# Patient Record
Sex: Male | Born: 1940 | Race: White | Hispanic: No | Marital: Married | Smoking: Former smoker
Health system: Southern US, Community
[De-identification: ages and names within clinical notes are randomized; demographics above are authoritative.]

## PROBLEM LIST (undated history)

## (undated) DIAGNOSIS — K219 Gastro-esophageal reflux disease without esophagitis: Secondary | ICD-10-CM

## (undated) DIAGNOSIS — H3530 Unspecified macular degeneration: Secondary | ICD-10-CM

## (undated) DIAGNOSIS — Z860100 Personal history of colon polyps, unspecified: Secondary | ICD-10-CM

## (undated) DIAGNOSIS — I499 Cardiac arrhythmia, unspecified: Secondary | ICD-10-CM

## (undated) DIAGNOSIS — H547 Unspecified visual loss: Secondary | ICD-10-CM

## (undated) DIAGNOSIS — Z87442 Personal history of urinary calculi: Secondary | ICD-10-CM

## (undated) DIAGNOSIS — E785 Hyperlipidemia, unspecified: Secondary | ICD-10-CM

## (undated) DIAGNOSIS — Z720 Tobacco use: Secondary | ICD-10-CM

## (undated) DIAGNOSIS — I739 Peripheral vascular disease, unspecified: Secondary | ICD-10-CM

## (undated) DIAGNOSIS — I4892 Unspecified atrial flutter: Secondary | ICD-10-CM

## (undated) DIAGNOSIS — N200 Calculus of kidney: Secondary | ICD-10-CM

## (undated) DIAGNOSIS — I679 Cerebrovascular disease, unspecified: Secondary | ICD-10-CM

## (undated) DIAGNOSIS — Z7901 Long term (current) use of anticoagulants: Secondary | ICD-10-CM

## (undated) DIAGNOSIS — I10 Essential (primary) hypertension: Secondary | ICD-10-CM

## (undated) HISTORY — DX: Essential (primary) hypertension: I10

## (undated) HISTORY — PX: CYSTOSCOPY W/ URETERAL STENT PLACEMENT: SHX1429

## (undated) HISTORY — DX: Unspecified atrial flutter: I48.92

## (undated) HISTORY — DX: Cerebrovascular disease, unspecified: I67.9

## (undated) HISTORY — DX: Tobacco use: Z72.0

## (undated) HISTORY — DX: Gastro-esophageal reflux disease without esophagitis: K21.9

## (undated) HISTORY — DX: Long term (current) use of anticoagulants: Z79.01

## (undated) HISTORY — DX: Hyperlipidemia, unspecified: E78.5

## (undated) HISTORY — DX: Unspecified macular degeneration: H35.30

## (undated) HISTORY — DX: Calculus of kidney: N20.0

## (undated) HISTORY — DX: Personal history of colonic polyps: Z86.010

## (undated) HISTORY — DX: Personal history of colon polyps, unspecified: Z86.0100

---

## 2001-02-03 ENCOUNTER — Ambulatory Visit (HOSPITAL_COMMUNITY): Admission: RE | Admit: 2001-02-03 | Discharge: 2001-02-03 | Payer: Self-pay | Admitting: Internal Medicine

## 2002-03-12 ENCOUNTER — Encounter: Payer: Self-pay | Admitting: *Deleted

## 2002-03-12 ENCOUNTER — Encounter: Payer: Self-pay | Admitting: Urology

## 2002-03-12 ENCOUNTER — Emergency Department (HOSPITAL_COMMUNITY): Admission: EM | Admit: 2002-03-12 | Discharge: 2002-03-12 | Payer: Self-pay | Admitting: *Deleted

## 2002-03-12 ENCOUNTER — Ambulatory Visit (HOSPITAL_COMMUNITY): Admission: RE | Admit: 2002-03-12 | Discharge: 2002-03-12 | Payer: Self-pay | Admitting: Urology

## 2002-06-02 ENCOUNTER — Emergency Department (HOSPITAL_COMMUNITY): Admission: EM | Admit: 2002-06-02 | Discharge: 2002-06-02 | Payer: Self-pay | Admitting: Emergency Medicine

## 2002-06-02 ENCOUNTER — Encounter: Payer: Self-pay | Admitting: Emergency Medicine

## 2002-07-16 ENCOUNTER — Encounter: Payer: Self-pay | Admitting: Urology

## 2002-07-16 ENCOUNTER — Ambulatory Visit (HOSPITAL_COMMUNITY): Admission: RE | Admit: 2002-07-16 | Discharge: 2002-07-16 | Payer: Self-pay | Admitting: Urology

## 2002-07-27 ENCOUNTER — Emergency Department (HOSPITAL_COMMUNITY): Admission: EM | Admit: 2002-07-27 | Discharge: 2002-07-27 | Payer: Self-pay | Admitting: Emergency Medicine

## 2002-08-06 ENCOUNTER — Ambulatory Visit (HOSPITAL_COMMUNITY): Admission: RE | Admit: 2002-08-06 | Discharge: 2002-08-06 | Payer: Self-pay | Admitting: Urology

## 2002-08-06 ENCOUNTER — Encounter: Payer: Self-pay | Admitting: Urology

## 2002-08-11 ENCOUNTER — Encounter: Payer: Self-pay | Admitting: Urology

## 2002-08-11 ENCOUNTER — Ambulatory Visit (HOSPITAL_COMMUNITY): Admission: RE | Admit: 2002-08-11 | Discharge: 2002-08-11 | Payer: Self-pay | Admitting: Urology

## 2002-08-12 ENCOUNTER — Ambulatory Visit (HOSPITAL_COMMUNITY): Admission: RE | Admit: 2002-08-12 | Discharge: 2002-08-12 | Payer: Self-pay | Admitting: Urology

## 2002-08-12 ENCOUNTER — Encounter: Payer: Self-pay | Admitting: Urology

## 2002-09-08 ENCOUNTER — Ambulatory Visit (HOSPITAL_COMMUNITY): Admission: RE | Admit: 2002-09-08 | Discharge: 2002-09-08 | Payer: Self-pay | Admitting: Urology

## 2002-09-08 ENCOUNTER — Encounter: Payer: Self-pay | Admitting: Urology

## 2002-09-22 ENCOUNTER — Encounter: Payer: Self-pay | Admitting: Urology

## 2002-09-22 ENCOUNTER — Ambulatory Visit (HOSPITAL_COMMUNITY): Admission: RE | Admit: 2002-09-22 | Discharge: 2002-09-22 | Payer: Self-pay | Admitting: Urology

## 2002-11-10 ENCOUNTER — Observation Stay (HOSPITAL_COMMUNITY): Admission: EM | Admit: 2002-11-10 | Discharge: 2002-11-11 | Payer: Self-pay | Admitting: Emergency Medicine

## 2002-11-10 ENCOUNTER — Encounter: Payer: Self-pay | Admitting: Emergency Medicine

## 2002-11-11 ENCOUNTER — Encounter: Payer: Self-pay | Admitting: *Deleted

## 2002-12-03 ENCOUNTER — Ambulatory Visit (HOSPITAL_COMMUNITY): Admission: RE | Admit: 2002-12-03 | Discharge: 2002-12-03 | Payer: Self-pay | Admitting: Cardiology

## 2003-12-30 ENCOUNTER — Ambulatory Visit (HOSPITAL_COMMUNITY): Admission: RE | Admit: 2003-12-30 | Discharge: 2003-12-30 | Payer: Self-pay | Admitting: Urology

## 2004-01-04 ENCOUNTER — Ambulatory Visit (HOSPITAL_COMMUNITY): Admission: RE | Admit: 2004-01-04 | Discharge: 2004-01-04 | Payer: Self-pay | Admitting: Family Medicine

## 2004-01-17 ENCOUNTER — Ambulatory Visit (HOSPITAL_COMMUNITY): Admission: RE | Admit: 2004-01-17 | Discharge: 2004-01-17 | Payer: Self-pay | Admitting: Family Medicine

## 2004-08-09 ENCOUNTER — Ambulatory Visit (HOSPITAL_COMMUNITY): Admission: RE | Admit: 2004-08-09 | Discharge: 2004-08-09 | Payer: Self-pay | Admitting: Urology

## 2005-01-07 ENCOUNTER — Ambulatory Visit: Payer: Self-pay | Admitting: Cardiology

## 2005-10-04 ENCOUNTER — Ambulatory Visit (HOSPITAL_COMMUNITY): Admission: RE | Admit: 2005-10-04 | Discharge: 2005-10-04 | Payer: Self-pay | Admitting: Urology

## 2006-02-05 ENCOUNTER — Ambulatory Visit (HOSPITAL_COMMUNITY): Admission: RE | Admit: 2006-02-05 | Discharge: 2006-02-05 | Payer: Self-pay | Admitting: Family Medicine

## 2006-02-11 ENCOUNTER — Ambulatory Visit (HOSPITAL_COMMUNITY): Admission: RE | Admit: 2006-02-11 | Discharge: 2006-02-11 | Payer: Self-pay | Admitting: Internal Medicine

## 2006-02-11 ENCOUNTER — Encounter (INDEPENDENT_AMBULATORY_CARE_PROVIDER_SITE_OTHER): Payer: Self-pay | Admitting: *Deleted

## 2006-02-11 ENCOUNTER — Ambulatory Visit: Payer: Self-pay | Admitting: Internal Medicine

## 2006-03-04 DIAGNOSIS — I679 Cerebrovascular disease, unspecified: Secondary | ICD-10-CM

## 2006-03-04 HISTORY — PX: CAROTID ENDARTERECTOMY: SUR193

## 2006-03-04 HISTORY — DX: Cerebrovascular disease, unspecified: I67.9

## 2006-05-13 ENCOUNTER — Ambulatory Visit: Payer: Self-pay | Admitting: Vascular Surgery

## 2006-05-13 ENCOUNTER — Inpatient Hospital Stay (HOSPITAL_COMMUNITY): Admission: AD | Admit: 2006-05-13 | Discharge: 2006-05-16 | Payer: Self-pay | Admitting: Cardiovascular Disease

## 2006-05-14 ENCOUNTER — Encounter (INDEPENDENT_AMBULATORY_CARE_PROVIDER_SITE_OTHER): Payer: Self-pay | Admitting: Specialist

## 2006-06-04 ENCOUNTER — Ambulatory Visit: Payer: Self-pay | Admitting: Vascular Surgery

## 2006-10-30 ENCOUNTER — Ambulatory Visit (HOSPITAL_COMMUNITY): Admission: RE | Admit: 2006-10-30 | Discharge: 2006-10-30 | Payer: Self-pay | Admitting: Family Medicine

## 2006-12-02 ENCOUNTER — Ambulatory Visit: Payer: Self-pay | Admitting: Vascular Surgery

## 2007-09-02 ENCOUNTER — Ambulatory Visit (HOSPITAL_COMMUNITY): Admission: RE | Admit: 2007-09-02 | Discharge: 2007-09-02 | Payer: Self-pay | Admitting: Family Medicine

## 2009-02-23 ENCOUNTER — Ambulatory Visit (HOSPITAL_COMMUNITY): Admission: RE | Admit: 2009-02-23 | Discharge: 2009-02-23 | Payer: Self-pay | Admitting: Family Medicine

## 2010-03-19 ENCOUNTER — Ambulatory Visit (HOSPITAL_COMMUNITY)
Admission: RE | Admit: 2010-03-19 | Discharge: 2010-03-19 | Payer: Self-pay | Source: Home / Self Care | Attending: Family Medicine | Admitting: Family Medicine

## 2010-06-03 DIAGNOSIS — I4892 Unspecified atrial flutter: Secondary | ICD-10-CM

## 2010-06-03 HISTORY — DX: Unspecified atrial flutter: I48.92

## 2010-06-22 ENCOUNTER — Inpatient Hospital Stay (HOSPITAL_COMMUNITY)
Admission: EM | Admit: 2010-06-22 | Discharge: 2010-06-23 | DRG: 138 | Disposition: A | Discharge status: Home / Self Care | Payer: BLUE CROSS/BLUE SHIELD | Attending: Internal Medicine | Admitting: Internal Medicine

## 2010-06-22 ENCOUNTER — Emergency Department (HOSPITAL_COMMUNITY): Payer: BLUE CROSS/BLUE SHIELD

## 2010-06-22 ENCOUNTER — Encounter: Payer: Self-pay | Admitting: Cardiology

## 2010-06-22 DIAGNOSIS — F17200 Nicotine dependence, unspecified, uncomplicated: Secondary | ICD-10-CM | POA: Diagnosis present

## 2010-06-22 DIAGNOSIS — Z7901 Long term (current) use of anticoagulants: Secondary | ICD-10-CM

## 2010-06-22 DIAGNOSIS — E876 Hypokalemia: Secondary | ICD-10-CM | POA: Diagnosis present

## 2010-06-22 DIAGNOSIS — I9589 Other hypotension: Secondary | ICD-10-CM | POA: Diagnosis present

## 2010-06-22 DIAGNOSIS — I4891 Unspecified atrial fibrillation: Secondary | ICD-10-CM

## 2010-06-22 DIAGNOSIS — I6529 Occlusion and stenosis of unspecified carotid artery: Secondary | ICD-10-CM | POA: Diagnosis present

## 2010-06-22 DIAGNOSIS — Z8673 Personal history of transient ischemic attack (TIA), and cerebral infarction without residual deficits: Secondary | ICD-10-CM

## 2010-06-22 DIAGNOSIS — I10 Essential (primary) hypertension: Secondary | ICD-10-CM | POA: Diagnosis present

## 2010-06-22 DIAGNOSIS — E860 Dehydration: Secondary | ICD-10-CM | POA: Diagnosis present

## 2010-06-22 DIAGNOSIS — I4892 Unspecified atrial flutter: Secondary | ICD-10-CM | POA: Diagnosis present

## 2010-06-22 DIAGNOSIS — T46905A Adverse effect of unspecified agents primarily affecting the cardiovascular system, initial encounter: Secondary | ICD-10-CM | POA: Diagnosis present

## 2010-06-22 DIAGNOSIS — H3530 Unspecified macular degeneration: Secondary | ICD-10-CM | POA: Diagnosis present

## 2010-06-22 DIAGNOSIS — E785 Hyperlipidemia, unspecified: Secondary | ICD-10-CM | POA: Diagnosis present

## 2010-06-22 LAB — DIFFERENTIAL
Basophils Absolute: 0 10*3/uL (ref 0.0–0.1)
Eosinophils Relative: 2 % (ref 0–5)
Lymphocytes Relative: 37 % (ref 12–46)
Lymphs Abs: 3.3 10*3/uL (ref 0.7–4.0)
Monocytes Absolute: 0.6 10*3/uL (ref 0.1–1.0)
Neutro Abs: 4.9 10*3/uL (ref 1.7–7.7)
Neutrophils Relative %: 55 % (ref 43–77)

## 2010-06-22 LAB — CARDIAC PANEL(CRET KIN+CKTOT+MB+TROPI)
Relative Index: INVALID (ref 0.0–2.5)
Total CK: 86 U/L (ref 7–232)
Troponin I: 0.01 ng/mL (ref 0.00–0.06)

## 2010-06-22 LAB — CBC
HCT: 40.6 % (ref 39.0–52.0)
Hemoglobin: 14.1 g/dL (ref 13.0–17.0)
MCH: 32.3 pg (ref 26.0–34.0)
MCHC: 34.7 g/dL (ref 30.0–36.0)
MCV: 93.1 fL (ref 78.0–100.0)
RDW: 14.1 % (ref 11.5–15.5)
WBC: 9 10*3/uL (ref 4.0–10.5)

## 2010-06-22 LAB — BASIC METABOLIC PANEL
BUN: 26 mg/dL — ABNORMAL HIGH (ref 6–23)
CO2: 26 mEq/L (ref 19–32)
Calcium: 8.9 mg/dL (ref 8.4–10.5)
Chloride: 106 mEq/L (ref 96–112)
GFR calc Af Amer: >60 mL/min (ref >60)
GFR calc non Af Amer: >60 mL/min (ref >60)
Glucose, Bld: 94 mg/dL (ref 70–99)
Potassium: 3.1 mEq/L — ABNORMAL LOW (ref 3.5–5.1)
Sodium: 138 mEq/L (ref 135–145)

## 2010-06-22 LAB — POCT CARDIAC MARKERS
CKMB, poc: <1 ng/mL — ABNORMAL LOW (ref 1.0–8.0)
Myoglobin, poc: 69.4 ng/mL (ref 12–200)

## 2010-06-23 LAB — CARDIAC PANEL(CRET KIN+CKTOT+MB+TROPI)
CK, MB: 1.4 ng/mL (ref 0.3–4.0)
Relative Index: 1.3 (ref 0.0–2.5)
Troponin I: 0.01 ng/mL (ref 0.00–0.06)
Troponin I: 0.02 ng/mL (ref 0.00–0.06)

## 2010-06-23 LAB — COMPREHENSIVE METABOLIC PANEL
Albumin: 3.3 g/dL — ABNORMAL LOW (ref 3.5–5.2)
BUN: 21 mg/dL (ref 6–23)
Calcium: 8.8 mg/dL (ref 8.4–10.5)
Glucose, Bld: 88 mg/dL (ref 70–99)
Potassium: 3.6 mEq/L (ref 3.5–5.1)
Sodium: 138 mEq/L (ref 135–145)
Total Protein: 5.7 g/dL — ABNORMAL LOW (ref 6.0–8.3)

## 2010-06-23 LAB — CBC
Hemoglobin: 13.1 g/dL (ref 13.0–17.0)
MCH: 32.3 pg (ref 26.0–34.0)
MCHC: 34.4 g/dL (ref 30.0–36.0)
Platelets: 162 10*3/uL (ref 150–400)
RBC: 4.05 MIL/uL — ABNORMAL LOW (ref 4.22–5.81)

## 2010-06-23 LAB — DIFFERENTIAL
Basophils Absolute: 0 10*3/uL (ref 0.0–0.1)
Basophils Relative: 0 % (ref 0–1)
Eosinophils Absolute: 0.2 10*3/uL (ref 0.0–0.7)
Monocytes Relative: 5 % (ref 3–12)
Neutro Abs: 4.1 10*3/uL (ref 1.7–7.7)
Neutrophils Relative %: 43 % (ref 43–77)

## 2010-06-23 LAB — LIPID PANEL
HDL: 26 mg/dL — ABNORMAL LOW (ref >39)
Total CHOL/HDL Ratio: 8 RATIO
Triglycerides: 261 mg/dL — ABNORMAL HIGH (ref <150)
VLDL: 52 mg/dL — ABNORMAL HIGH (ref 0–40)

## 2010-06-23 LAB — PROTIME-INR
INR: 1.07 (ref 0.00–1.49)
Prothrombin Time: 14.1 seconds (ref 11.6–15.2)

## 2010-06-23 LAB — TSH: TSH: 5.684 u[IU]/mL — ABNORMAL HIGH (ref 0.350–4.500)

## 2010-06-23 LAB — MAGNESIUM: Magnesium: 1.8 mg/dL (ref 1.5–2.5)

## 2010-06-23 NOTE — H&P (Signed)
NAME:  Daniel Morse, Daniel Morse             ACCOUNT NO.:  [id number]  MEDICAL RECORD NO.:  [id number]           PATIENT TYPE:  I  LOCATION:  A327                          FACILITY:  [facility]  PHYSICIAN:  Doree Albee, M.D.DATE OF BIRTH:  08-25-1940  DATE OF ADMISSION:  06/22/2010 DATE OF DISCHARGE:  04/20/2012LH                             HISTORY & PHYSICAL   CHIEF COMPLAINT:  Palpitations for 2 weeks.  HISTORY OF PRESENT ILLNESS:  This very pleasant 70 year old man came to the emergency room after being referred by his physician from his office.  He went to this physician because he has been having palpitations for the last 2 weeks or so.  He denied any dizziness, lightheadedness, dyspnea, or chest pain with these episodes.  Dr. Hilma Favors, his primary care physician, sent him to the ER and when he arrived to the ER, he was found to be in a narrow-complex tachycardia, probably atrial flutter versus atrial fibrillation.  He was started on intravenous Cardizem drip which did help slow his ventricular rate, but he became hypotensive.  He then had to receive fluids to volume resuscitate him.  During all this time, he has really not felt lightheaded or dizzy and he feels perfectly well at the present time. He does not have a history of coronary artery disease.  He does have a history of carotid artery disease having had a left carotid endarterectomy in 2008.  He has no history of stroke.  PAST MEDICAL HISTORY: 1. Hypertension. 2. Hyperlipidemia. 3. Carotid artery disease as mentioned above. 4. Macular degeneration.  MEDICATIONS: 1. Lisinopril 20 mg b.i.d. 2. Aspirin 81 mg daily. 3. He was on Lipitor until approximately 6 weeks ago when he stopped     it as he was unable to tolerate it.  Prior to that, he was     apparently on Crestor.  ALLERGIES:  None.  SOCIAL HISTORY:  He has been married for 52 years and he continues to smoke just under one pack of cigarettes per day and has been  smoking for 50 years.  He occasionally drinks alcohol.  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  FAMILY HISTORY:  His own daughter has history of supraventricular tachycardia which required ablation therapy by Dr. Jolyn Nap in [place].  REVIEW OF SYSTEMS:  Apart from the symptoms mentioned above, there are no other symptoms referable to all systems reviewed.  PHYSICAL EXAMINATION:  VITAL SIGNS:  Temperature 98.4, blood pressure currently holding at 90 systolic, pulse varying between 90 and 130 and appears to be in atrial flutter, respiratory rate 12-14, saturation 94% on room air. GENERAL:  He looks perfectly well, and he does not have increased work of breathing.  There is no peripheral or central cyanosis. CARDIA:  Heart sounds are present and irregular.  Jugular venous pressure is not raised.  There are no carotid bruits. RESPIRATORY:  Lung fields are entirely clear. ABDOMEN:  Soft and nontender with no hepatosplenomegaly. NEUROLOGIC:  He is alert and oriented without any focal neurologic signs. SKIN:  There are no skin lesions or  rashes.  INVESTIGATIONS:  Electrocardiogram shows atrial flutter with a 4:1 block with a ventricular rate of 77 done at 12:30 today p.m. Chest x-ray is unremarkable. Hemoglobin 14.1, white blood cell count 9.0, platelets 184.  Sodium 138, potassium 3.1 (which is being repleted), chloride 106, bicarbonate 26, glucose 94, BUN 26, creatinine 1.15.  Initial troponin is less than 0.05.  PROBLEM LIST: 1. Atrial flutter with rapid ventricular response. 2. Hypertension, currently hypotensive. 3. Carotid artery disease. 4. Hyperlipidemia.  PLAN: 1. Admit to telemetry. 2. Cardiac enzymes and ECGs serially. 3. Echocardiogram. 4. Cardiology consultation. 5. Anticoagulation.  I think that in view of his history of carotid artery disease, history of hypertension, and hyperlipidemia,  he is at increased risk for stroke with now atrial flutter/fibrillation.  Therefore, I think anticoagulation is appropriate at this time.  I will await further recommendations from Cardiology regarding this also.  I think he would be a candidate for amiodarone but again, I will await Cardiology recommendations.     Doree Albee, M.D.     NCG/MEDQ  D:  06/22/2010  T:  06/23/2010  Job:  [id number]  cc:   Halford Chessman, M.D. Fax: [phone]  Electronically Signed by Hurshel Party M.D. on 06/23/2010 12:50:36 PM

## 2010-06-23 NOTE — Discharge Summary (Signed)
  NAME:  Daniel Morse, Daniel Morse             ACCOUNT NO.:  [id number]  MEDICAL RECORD NO.:  [id number]           PATIENT TYPE:  I  LOCATION:  A327                          FACILITY:  [facility]  PHYSICIAN:  Doree Albee, M.D.DATE OF BIRTH:  21-Nov-1940  DATE OF ADMISSION:  06/22/2010 DATE OF DISCHARGE:  04/21/2012LH                         DISCHARGE SUMMARY-REFERRING   FINAL DISCHARGE DIAGNOSES: 1. Atrial flutter/fibrillation, now rate controlled. 2. Anticoagulation. 3. Hypertension. 4. Hyperlipidemia. 5. Symptomatic left carotid artery stenosis with transient ischemic     attacks, status post left carotid endarterectomy. 6. Macular degeneration.  CONDITION ON DISCHARGE:  Stable.  MEDICATIONS ON DISCHARGE: 1. Lovenox 100 mg b.i.d. 2. Warfarin 7.5 mg daily and adjust according to INR. 3. Digoxin 0.25 mg daily. 4. Metoprolol 25 mg b.i.d.  Stop Lisinopril.  Stop Aspirin.  HISTORY OF PRESENT ILLNESS:  This very pleasant 70 year old man was admitted with palpitations ongoing for 2 weeks.  Please see initial history and physical examination done by Dr. Hurshel Party.  HOSPITAL PROGRESS:  The patient was found to be in atrial flutter with rapid ventricular response.  He was given intravenous Cardizem drip in the emergency room, but became hypotensive and Cardiology saw this patient who then started him on a combination of oral digoxin and metoprolol.  Serial cardiac enzymes were done and these were negative for myocardial ischemia or infarction.  Today, he looks well, he wants to go home, and he does not have any palpitations.  PHYSICAL EXAMINATION:  VITAL SIGNS:  Temperature 97.7, blood pressure 109/76, pulse 75 and irregular, and saturation 97% on room air. HEART:  Heart sounds are present and irregular, consistent with atrial fibrillation or flutter. LUNGS:  Lung fields are clear. NEUROLOGICAL:  He is well without any focal neurologic signs.  Investigations today show sodium 138,  potassium 3.6, bicarbonate 24, BUN 21, creatinine 1.2, hemoglobin 13.1, white blood cell count 9.6, platelets 162, INR is only 1.07.  Troponin had been 0.01, 0.01, and 0.02.  DISPOSITION:  I have discussed this patient with Dr. Candee Furbish, Cardiology [place] who feels that in view of all of his risk factors especially including symptomatic carotid artery stenosis with a TIA in the past that he qualifies for being on anticoagulation.  Because of this, I have given him a prescription for Lovenox injections to be taken until his INR is therapeutic with warfarin.  He will follow up with Dr. Lattie Haw, Cardiology early next week in the office.     Doree Albee, M.D.     NCG/MEDQ  D:  06/23/2010  T:  06/23/2010  Job:  [id number]  cc:   Cristopher Estimable. Lattie Haw, MD, [facility] [address] [place], [place] [address]  Halford Chessman, M.D. Fax: [phone]  Electronically Signed by Hurshel Party M.D. on 06/23/2010 12:51:20 PM

## 2010-06-27 ENCOUNTER — Telehealth: Payer: Self-pay | Admitting: *Deleted

## 2010-06-27 ENCOUNTER — Ambulatory Visit (INDEPENDENT_AMBULATORY_CARE_PROVIDER_SITE_OTHER): Payer: BLUE CROSS/BLUE SHIELD | Admitting: *Deleted

## 2010-06-27 DIAGNOSIS — Z7901 Long term (current) use of anticoagulants: Secondary | ICD-10-CM

## 2010-06-27 DIAGNOSIS — G450 Vertebro-basilar artery syndrome: Secondary | ICD-10-CM

## 2010-06-27 DIAGNOSIS — I4891 Unspecified atrial fibrillation: Secondary | ICD-10-CM

## 2010-06-27 LAB — POCT INR: INR: 1.9

## 2010-06-28 NOTE — Telephone Encounter (Signed)
Please refer this to Dr. Lattie Haw, as he placed this patient on the digoxin and metoprolol.  Neither dig or metoprolol has been found to cause insomnia.  Jory Sims NP

## 2010-06-28 NOTE — Telephone Encounter (Signed)
S: Medication induced insomnia B: pt was started on metoprolol tartrate and digoxin on 06/22/10 A: pt's normal sleep pattern is 8-9hrs nightly, now he only gets an average of 3-4 hrs of sleep a night if he is lucky, only takes an hour nap during the day that has just started since he started on the medication. He stated he could not keep going on like this he must have his rest.   R: Do you recommend anything else with less side effects???

## 2010-07-03 DIAGNOSIS — I4891 Unspecified atrial fibrillation: Secondary | ICD-10-CM | POA: Insufficient documentation

## 2010-07-03 DIAGNOSIS — I4892 Unspecified atrial flutter: Secondary | ICD-10-CM

## 2010-07-03 DIAGNOSIS — K219 Gastro-esophageal reflux disease without esophagitis: Secondary | ICD-10-CM | POA: Insufficient documentation

## 2010-07-03 DIAGNOSIS — R002 Palpitations: Secondary | ICD-10-CM

## 2010-07-03 DIAGNOSIS — I10 Essential (primary) hypertension: Secondary | ICD-10-CM | POA: Insufficient documentation

## 2010-07-03 DIAGNOSIS — E785 Hyperlipidemia, unspecified: Secondary | ICD-10-CM | POA: Insufficient documentation

## 2010-07-04 ENCOUNTER — Encounter: Payer: Self-pay | Admitting: Cardiology

## 2010-07-04 ENCOUNTER — Ambulatory Visit (INDEPENDENT_AMBULATORY_CARE_PROVIDER_SITE_OTHER): Payer: BLUE CROSS/BLUE SHIELD | Admitting: Cardiology

## 2010-07-04 ENCOUNTER — Ambulatory Visit (INDEPENDENT_AMBULATORY_CARE_PROVIDER_SITE_OTHER): Payer: Medicare Other | Admitting: *Deleted

## 2010-07-04 VITALS — BP 144/92 | HR 80 | Ht 66.0 in | Wt 197.0 lb

## 2010-07-04 DIAGNOSIS — Z7901 Long term (current) use of anticoagulants: Secondary | ICD-10-CM

## 2010-07-04 DIAGNOSIS — I4892 Unspecified atrial flutter: Secondary | ICD-10-CM

## 2010-07-04 DIAGNOSIS — F17200 Nicotine dependence, unspecified, uncomplicated: Secondary | ICD-10-CM

## 2010-07-04 DIAGNOSIS — I679 Cerebrovascular disease, unspecified: Secondary | ICD-10-CM

## 2010-07-04 DIAGNOSIS — I4891 Unspecified atrial fibrillation: Secondary | ICD-10-CM

## 2010-07-04 DIAGNOSIS — N200 Calculus of kidney: Secondary | ICD-10-CM

## 2010-07-04 DIAGNOSIS — G450 Vertebro-basilar artery syndrome: Secondary | ICD-10-CM

## 2010-07-04 DIAGNOSIS — Z720 Tobacco use: Secondary | ICD-10-CM | POA: Insufficient documentation

## 2010-07-04 MED ORDER — METOPROLOL TARTRATE 25 MG PO TABS: 25.0000 mg | ORAL_TABLET | Freq: Three times a day (TID) | ORAL | Status: DC

## 2010-07-04 MED ORDER — METOPROLOL TARTRATE 25 MG PO TABS: 25.0000 mg | ORAL_TABLET | Freq: Two times a day (BID) | ORAL | Status: DC

## 2010-07-04 NOTE — Assessment & Plan Note (Signed)
Patient is minimally symptomatic despite a good performance status at a reasonably high level of exertion.  Radiofrequency ablation was explained to him with the potential benefit of being able to discontinue anticoagulation indefinitely.  For the present, and he would like to continue medical management.

## 2010-07-04 NOTE — Assessment & Plan Note (Signed)
Recent CBC was normal; stool will be obtained for Hemoccult testing to exclude occult GI blood loss.  Warfarin dosage will continue to be adjusted in Anticoagulation Clinic.

## 2010-07-04 NOTE — Assessment & Plan Note (Signed)
Patient is attempting to taper cigarette consumption, which he has decreased by 50% over the past few weeks.  He is encouraged to continue, but probably ultimately will require some sort of intervention.

## 2010-07-04 NOTE — Progress Notes (Signed)
HPI : [name] returns to the office after a recent hospital admission for the new onset of atrial flutter with a rapid ventricular response.  He responded well to medication to control heart rate, but blood pressure was at times on the low side.  ACE Inhibitor, which had previously been used for hypertension, was discontinued as was aspirin.  Since discharge, he has done well with warfarin without any apparent adverse effects.  He notes palpitations typically occurring at the end of his dosing intervals of metoprolol and with moderate exertion.  These resolve quickly.  He denies chest discomfort, dyspnea, orthopnea, PND, lightheadedness and syncope.  Is noted no emesis, hematemesis or melena.  Current Outpatient Prescriptions on File Prior to Visit  Medication Sig Dispense Refill  . digoxin (LANOXIN) 0.25 MG tablet Take 1 mcg by mouth daily.       . fenofibrate 160 MG tablet Take 160 mg by mouth daily.       Marland Kitchen omeprazole (PRILOSEC) 20 MG capsule Take 20 mg by mouth 2 (two) times daily.       . metoprolol tartrate (LOPRESSOR) 25 MG tablet Take 1 tablet (25 mg total) by mouth 3 (three) times daily.  270 tablet  1  . DISCONTD: aspirin 81 MG tablet Take 81 mg by mouth daily.        Marland Kitchen DISCONTD: atorvastatin (LIPITOR) 40 MG tablet Take 40 mg by mouth daily.        Marland Kitchen DISCONTD: enoxaparin (LOVENOX) 100 MG/ML SOLN 1 mg daily.       Marland Kitchen DISCONTD: lisinopril-hydrochlorothiazide (PRINZIDE) 20-12.5 MG per tablet Take 1 tablet by mouth daily.           Allergies  Allergen Reactions  . Simvastatin Other (See Comments)    Severe myalgias      Past medical history, social history, and family history reviewed and updated.  ROS: See history of present illness.  PHYSICAL EXAM BP 144/92  Pulse 80  Ht $R'5\' 6"'Oa$  (1.676 m)  Wt 197 lb (89.359 kg)  BMI 31.80 kg/m2  SpO2 92%  General-Well developed; no acute distress Body habitus-mildly to moderately overweight Neck-No JVD, no carotid bruits Lungs: clear lung  fields; normal I:E ratio Cardiovascular-normal PMI; irregular rhythm; normal S1 and S2 Abdomen-normal bowel sounds; soft and non-tender without masses or organomegaly Skin-Warm, no significant lesions Extremities-Nl distal pulses; trace edema  EKG: atrial flutter with predominantly 4-1 AV block and a heart rate of 90 bpm; occasional PVC; left axis deviation; low voltage; delayed R wave progression-cannot exclude previous anterior MI; comparison to prior tracing of 06/22/10, no significant interval change.  ASSESSMENT AND PLAN:

## 2010-07-04 NOTE — Patient Instructions (Addendum)
  Your physician recommends that you schedule a follow-up appointment in: 3 months Your physician has recommended you make the following change in your medication:increase metoprolol to $RemoveBefor'25mg'isnRORYibjUW$  three times daily Stool cards: please follow directions in packet

## 2010-07-07 NOTE — Consult Note (Signed)
NAME:  Daniel Morse, Daniel Morse             ACCOUNT NO.:  [id number]  MEDICAL RECORD NO.:  [id number]           PATIENT TYPE:  I  LOCATION:  A327                          FACILITY:  [facility]  PHYSICIAN:  Cristopher Estimable. Lattie Haw, MD, FACCDATE OF BIRTH:  1940-10-16  DATE OF CONSULTATION:  06/22/2010 DATE OF DISCHARGE:  06/22/2010                                CONSULTATION   PRIMARY CARDIOLOGIST:  Formally Dr. Rollene Fare, will now be Dr. Jacqulyn Ducking.  PRIMARY CARE PHYSICIAN:  Sherrilee Gilles. Gerarda Fraction, MD  REASON FOR CONSULTATION:  AFib flutter.  HISTORY OF PRESENT ILLNESS:  This is a 70 year old Caucasian male without prior history of atrial fib or flutter with known history of hypertension, hypercholesterolemia, and carotid artery disease status post left carotid endarterectomy in 2008, who was admitted from Dr. Nolon Rod office with complaints of "fluttering in his chest" on and off for the last 2 weeks.  The patient initially went to see Dr. Gerarda Fraction to have a refill on his anti-hypercholesterolemia medicine.  The patient mentioned that he had been having some fluttering in his chest, was examined by Dr. Gerarda Fraction, and was found to have a rapid heart rate that was irregular.  In the emergency room, the patient was found to be in atrial fib/flutter with a rate of 146 beats per minute with variable block going as high as 160 beats per minute.  The patient has had some associated shortness of breath and dizziness.  He says he notices mostly when he is bending over and then stands back up with some associated diaphoresis.  He states that this flutter has been occurring on and off for the last 2 weeks without associated chest pain, but he does have some mild shortness of breath.  The patient was given an IV Cardizem bolus at 10 mg and started on a drip that and had a hypotensive response with blood pressures in the 38G systolic.  The patient was given IV fluids and Cardizem has been removed.  We are asked by the  hospitalist service, Dr. Anastasio Champion for recommendations and assistance in heart rate control in this setting.  Of note, the patient was also found to be hypokalemic with a potassium of 3.1.  This was repleted with p.o. potassium and will be monitored.  REVIEW OF SYSTEMS:  Positive for diaphoresis, shortness of breath, some dyspnea on exertion, palpitations over the last 2 weeks, mild dizziness when bending over and standing up.  He denies any other symptoms to include nausea, near syncope, or dizziness.  All other systems are reviewed and are found to be negative unless listed above.  Code status is full.  PAST MEDICAL HISTORY: 1. Hypertension. 2. Nephrolithiasis. 3. Carotid artery disease on the left status post Dacron patch in     2008. 4. Hyperlipidemia. 5. GERD. 6. Macular degeneration. 7. Cervical, thoracic, and lumbar spine disease. 8. Most recent stress test was in 2008, was found to be negative for     evidence of ischemia.  PAST SURGICAL HISTORY:  Carotid endarterectomy in 2008.  SOCIAL HISTORY:  He lives in [place] with his wife.  He is retired. He is a 60-pack-year  smoker with ongoing use.  He drinks vodka occasionally.  Negative for drug use.  FAMILY HISTORY:  Mother with CVA and irregular heart rate and hypothyroidism.  Father deceased from an MI.  He has one brother who had an MI at age 64 from rheumatic heart disease.  MEDICATIONS PRIOR TO ADMISSION: 1. Lisinopril 20 mg b.i.d. 2. Aspirin 81 mg daily. 3. ICaps daily. 4. Fish oil tablets of 1000 mg each. 5. Cholesterol medications that he does not know the name of. 6. Omeprazole 20 mg daily.  ALLERGIES:  No known drug allergies.  CURRENT LABORATORY DATA:  Sodium 138, potassium 3.1 (this is being repleted), chloride 106, CO2 of 26, BUN 26, creatinine 1.1, and glucose 94.  Hemoglobin 14.1, hematocrit 40.6, white blood cells 9.0, and platelets 184.  Troponin less than 0.05.  Calcium 8.9.  EKG revealing  atrial flutter with variable block 2:1 and 3:1 with rates between 70-160 beats per minute.  Chest x-ray no acute abnormalities.  PHYSICAL EXAMINATION:  VITAL SIGNS:  Blood pressure currently 83/56, pulse 77, respirations 16, temperature 98.3, and O2 saturation 94% on room air. GENERAL:  He is awake, alert, and oriented.  No acute distress. HEENT:  Head is normocephalic and atraumatic. NECK:  Supple without JVD, carotid bruits, or thyromegaly. CARDIOVASCULAR:  Distant heart sounds, irregular and rapid without rubs or gallops.  Pulses are 2+ and equal without bruits. LUNGS:  Clear to auscultation without wheezes, rales, or rhonchi. ABDOMEN:  Obese and nontender with 2+ bowel sounds. EXTREMITIES:  Without clubbing, cyanosis, or edema. MUSCULOSKELETAL:  Chronic neck and back soreness with movement. NEUROLOGIC:  Cranial nerves II through XII are grossly intact.  IMPRESSION: 1. New-onset atrial fibrillation/flutter, rates up to 156 beats per     minute, could not tolerate Cardizem with hypotensive response.  We     will begin digoxin per Dr. Izell [place] preference. He is still hypotensive.     Echocardiogram to be completed during this admission.  Mali score     is 2 with hypertension and age. 2. History of hypertension, on lisinopril 20 mg b.i.d. unknown     "normal" blood pressure on this medication.  We will continue to     monitor. 3. Hypocholesterolemia.  We will check fasting levels in the a.m. 4. Tobacco abuse.  Cessation is recommended.  PLAN:  This is a 70 year old Caucasian male without prior history of atrial fib or flutter who presents to the Dr. Nolon Rod office for complaints of palpitations, found to be in atrial fib with a rate of 150 beats per minute, given IV Cardizem with hypotensive response, now on atrial flutter with 2:1 and 3:1 block with rates up to 160 beats per minute.  Will start digoxin in lieu of amiodarone at Dr. Izell  preference.  He hasalready been  placed on Coumadin and Lovenox per primary  care.  Thepatient will be monitored closely for response.  He will also be continued on IV fluids as he does appear to be mildly dehydrated by creatinine.  He states that he has been working outside a lot and enjoys and has been sweating a lot.  We will check a magnesium as he was hypokalemic to evaluate that status.  He will also have tobacco cessation.  We will make further recommendations throughout hospital course with possible need for EP evaluation should amiodarone not allow for the patient to return to normal sinus rhythm.  The patient also has multiple cardiovascular risk factors.  Currently, his enzymes are negative.  We will continue to cycle these to evaluate for need for further cardiac work to include a catheterization versus stress test if positive.  On behalf the physicians and providers of [place], we would like to thank [facility] and Dr. Gerarda Fraction for allowing [place] to participate in the care of this patient.     Phill Myron. Purcell Nails, NP   ______________________________ Cristopher Estimable. Lattie Haw, MD, [facility]    KML/MEDQ  D:  06/22/2010  T:  06/23/2010  Job:  [id number]  cc:   Sherrilee Gilles. Gerarda Fraction, MD Fax: [phone]  Electronically Signed by Jory Sims NP on 06/25/2010 08:10:05 AM Electronically Signed by Jacqulyn Ducking MD [facility] [address] on 07/07/2010 10:23:13 PM

## 2010-07-09 ENCOUNTER — Encounter (INDEPENDENT_AMBULATORY_CARE_PROVIDER_SITE_OTHER): Payer: Medicare Other

## 2010-07-09 ENCOUNTER — Encounter: Payer: Self-pay | Admitting: Cardiology

## 2010-07-09 DIAGNOSIS — R195 Other fecal abnormalities: Secondary | ICD-10-CM

## 2010-07-12 ENCOUNTER — Ambulatory Visit (INDEPENDENT_AMBULATORY_CARE_PROVIDER_SITE_OTHER): Payer: BLUE CROSS/BLUE SHIELD | Admitting: *Deleted

## 2010-07-12 DIAGNOSIS — G450 Vertebro-basilar artery syndrome: Secondary | ICD-10-CM

## 2010-07-12 DIAGNOSIS — Z7901 Long term (current) use of anticoagulants: Secondary | ICD-10-CM

## 2010-07-12 DIAGNOSIS — I4891 Unspecified atrial fibrillation: Secondary | ICD-10-CM

## 2010-07-13 ENCOUNTER — Encounter: Payer: Self-pay | Admitting: *Deleted

## 2010-07-19 ENCOUNTER — Other Ambulatory Visit: Payer: Self-pay | Admitting: *Deleted

## 2010-07-19 MED ORDER — WARFARIN SODIUM 7.5 MG PO TABS: 7.5000 mg | ORAL_TABLET | ORAL | Status: DC

## 2010-07-19 MED ORDER — DIGOXIN 250 MCG PO TABS: 1.0000 ug | ORAL_TABLET | Freq: Every day | ORAL | Status: DC

## 2010-07-20 ENCOUNTER — Telehealth: Payer: Self-pay | Admitting: Cardiology

## 2010-07-20 MED ORDER — WARFARIN SODIUM 7.5 MG PO TABS: 7.5000 mg | ORAL_TABLET | ORAL | Status: DC

## 2010-07-20 MED ORDER — DIGOXIN 250 MCG PO TABS: 1.0000 ug | ORAL_TABLET | Freq: Every day | ORAL | Status: DC

## 2010-07-20 NOTE — Consult Note (Signed)
NAME:  Daniel Morse, Daniel Morse             ACCOUNT NO.:  [id number]   MEDICAL RECORD NO.:  [id number]          PATIENT TYPE:  INP   LOCATION:  61                         FACILITY:  [facility]   PHYSICIAN:  Judeth Cornfield. Scot Dock, M.D.DATE OF BIRTH:  Jun 15, 1940   DATE OF CONSULTATION:  05/14/2006  DATE OF DISCHARGE:                                 CONSULTATION   REASON FOR CONSULTATION:  Greater than 80% left carotid stenosis.   REFERRING PHYSICIAN:  Richard A. Rollene Fare, M.D.   HISTORY:  This is a 70 year old gentleman who had a carotid duplex study  done in Dr. Lowella Fairy office which showed evidence of a greater than  80% left carotid stenosis.  The peak systolic velocity was 063 cm/sec on  the left and end-diastolic velocity of 016 cm/sec.  Apparently there was  no significant disease on the right side.  He had been having some right  arm clumsiness and weakness for the last 2-3 months which has been  relatively stable.  It was felt potentially the carotid could be related  to this.  He was admitted by Dr. Rollene Fare and apparently scheduled for  cerebral arteriogram.  He has also been evaluated by the neurologist and  was scheduled for MRI.  Vascular surgery was consulted concerning the  left carotid stenosis.   Of note, the patient is right-handed.  He denies any previous history of  stroke.  He has no history of amaurosis fugax, expressive or receptive  aphasia.   PAST MEDICAL HISTORY:  Significant for:  1. Hypertension.  2. Hypercholesterolemia.   He denies any history of diabetes, history of previous myocardial  infarction, history of congestive heart failure or history of COPD.   FAMILY HISTORY:  He does have a family history of coronary artery  disease and hyperlipidemia.   PAST SURGICAL HISTORY:  Unremarkable.   SOCIAL HISTORY:  He is married.  He has one daughter.  He smokes a pack  per day of cigarettes and has been smoking for 30 years.   REVIEW OF SYSTEMS:  He has  had no recent weight loss, weight gain or  problems with his appetite.  CARDIAC:  He has had no recent chest pain,  chest pressure, palpitations or arrhythmias.  PULMONARY:  He has had no  bronchitis, asthma or wheezing.  ENT:  He has macular degeneration and  is legally blind.  GU:  He has had kidney stones in the past.  He has  had no recent dysuria or frequency.  GI:  He has had no recent change in  his bowel habits, and there is no history of peptic ulcer disease.  NEUROLOGIC: He has had no dizziness, blackouts, headaches or seizures.  He does have a history of some disk disease in the thoracic spine and  has had some chronic pain in his back between the scapulae.   MEDICATIONS OR ALLERGIES:  Are documented on his admission History and  Physical.   PHYSICAL EXAMINATION:  VITAL SIGNS:  Temperature is 97.3, blood pressure  97/65, heart rate 68.  HEENT/NECK:  There is no cervical lymphadenopathy.  I do not detect  any  carotid bruits.  LUNGS:  Clear bilaterally to auscultation.  CARDIAC:  He has a regular rate and rhythm.  ABDOMEN:  Soft and nontender.  I cannot palpate an aneurysm.  EXTREMITIES:  He has palpable femoral, popliteal, and posterior tibial  pulses bilaterally.  He has a palpable left dorsalis pedis pulse.  I  cannot palpate a right dorsalis pedis pulse.  He has some very mild  weakness in the right upper extremity, mostly in the triceps.  He has  good strength in the lower extremities.   The duplex scan was done at Dr. Lowella Fairy office.  I do not have this  report.  I do have the velocities in his note.  The right carotid had  less than 49% stenosis. Peak systolic velocity on the right was 66  cm/sec with an end-diastolic velocity of 25 cm/sec.  On the left side,  he had a peak systolic velocity of 035 cm/sec with an end-diastolic  velocity of 597 cm/sec in the proximal internal carotid artery.  There  is a significant amount heterogeneous plaque in the carotid bulb  on the  left and into the proximal and mid internal carotid artery. The distal  internal carotid artery demonstrated normal patency with no evidence of  diameter reduction.   Given his very limited left carotid stenosis, I would recommend left  carotid endarterectomy in order to lower his risk of stroke.  If this  is, in fact, symptomatic, then his risk of stroke is significant without  carotid endarterectomy.  He is currently on aspirin.  He is scheduled  for MRI tomorrow and I believe also cerebral arteriogram pending.  Pending results of his workup, I could tentatively plan a left carotid  endarterectomy on Friday, May 16, 2006.  I have discussed the  indications for the procedure and the potential complications including  but not limited to bleeding, stroke (periprocedural risk 1-2%), MI,  nerve injury, or other unpredictable medical problems.  All of his  questions were answered, and he is agreeable to proceed pending results  of his workup.      Judeth Cornfield. Scot Dock, M.D.  Electronically Signed     CSD/MEDQ  D:  05/14/2006  T:  05/15/2006  Job:  [id number]   cc:   Delfino Lovett A. Rollene Fare, M.D.

## 2010-07-20 NOTE — Discharge Summary (Signed)
NAMECYRIL, WOODMANSEE             ACCOUNT NO.:  [id number]   MEDICAL RECORD NO.:  [id number]          PATIENT TYPE:  INP   LOCATION:  2725                         FACILITY:  [facility]   PHYSICIAN:  Cyndia Bent, N.P.     DATE OF BIRTH:  08-08-1940   DATE OF ADMISSION:  05/13/2006  DATE OF DISCHARGE:  05/16/2006                               DISCHARGE SUMMARY   ADDENDUM:  Dr. Rollene Fare was concerned about his continued right upper  extremity weakness and his cervical disease.  He had been seen by Dr.  Hilma Favors, and underwent either a CT or an MRI of his neck, and then it  was followed up with a referral to a neurosurgeon at [facility] by  the name of Dr. Barbaraann Rondo.  He has been evaluated by him, and he  was called by their office and told that the right upper extremity  weakness was not related to his cervical disease.  Thus, he was then  referred for further evaluation of vascular disease.      Cyndia Bent, N.P.     BB/MEDQ  D:  05/16/2006  T:  05/17/2006  Job:  [id number]   cc:   Halford Chessman, M.D.

## 2010-07-20 NOTE — H&P (Signed)
NAME:  Daniel Morse, Daniel Morse                       ACCOUNT NO.:  [id number]   MEDICAL RECORD NO.:  [id number]                   PATIENT TYPE:   LOCATION:                                       FACILITY:  [facility]   PHYSICIAN:  Miguel Dibble, M.D.                DATE OF BIRTH:  01/16/1941   DATE OF ADMISSION:  08/11/2002  DATE OF DISCHARGE:                                HISTORY & PHYSICAL   CHIEF COMPLAINT:  Right flank pain, urinary frequency, suprapubic pressure.   HISTORY OF PRESENT ILLNESS:  This 70 year old male went to the emergency  room at [facility] in January 2004 with severe right flank  radiating to the front.  A non-contrasted CT scan of the abdomen and pelvis  revealed a 6-mm-size right upper ureteral calculus with hydronephrosis.  The  right kidney was enlarged and there was a 0.9-cm-size cyst in the lower pole  of the left kidney.  The patient has not passed the stone.  He has been  having intermittent right flank pain as well as urinary frequency and  suprapubic pressure.  A recent x-ray of the KUB area revealed that the stone  has migrated to the right distal ureter.  The patient is unable to pass the  stone.  He is brought to the day hospital today for lithotripsy of the right  distal ureteral calculus.   PAST MEDICAL HISTORY:  History of macular degeneration, status post  hydrocelectomy, chronic prostatitis, GERD.   MEDICATIONS:  Prevacid and Vicodin p.r.n. for pain.   ALLERGIES:  None.   PHYSICAL EXAMINATION:  GENERAL:  Height 5 feet 6 inches, weight 205 pounds.  HEENT:  Normal.  NECK:  No masses.  LUNGS:  Lungs clear to auscultation.  HEART:  Regular rate and rhythm with no murmurs.  ABDOMEN:  Abdomen soft.  No palpable flank mass.  No CVA tenderness.  Bladder not palpable.  GU:  Penis normal.  Testes are normal.  There is a 3-cm-size left  spermatocele.  RECTAL:  A 33-gram-size benign prostate.   IMPRESSION:  Right distal ureteral calculus with  obstruction, urinary  frequency.    PLAN:  ESWL of right distal ureteral calculus with IV sedation in day  hospital.  I have discussed with the patient regarding the diagnosis,  operative details, the alternative treatments, outcome, possible risks and  complications and he has agreed for the procedure to be done.                                               Miguel Dibble, M.D.    SK/MEDQ  D:  08/11/2002  T:  08/11/2002  Job:  [id number]   cc:   Leonides Grills, M.D.  [address]  [place]  [place] [address]  Fax:  [phone]  

## 2010-07-20 NOTE — Discharge Summary (Signed)
NAMECONWAY, FEDORA             ACCOUNT NO.:  [id number]   MEDICAL RECORD NO.:  [id number]          PATIENT TYPE:  INP   LOCATION:  0938                         FACILITY:  [facility]   PHYSICIAN:  Cyndia Bent, N.P.     DATE OF BIRTH:  [birth year]   DATE OF ADMISSION:  05/13/2006  DATE OF DISCHARGE:  05/16/2006                               DISCHARGE SUMMARY   HISTORY:  The patient was seen in the office.  He had complaints of  right arm numbness and clumsiness.  He apparently had some carotid  Dopplers which showed high-grade disease in the left.  He was admitted.  He was placed on heparin and a neural consult was called.  He had an MRI  of his brain that did not show any acute lesions.  A vascular surgery  consult was called.  They recommended a left CEA.  This was then  performed on May 15, 2006, by Dr. Doren Custard with a Dacron patch  angioplasty.   Post surgery he did well. He was stable.  He was seen by Dr. Rollene Fare  on May 16, 2006.  There was  question if he needed a neurosurgical  consult secondary to his right upper extremity weakness.  He was seen by  neurology again and they thought that he could be discharged home.   DISCHARGE MEDICATIONS:  Lisinopril/HCTZ 20/12.5 daily, Zetia 10 mg a  day, Tricor 145 mg a day, Neurontin 300 mg a day, fish oil capsules  twice a day, Wellbutrin 100 mg a day, Protonix 40 mg a day, Prilosec 20  mg a day, aspirin 81 mg a day and Tylox 1 to 2 every 4 hours p.r.n. for  pain.   DISCHARGE DIAGNOSES:  1. Arteriosclerotic peripheral vascular disease status post left      carotid endarterectomy on May 15, 2006, by Dr. Doren Custard.  2. Hypertension.  3. Hyperlipidemia.  4. Negative Cardiolite for ischemia July 2007.  5. Gastroesophageal reflux disease.  6. History of ureterolithiasis.  7. History of macular degeneration starting at the age of 47.  He is      legally blind.  8. History of cervical, thoracic and lumbar spine disease.   PLAN:  He will  follow up with Dr. Doren Custard in 2-3 weeks.      Cyndia Bent, N.P.     BB/MEDQ  D:  05/16/2006  T:  05/17/2006  Job:  [id number]   cc:   Abbott Pao. Doren Custard, P.A.  Acquanetta Chain, D.O.

## 2010-07-20 NOTE — Op Note (Signed)
NAME:  Daniel Morse, Daniel Morse                       ACCOUNT NO.:  [id number]   MEDICAL RECORD NO.:  [id number]                   PATIENT TYPE:  AMB   LOCATION:  DAY                                  FACILITY:  [facility]   PHYSICIAN:  Miguel Dibble, M.D.                DATE OF BIRTH:  1940-03-12   DATE OF PROCEDURE:  09/22/2002  DATE OF DISCHARGE:                                 OPERATIVE REPORT   PREOPERATIVE DIAGNOSES:  1. Right distal ureteral calculus with obstruction.  2. Right renal colic.   POSTOPERATIVE DIAGNOSES:  1. Right distal ureteral calculus with obstruction.  2. Right renal colic.   PROCEDURE:  1. Cystoscopy.  2. Right retrograde pyelogram.  3. Ureteroscopy.  4. Stone extraction.  5. Right ureteral stent placement.   ANESTHESIA:  Spinal.   SURGEON:  Miguel Dibble, M.D.   COMPLICATIONS:  None.   DRAINS:  6 French 26 cm size right ureteral stent with a string.   SPECIMENS:  5 mm stone, which was sent for chemical analysis.   INDICATION FOR PROCEDURE:  This 70 year old male has been having  intermittent right flank pain since January 2004.  Evaluation revealed a 6 x  5 mm size right distal ureteral calculus with obstruction.  ESL of the stone  was done a few weeks ago.  The stone has fragmented but the patient was  unable to pass the stone fragments.  He was taken to the OR today for  cystoscopy, right retrograde pyelogram, ureteroscopic stone extraction, and  ureteral stent placement.   DESCRIPTION OF PROCEDURE:  Spinal anesthesia was induced, and the patient  was placed on the OR table in the dorsal lithotomy position.  The lower  abdomen and genitalia were prepped and draped in a sterile fashion.  Cystoscopy was done with a 25 [place] scope.  The urethra, prostate, and  bladder were normal.  A 5 French wedge catheter was then placed in the right  ureteral orifice.  About 7 mL of Renografin 60 was injected into the  collecting system and a retrograde pyelogram  was done by using C-arm  fluoroscopy.  There was a filling defect about 6 mm in size in the distal  ureter about 3 cm above the ureteral orifice.  There was an area of  narrowing of the ureter just below the stone.   A 5 French open-ended catheter was then placed in the right distal ureter.  An 0.038 inch Benson guidewire with a flexible tip was then advanced into  the renal pelvis without any difficulty.  The distal ureter was then dilated  using an 46 French balloon dilating catheter.  The balloon dilating catheter  was then removed leaving the guidewire in place.   A 9 French rigid ureteroscope was then inserted into the distal ureter.  Examination was done up to the pelvic brim, and the stone was not seen.  Examination of the bladder revealed  that the stone has dropped into the  bladder during the removal of the balloon dilating catheter.  Bladder  irrigation was done and the stone was removed.  The ureteroscopy was  repeated and there was no residual stone up to the level of the pelvic brim.  The instruments were then removed.  A 6 French 26 cm size stent with a  string was then inserted into the right collecting system.  The patient was  transferred to the PACU in a satisfactory condition.                                               Miguel Dibble, M.D.    SK/MEDQ  D:  09/22/2002  T:  09/22/2002  Job:  [id number]   cc:   Leonides Grills, M.D.  [address] [place] [address]  Fax: [phone]

## 2010-07-20 NOTE — Consult Note (Signed)
NAME:  Daniel Morse, Daniel Morse                       ACCOUNT NO.:  [id number]   MEDICAL RECORD NO.:  [id number]                   PATIENT TYPE:  INP   LOCATION:  A219                                 FACILITY:  [facility]   PHYSICIAN:  Signa Kell, M.D.             DATE OF BIRTH:  1940/07/23   DATE OF CONSULTATION:  DATE OF DISCHARGE:                                   CONSULTATION   CARDIAC CONSULT NOTE:  The patient is a 70 year old, legally blind, white,  married male with multiple risk factors including obesity, smoking,  hypertension, strong family history presenting with atypical chest pain over  a period of 8-10 days.  The patient states he has had left axillary shooting  pain and also indigestion.  The symptoms are of a short duration and not  related to any particular movement or exertion.  The patient is very  inactive because of the fact that he is legally blind.  On arrival in the  emergency room, he received nitroglycerin with minimal relief and then he  got complete relief with a GI cocktail.  The EKG reveals very slight  insignificant ST depression in V6.  The patient apparently became more  concerned this morning as the pain was more frequent and more severe and  asked to be brought to the emergency room.   PAST HISTORY:  Reveals he has had:  1. Renal stones.  2. He has had esophagitis for which he is seeing Dr. Laural Golden.  3. He has also had a colonoscopy per Dr. Laural Golden.  4. 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   MEDICATIONS:  Include Prevacid.   ALLERGIES:  None.   SOCIAL HISTORY:  He smokes.   FAMILY HISTORY:  Father died at 59 of an MI and brother died at 24 of  rheumatic heart disease.   REVIEW OF SYSTEMS:  Other than above is generally unremarkable.   PHYSICAL EXAMINATION:  VITAL SIGNS:  Blood pressure 157/111, heart rate is  78 normal sinus rhythm.  GENERAL:  Patient is obese in no distress.  He is oriented.  JVP is not  elevated.  Carotid pulse  palpable without bruits.  LUNGS:  Reveal decreased breath sounds with a few basilar coarse rales.  CARDIAC:  Reveals no murmur or gallop.  ABDOMEN:  Nontender.  No masses and no organomegaly.  EXTREMITIES:  Pulses palpable and equal bilaterally.  There is no edema.  NEUROLOGIC:  No evidence of neurological abnormality.   Chest x-ray apparently is unremarkable.   Initial enzymes are negative.   IMPRESSION:  1. Atypical chest pain in the setting of multiple risk factors.  2. Hypertension.  3. Renal stones.  4. Esophageal stricture.  5. Obesity.   PLAN:  Although symptoms are quite atypical, he has multiple risk factors  and I am somewhat concerned about the possible unstable coronary disease.  I  suggest admission with further enzymes, repeat EKG, and  he should have a  stress Cardiolite in the morning if the enzymes are negative.  I have  discussed this with Dr. Theda Belfast, M.D.    EJL/MEDQ  D:  11/10/2002  T:  11/10/2002  Job:  [id number]   cc:   Sherrilee Gilles. Gerarda Fraction, M.D.  [address] [place] [address]  Fax: [phone]   Leonides Grills, M.D.  [address] [place] [address]  Fax: [phone]

## 2010-07-20 NOTE — H&P (Signed)
NAME:  Daniel Morse, Daniel Morse                       ACCOUNT NO.:  [id number]   MEDICAL RECORD NO.:  [id number]                   PATIENT TYPE:  AMB   LOCATION:  DAY                                  FACILITY:  [facility]   PHYSICIAN:  Miguel Dibble, M.D.                DATE OF BIRTH:  1940-11-15   DATE OF ADMISSION:  09/22/2002  DATE OF DISCHARGE:                                HISTORY & PHYSICAL   CHIEF COMPLAINT:  Intermittent right flank pain, right distal ureteral  calculus with obstruction.   HISTORY OF PRESENT ILLNESS:  This 70 year old male has been having  intermittent right flank pain since January 2004.  The pain was radiating to  the front.  A noncontrast CT scan of the abdomen and pelvis revealed a 6 x 5  mm right upper ureteral calculus with hydronephrosis.  The right ureter is  enlarged and there is a small cyst in the lower pole of the lower left  kidney.  The patient could not pass the stone.  A repeat KUB revealed stone  migration to the right distal ureter.  The patient had undergone lithotripsy  of the right distal ureteral calculus about six weeks ago.  The stone had  been fragmented but he was unable to pass the stone fragments.  He has  intermittent right flank pain.  He is brought to the day hospital today for  cystoscopy, right retrograde pyelogram, ureteroscopic stone extraction and  stent placement.  The patient denied having any fever, postvoiding  difficulty or gross hematuria at present.   PAST MEDICAL HISTORY:  1. Macular degeneration.  2. Status post hydrocelectomy.  3. Chronic prostatitis.  4. GERD.   MEDICATIONS:  Prevacid.   ALLERGIES:  None.   PHYSICAL EXAMINATION:  VITAL SIGNS:  Height 5 feet 8 inches, 205 pounds.  HEENT:  Normal.  NECK:  No masses.  LUNGS:  Clear to auscultation.  HEART:  Regular rate and rhythm, no murmur.  ABDOMEN:  Soft, no palpable flank mass.  No CVA tenderness.  GENITALIA:  Bladder is not palpable, penis normal.  Testes  are normal.  There is a 3 cm left spermatocele.  RECTAL:  35 g benign prostate.   IMPRESSION:  Right distal ureteral calculus with obstruction, right renal  colic.   PLAN:  1. Cystoscopy.  2. Retrograde pyelogram.  3.     Urethroscopy with stone extraction and stent placement at the [facility].  4. I discussed with the patient regarding the diagnosis, alternative     treatments, outcome, possible complications, and he has agreed for the     procedure to be done.  Miguel Dibble, M.D.    SK/MEDQ  D:  09/21/2002  T:  09/21/2002  Job:  [id number]   cc:   Leonides Grills, M.D.  [address] [place] [address]  Fax: [phone]

## 2010-07-20 NOTE — Telephone Encounter (Signed)
Patient's wife states that presciptions were not called in to [facility] in [place] / tg

## 2010-07-20 NOTE — Op Note (Signed)
NAME:  Daniel Morse, Daniel Morse             ACCOUNT NO.:  [id number]   MEDICAL RECORD NO.:  [id number]          PATIENT TYPE:  AMB   LOCATION:  DAY                           FACILITY:  [facility]   PHYSICIAN:  Hildred Laser, M.D.    DATE OF BIRTH:  01/29/1941   DATE OF PROCEDURE:  02/11/2006  DATE OF DISCHARGE:                               OPERATIVE REPORT   PROCEDURE:  Colonoscopy.   ENDOSCOPIST:  Hildred Laser, M.D.   INDICATIONS FOR PROCEDURE:  [name] is a 70 year old Caucasian male, who  is here for a surveillance colonoscopy.  His last exam was five years  ago, with the removal of a tubular adenoma.  The procedure and the risks  were reviewed with the patient and an informed consent was obtained.   MEDICATIONS:  For conscious sedation with Demerol 50 mg IV, Versed 6 mg  IV.   FINDINGS/DESCRIPTION OF PROCEDURE:  The procedure was performed in the  endoscopy suite.  The patient's vital signs and O2 saturation were  monitored during the procedure and remained stable.  The patient was  placed in the left lateral  recumbent position and a rectal examination  was performed.  No abnormality noted on external or digital examination.  The Pentax videoscope was placed in the rectum and advanced under vision  to the sigmoid colon and beyond.  Preparation was excellent.  The scope  was passed into the cecum.  In order to see the blind cecum, the patient  had to be turned onto the right side.  The scope was easily passed  beyond the bowel and appendiceal orifice.  It was identified.  Pictures  were taken for the record.  The blind cecum was normal.  As the scope  was withdrawn, the colonic mucosa was carefully examined.  There were  two small polyps in the ascending colon just above the ileocecal valve.  These were ablated by cold biopsy and submitted in one container.  There  was another small polyp at the sigmoid colon, which was ablated by cold  biopsy and a third one was ablated by cold biopsy  from the rectum.  While in the rectum, the scope was retroflexed to examine in the rectum,  which was unremarkable.  The endoscope was straightened and withdrawn.   The patient tolerated the procedure well.   FINAL DIAGNOSES:  1. Examination performed to the cecum.  2. Four small polyps, ablated by cold biopsy, two from the ascending      colon (one container), and one from the sigmoid and the fourth one      from the rectum.   RECOMMENDATIONS:  Standard instructions given.  He may resume his ASA as  before.  I will be contacting the patient with the results of the biopsy  and further recommendations.      Hildred Laser, M.D.  Electronically Signed     NR/MEDQ  D:  02/11/2006  T:  02/11/2006  Job:  [id number]   cc:   Halford Chessman, M.D.  Fax: [phone]

## 2010-07-20 NOTE — H&P (Signed)
   NAME:  Daniel Morse, Daniel Morse                       ACCOUNT NO.:  [id number]   MEDICAL RECORD NO.:  [id number]                   PATIENT TYPE:  INP   LOCATION:  A219                                 FACILITY:  [facility]   PHYSICIAN:  Sherrilee Gilles. Gerarda Fraction, M.D.             DATE OF BIRTH:  03/18/1940   DATE OF ADMISSION:  11/10/2002  DATE OF DISCHARGE:                                HISTORY & PHYSICAL   CHIEF COMPLAINT:  Chest pain.   HISTORY OF PRESENT ILLNESS:  The patient developed on and off chest  discomfort, atypical, left sided and left axillary shooting pain associated  with indigestion.  This has been going on for at least several months but  getting worse over the past several weeks.  It prompted him to come to the  emergency department today due to presumably an increase in severity and  duration.  He denied any diaphoresis, palpitations, or syncope.  No nausea,  vomiting or diarrhea.  No GI symptoms.   PAST MEDICAL HISTORY:  1. Status post esophageal stricture with recent dilatation performed by Dr.     Laural Golden.  2. Status post colonoscopy with negative result according to patient.  3. History of renal stones.  4. Hypertension.  5. Macular degeneration with legal blindness.  6. Status post renal stones.   SOCIAL HISTORY:  Positive for smoking.  Negative for alcohol or drug use.   FAMILY HISTORY:  Positive for coronary artery disease at greater than 48  years of age.   REVIEW OF SYSTEMS:  As under HPI.  All else negative.   PHYSICAL EXAMINATION:  SKIN:  Unremarkable.  HEAD AND NECK:  No JVD or adenopathy.  Neck is supple.  CHEST:  Clear.  CARDIAC:  Regular, rate and rhythm without murmur, gallop or rub.  ABDOMEN:  Soft, no organomegaly or masses.  EXTREMITIES:  Without clubbing, cyanosis or edema.  NEUROLOGIC:  Nonfocal.   Electrocardiogram reveals nonspecific ST-T wave changes.  No definite acute  infarct or ischemia.   Cardiac enzymes were negative.  Electrolytes  negative.  CBC normal.   IMPRESSION:  1. Atypical chest pain.  Cardiology consultation as documented in the     emergency department.  Cardiolite stress     test anticipated.  Serial enzymes, observation for arrhythmia.  2. Hypertension.  Monitor, achieve adequate control.  3. Macular degeneration, continue present medications as per home, over the     counter, use ICaps.                                               Sherrilee Gilles. Gerarda Fraction, M.D.    LJF/MEDQ  D:  11/10/2002  T:  11/10/2002  Job:  [id number]

## 2010-07-20 NOTE — Op Note (Signed)
NAME:  Daniel Morse, Daniel Morse             ACCOUNT NO.:  [id number]   MEDICAL RECORD NO.:  [id number]          PATIENT TYPE:  INP   LOCATION:  75                         FACILITY:  [facility]   PHYSICIAN:  Judeth Cornfield. Scot Dock, M.D.DATE OF BIRTH:  [birth year]   DATE OF PROCEDURE:  05/15/2006  DATE OF DISCHARGE:  05/16/2006                               OPERATIVE REPORT   PREOPERATIVE DIAGNOSIS:  Symptomatic left carotid stenosis   POSTOPERATIVE DIAGNOSIS:  Symptomatic left carotid stenosis.   PROCEDURE:  Left carotid endarterectomy with Dacron patch angioplasty.   SURGEON:  Judeth Cornfield. Scot Dock, M.D.   ASSISTANT:  Nelda Severe. Kellie Simmering, M.D.  Jacinta Shoe, P.A.-C.   ANESTHESIA:  General.   TECHNIQUE:  The patient was taken to the operating room and received a  general anesthetic.  The left neck was prepped and draped in the usual  sterile fashion.  An incision was made along the anterior border of the  sternocleidomastoid and dissection carried down to the common carotid  artery which was dissected free and controlled with a Rumel tourniquet.  The facial vein was divided between 2-0 silk ties.  The internal carotid  artery was controlled above the plaque.  The external and superior  thyroid arteries were controlled.  The patient was heparinized.  Clamps  were then placed on the internal then the common and external carotid  artery.  A longitudinal arteriotomy was made in the common carotid  artery.  This was extended through the plaque into the internal carotid  artery above the plaque.  A 10 Bard shunt was placed into the internal  carotid artery, back bled, and placed into the common carotid artery and  secured with a Rumel tourniquet.  Flow was re-established through the  shunt.  An endarterectomy plane was established proximally and the  plaque was sharply divided.  Eversion endarterectomy was performed of  the external carotid artery.  Distally, there was a nice taper in the  plaque and no tacking sutures were required.  The artery was irrigated  with copious amounts of heparin and dextran. All loose debris was  removed.  A Dacron patch was then sewn using continuous 6-0 Prolene  suture.  Prior to completing the patch closure, the shunt was removed.  The arteries were back bled and flushed appropriately and the  anastomosis was completed.  Flow was re-established first to the  external carotid artery then to the internal carotid artery.  Hemostasis  was obtained in the wound.  There was a good pulse distal to the patch  and a good Doppler signal.  The wound was closed with a deep layer of 3-  0 Vicryl.  The platysma was closed with running 3-0 Vicryl.  The skin  was closed with a 4-0 subcuticular stitch.  A sterile dressing was  applied.  The patient tolerated the procedure well and awoke  neurologically intact.  All needle and sponge counts were correct.     Judeth Cornfield. Scot Dock, M.D.  Electronically Signed    CSD/MEDQ  D:  06/17/2006  T:  06/17/2006  Job:  [id number]

## 2010-07-20 NOTE — Consult Note (Signed)
Daniel Morse, Daniel Morse             ACCOUNT NO.:  [id number]   MEDICAL RECORD NO.:  [id number]          PATIENT TYPE:  INP   LOCATION:  2011                         FACILITY:  [facility]   PHYSICIAN:  Shaune Pascal. Champey, M.D.DATE OF BIRTH:  Jul 14, 1940   DATE OF CONSULTATION:  05/13/2006  DATE OF DISCHARGE:                                 CONSULTATION   REQUESTING PHYSICIAN:  Richard A. Rollene Fare, M.D.   REASON FOR CONSULTATION:  TIA with left carotid disease.   HISTORY OF PRESENT ILLNESS:  Mr. Stammer is a 70 year old male with  multiple medical problems who presents today after carotid Doppler  showing left 90-95% stenosis.  The patient has a history of carotid  disease and recently progressed.  The patient also has a history of  cervical spine disease.  He has been complaining over the last 3-4  months of right hand numbness and tingling, right upper extremity  clumsiness and weakness and right upper extremity pain.  He denies any  neck pain, headaches, vision changes, speech changes, swallowing  problems, chewing problems, dizziness, vertigo or loss of consciousness.   PAST MEDICAL HISTORY:  Positive for left carotid disease, macular  degeneration (legally blind), elevated lipids, hypertension, reflux.   CURRENT MEDICATIONS:  1. Lisinopril.  2. Hydrochlorothiazide.  3. Zetia.  4. Wellbutrin.  5. Prilosec.  6. Aspirin.  7. Heparin.   ALLERGIES:  None.   FAMILY HISTORY:  Positive for heart disease.   SOCIAL HISTORY:  The patient lives with his wife, smokes 1 pack of  cigarettes day.  Drinks alcohol 2-3 times per week.   REVIEW OF SYSTEMS:  Positive as per HPI.  Review of systems is negative  as per HPI in greater than 8 other organ systems.   PHYSICAL EXAMINATION:  VITAL SIGNS:  Temperature is 98.5, pulse 82,  respirations 20, blood pressure 104/72, O2 sat is 95%.  HEENT:  Normocephalic, atraumatic.  The patient is legally blind.  Extraocular muscles are intact.  NECK:  Supple, no carotid bruit.  HEART:  Regular.  LUNGS: Clear.  ABDOMEN: Soft.  EXTREMITIES:  Good pulses.  NEUROLOGICAL:  The patient is awake, alert and language is fluent.  Following commands appropriately.  Cranial nerves II-XII are grossly  intact.  Patient is legally blind.  MOTOR: The patient has 4/4 strength in the right upper extremity,  otherwise has 5/5 strength throughout.  The patient has normal tone and  no drift noted.  SENSORY:  Within normal limits to light touch.  REFLEXES:  Trace to 1+ throughout.  CEREBELLAR:  Within normal limits finger-to-nose, gait is not assessed  secondary to safety.   LABORATORY DATA:  WBC is 9.9, hemoglobin 15.3, hematocrit 43.7,  platelets 247.  Pro-time is 12.9, INR is 1.0, PTT is 30.  Comprehensive  metabolic panel is normal.   IMPRESSION:  A 70 year old with severe left carotid disease and right  upper extremity weakness and numbness.  I agree with IV heparin and  cerebral angiogram which is already ordered.  The patient will probably  need a left carotid endarterectomy and consider Vascular Surgery  consult.  I will  check an magnetic resonance imaging of the brain to  evaluate for stroke contributing to the patient's symptoms.  I will also  check fasting lipids and homocystine level.  Will follow the patient on  stroke consult service.  Encourage the patient to stop smoking.  Will  follow with the patient.      Shaune Pascal. Estella Husk, M.D.  Electronically Signed     DRC/MEDQ  D:  05/13/2006  T:  05/15/2006  Job:  [id number]

## 2010-07-26 ENCOUNTER — Ambulatory Visit (INDEPENDENT_AMBULATORY_CARE_PROVIDER_SITE_OTHER): Payer: Medicare Other | Admitting: *Deleted

## 2010-07-26 DIAGNOSIS — G450 Vertebro-basilar artery syndrome: Secondary | ICD-10-CM

## 2010-07-26 DIAGNOSIS — Z7901 Long term (current) use of anticoagulants: Secondary | ICD-10-CM

## 2010-07-26 DIAGNOSIS — I4891 Unspecified atrial fibrillation: Secondary | ICD-10-CM

## 2010-07-26 MED ORDER — WARFARIN SODIUM 2.5 MG PO TABS: 2.5000 mg | ORAL_TABLET | ORAL | Status: DC

## 2010-08-06 ENCOUNTER — Ambulatory Visit (INDEPENDENT_AMBULATORY_CARE_PROVIDER_SITE_OTHER): Payer: Medicare Other | Admitting: *Deleted

## 2010-08-06 ENCOUNTER — Telehealth: Payer: Self-pay

## 2010-08-06 DIAGNOSIS — I4891 Unspecified atrial fibrillation: Secondary | ICD-10-CM

## 2010-08-06 DIAGNOSIS — R0602 Shortness of breath: Secondary | ICD-10-CM

## 2010-08-06 DIAGNOSIS — Z7901 Long term (current) use of anticoagulants: Secondary | ICD-10-CM

## 2010-08-06 DIAGNOSIS — G450 Vertebro-basilar artery syndrome: Secondary | ICD-10-CM

## 2010-08-06 DIAGNOSIS — I498 Other specified cardiac arrhythmias: Secondary | ICD-10-CM

## 2010-08-06 DIAGNOSIS — I10 Essential (primary) hypertension: Secondary | ICD-10-CM

## 2010-08-06 DIAGNOSIS — R609 Edema, unspecified: Secondary | ICD-10-CM

## 2010-08-06 NOTE — Telephone Encounter (Signed)
**Note De-Identified Zayde Stroupe Obfuscation** S: While at [facility] clinic today pt. c/o sob and swelling in feet and ankles. B: Pt. was last seen by Dr. Lattie Haw on 07-04-10 and had no swelling or sob at that time. A: Pt. c/o sob (O2=91%) and swelling in feet and ankles that he states started early to midweek last week.  R: Pt. Is advised to elevated legs as often as possible and to sleep with 2 pillows under his head. Also, pt. Advised that we will contact him with Dr. Izell  recommendations, if any.

## 2010-08-09 NOTE — Telephone Encounter (Signed)
08/09/2010  Echocardiogram-atrial arrhythmias CMet, BNP level, CBC, TSH Chest x-ray Return visit with Ms. Lawrence or [employer] available

## 2010-08-10 NOTE — Telephone Encounter (Signed)
Gave Daniel Morse recommendations: Echo, labs, cxr and follow up appt Will call with dates

## 2010-08-14 ENCOUNTER — Ambulatory Visit (HOSPITAL_COMMUNITY)
Admission: RE | Admit: 2010-08-14 | Discharge: 2010-08-14 | Disposition: A | Discharge status: Home / Self Care | Payer: Medicare Other | Source: Ambulatory Visit | Attending: Cardiology | Admitting: Cardiology

## 2010-08-14 DIAGNOSIS — I517 Cardiomegaly: Secondary | ICD-10-CM

## 2010-08-14 DIAGNOSIS — R0602 Shortness of breath: Secondary | ICD-10-CM | POA: Insufficient documentation

## 2010-08-14 DIAGNOSIS — I10 Essential (primary) hypertension: Secondary | ICD-10-CM | POA: Insufficient documentation

## 2010-08-14 DIAGNOSIS — I4891 Unspecified atrial fibrillation: Secondary | ICD-10-CM | POA: Insufficient documentation

## 2010-08-15 LAB — TSH: TSH: 4.807 u[IU]/mL — ABNORMAL HIGH (ref 0.350–4.500)

## 2010-08-15 LAB — CBC WITH DIFFERENTIAL/PLATELET
Eosinophils Relative: 3 % (ref 0–5)
HCT: 42.5 % (ref 39.0–52.0)
Lymphocytes Relative: 39 % (ref 12–46)
Lymphs Abs: 3.4 10*3/uL (ref 0.7–4.0)
MCV: 95.9 fL (ref 78.0–100.0)
Neutro Abs: 4.4 10*3/uL (ref 1.7–7.7)
Platelets: 176 10*3/uL (ref 150–400)
RBC: 4.43 MIL/uL (ref 4.22–5.81)
WBC: 8.7 10*3/uL (ref 4.0–10.5)

## 2010-08-15 LAB — COMPREHENSIVE METABOLIC PANEL
Alkaline Phosphatase: 49 U/L (ref 39–117)
BUN: 18 mg/dL (ref 6–23)
Glucose, Bld: 75 mg/dL (ref 70–99)
Potassium: 4 mEq/L (ref 3.5–5.3)
Total Bilirubin: 0.5 mg/dL (ref 0.3–1.2)

## 2010-08-20 ENCOUNTER — Ambulatory Visit (INDEPENDENT_AMBULATORY_CARE_PROVIDER_SITE_OTHER): Payer: Medicare Other | Admitting: Adult Health

## 2010-08-20 ENCOUNTER — Ambulatory Visit (INDEPENDENT_AMBULATORY_CARE_PROVIDER_SITE_OTHER): Payer: Medicare Other | Admitting: *Deleted

## 2010-08-20 ENCOUNTER — Encounter: Payer: Self-pay | Admitting: Adult Health

## 2010-08-20 DIAGNOSIS — I5020 Unspecified systolic (congestive) heart failure: Secondary | ICD-10-CM

## 2010-08-20 DIAGNOSIS — Z7901 Long term (current) use of anticoagulants: Secondary | ICD-10-CM

## 2010-08-20 DIAGNOSIS — R0989 Other specified symptoms and signs involving the circulatory and respiratory systems: Secondary | ICD-10-CM

## 2010-08-20 DIAGNOSIS — I5031 Acute diastolic (congestive) heart failure: Secondary | ICD-10-CM | POA: Insufficient documentation

## 2010-08-20 DIAGNOSIS — G450 Vertebro-basilar artery syndrome: Secondary | ICD-10-CM

## 2010-08-20 DIAGNOSIS — R0602 Shortness of breath: Secondary | ICD-10-CM

## 2010-08-20 DIAGNOSIS — F17200 Nicotine dependence, unspecified, uncomplicated: Secondary | ICD-10-CM

## 2010-08-20 DIAGNOSIS — I4891 Unspecified atrial fibrillation: Secondary | ICD-10-CM

## 2010-08-20 DIAGNOSIS — Z720 Tobacco use: Secondary | ICD-10-CM

## 2010-08-20 DIAGNOSIS — I4892 Unspecified atrial flutter: Secondary | ICD-10-CM

## 2010-08-20 DIAGNOSIS — R609 Edema, unspecified: Secondary | ICD-10-CM

## 2010-08-20 DIAGNOSIS — R0609 Other forms of dyspnea: Secondary | ICD-10-CM

## 2010-08-20 DIAGNOSIS — R0600 Dyspnea, unspecified: Secondary | ICD-10-CM | POA: Insufficient documentation

## 2010-08-20 LAB — POCT INR: INR: 2

## 2010-08-20 MED ORDER — FUROSEMIDE 20 MG PO TABS: 20.0000 mg | ORAL_TABLET | Freq: Every day | ORAL | Status: DC

## 2010-08-20 NOTE — Assessment & Plan Note (Signed)
Rate is well controlled at present.

## 2010-08-20 NOTE — Assessment & Plan Note (Addendum)
I have reviewed his echo and discussed this with Dr. Lattie Haw. I will start him on Lasix 20 mg daily to allow for diureses with mild systolic dysfx. I have reviewed his medications and see no meds that could potentially cause LEE.  He may have some nocturnal fluid shifts as explained by Dr. Lattie Haw.  Hopefully, a little diuretic will be helpful for his symptoms of PND as well.  We may need to consider sleep study at a later date.  I reviewed CXR and he was not found to have evidence of CHF.  BNP was 97.6.

## 2010-08-20 NOTE — Assessment & Plan Note (Signed)
Cessation is recommended.

## 2010-08-20 NOTE — Patient Instructions (Signed)
Your physician recommends that you schedule a follow-up appointment in: 1 month with Dr Lattie Haw Please bring a record of your blood pressure readings to your appointment  Your physician has recommended you make the following change in your medication: start Furosemide 20 mg daily (in the morning)  Your physician recommends that you return for lab work in: 3 weeks       .

## 2010-08-20 NOTE — Assessment & Plan Note (Signed)
As above concerning echo and CXR.  Review of his labs does not demonstrate evidence of anemia with Hgb 14.3 and Hct 42.5.  Renal fx was normal with Creat. 1.15, BUN 18.Will continue prinzide per Dr. Izell Houck recommendations.  Will repeat his CMET in one month.

## 2010-08-20 NOTE — Progress Notes (Signed)
HPI : [name] returns to the office after a recent hospital admission for the new onset of atrial flutter with a rapid ventricular response.  He responded well to medication to control heart rate, but blood pressure was at times on the low side.  ACE Inhibitor, which had previously been used for hypertension, was discontinued as was aspirin.     Over the last couple of weeks he has had complaints of DOE, LEE, and mild chest discomfort. Dr. Lattie Haw requested lab work, Echo, and CXR to evaluate him for CHF.  He is here to discuss the test results. He continues to have some PND and often gets up out of bed around 12am-3am and sleeps the rest of the time in a recliner.  Usually by the morning he is feeling better and the process starts over again.  Current Outpatient Prescriptions on File Prior to Visit  Medication Sig Dispense Refill  . digoxin (LANOXIN) 0.25 MG tablet Take 0.5 tablets (125 mcg total) by mouth daily.  15 tablet  3  . fenofibrate 160 MG tablet Take 160 mg by mouth daily.       . metoprolol tartrate (LOPRESSOR) 25 MG tablet Take 1 tablet (25 mg total) by mouth 3 (three) times daily.  270 tablet  1  . omeprazole (PRILOSEC) 20 MG capsule Take 20 mg by mouth 2 (two) times daily.       Marland Kitchen warfarin (COUMADIN) 2.5 MG tablet Take 1 tablet (2.5 mg total) by mouth as directed.  45 tablet  3     Allergies  Allergen Reactions  . Simvastatin Other (See Comments)    Severe myalgias      IFO:YDXAJO of systems complete and found to be negative unless listed above  General: Well developed, well nourished, in no acute distress Head: Eyes PERRLA, No xanthomas.   Normal cephalic and atramatic  Lungs: Clear bilaterally to auscultation and percussion. Heart: IRRR S1 S2, with.  Pulses are 2+ & equal.            No carotid bruit. No JVD.  No abdominal bruits. No femoral bruits. Abdomen: Bowel sounds are positive, abdomen soft and non-tender without masses or                  Hernia's noted. Msk:   Back normal, normal gait. Normal strength and tone for age. Extremities: No clubbing, cyanosis or 1+ edema bilaterally.  DP +1 Neuro: Alert and oriented X 3. Psych:  Good affect, responds appropriately

## 2010-09-06 ENCOUNTER — Telehealth: Payer: Self-pay | Admitting: Cardiology

## 2010-09-06 MED ORDER — FUROSEMIDE 40 MG PO TABS: ORAL_TABLET | ORAL | Status: DC

## 2010-09-06 NOTE — Telephone Encounter (Signed)
Needs refill on Lasix $Remove'20mg'BYbXqRy$  BID x 60 called in to [facility] in [place] / tg

## 2010-09-10 ENCOUNTER — Encounter: Payer: Medicare Other | Admitting: *Deleted

## 2010-09-19 ENCOUNTER — Ambulatory Visit (INDEPENDENT_AMBULATORY_CARE_PROVIDER_SITE_OTHER): Payer: Medicare Other | Admitting: *Deleted

## 2010-09-19 ENCOUNTER — Encounter: Payer: Self-pay | Admitting: Cardiology

## 2010-09-19 ENCOUNTER — Ambulatory Visit (INDEPENDENT_AMBULATORY_CARE_PROVIDER_SITE_OTHER): Payer: Medicare Other | Admitting: Cardiology

## 2010-09-19 ENCOUNTER — Telehealth: Payer: Self-pay | Admitting: Cardiology

## 2010-09-19 DIAGNOSIS — IMO0002 Reserved for concepts with insufficient information to code with codable children: Secondary | ICD-10-CM

## 2010-09-19 DIAGNOSIS — I509 Heart failure, unspecified: Secondary | ICD-10-CM

## 2010-09-19 DIAGNOSIS — I5031 Acute diastolic (congestive) heart failure: Secondary | ICD-10-CM

## 2010-09-19 DIAGNOSIS — G450 Vertebro-basilar artery syndrome: Secondary | ICD-10-CM

## 2010-09-19 DIAGNOSIS — Z7901 Long term (current) use of anticoagulants: Secondary | ICD-10-CM

## 2010-09-19 DIAGNOSIS — I4891 Unspecified atrial fibrillation: Secondary | ICD-10-CM

## 2010-09-19 MED ORDER — DIGOXIN 250 MCG PO TABS: 125.0000 ug | ORAL_TABLET | Freq: Every day | ORAL | Status: DC

## 2010-09-19 NOTE — Assessment & Plan Note (Signed)
Stool for Hemoccult testing negative.  We will continue to assess INR and adjust medication dose as appropriate.

## 2010-09-19 NOTE — Telephone Encounter (Signed)
Pt's daughter called to report pt having difficulty breathing He has been sleeping in a recliner due to shortness of breath Edema in legs and feet has been fluctuating without true resolve Edema as bad as calf and ankle the same size at times Last bp and hr at home 146/81, 44

## 2010-09-19 NOTE — Patient Instructions (Signed)
Your physician recommends that you schedule a follow-up appointment in: 9 months Your physician recommends that you return for lab work in: today and 4 months

## 2010-09-19 NOTE — Assessment & Plan Note (Addendum)
Patient is asymptomatic validating his current treatment strategy of control of heart rate.  A 6 minute walk test was performed.  He covered 1200 feet with his heart rate increasing only slightly above 100 beats per minute.  Current therapy will be continued.  I will reassess this nice gentleman in 9 months.  As a result of treatment with diuretic, a chemistry profile will be reassessed today and in 4 months.

## 2010-09-19 NOTE — Telephone Encounter (Signed)
Patient's daughter wants to speak with nurse regarding patient's health / does not want patient to know that she is calling / tg

## 2010-09-19 NOTE — Progress Notes (Signed)
HPI : [name] returns to the office as scheduled for continued assessment and treatment of atrial arrhythmias.  Since his last visit, dyspnea has resolved.  He attributes this to treatment with low-dose furosemide although he has lost only 2 pounds.  He has not noted recurrent edema.  He denies orthopnea, PND, chest discomfort, lightheadedness and syncope.  Current Outpatient Prescriptions on File Prior to Visit  Medication Sig Dispense Refill  . digoxin (LANOXIN) 0.25 MG tablet Take 0.5 tablets (125 mcg total) by mouth daily.  15 tablet  3  . fenofibrate 160 MG tablet Take 160 mg by mouth daily.       . furosemide (LASIX) 40 MG tablet Increase to 1 tablet daily for increased wt gain  30 tablet  6  . metoprolol tartrate (LOPRESSOR) 25 MG tablet Take 1 tablet (25 mg total) by mouth 3 (three) times daily.  270 tablet  1  . omeprazole (PRILOSEC) 20 MG capsule Take 20 mg by mouth 2 (two) times daily.       Marland Kitchen warfarin (COUMADIN) 2.5 MG tablet Take 1 tablet (2.5 mg total) by mouth as directed.  45 tablet  3     Allergies  Allergen Reactions  . Simvastatin Other (See Comments)    Severe myalgias      Past medical history, social history, and family history reviewed and updated.  ROS: see HPI  PHYSICAL EXAM: BP 116/78  Pulse 136  Ht $R'5\' 5"'VQ$  (1.651 m)  Wt 91.627 kg (202 lb)  BMI 33.61 kg/m2  SpO2 95%  General-Well developed; no acute distress Body habitus-overweight Neck-No JVD; no carotid bruits Lungs-clear lung fields; resonant to percussion Cardiovascular-normal PMI; normal S1 and S2; irregular rhythm Abdomen-normal bowel sounds; soft and non-tender without masses or organomegaly Musculoskeletal-No deformities, no cyanosis or clubbing Neurologic-Normal cranial nerves; symmetric strength and tone Skin-Warm, no significant lesions Extremities-distal pulses intact; trace edema  EKG:  Atrial fibrillation with controlled ventricular response; rate of 88 bpm; left axis deviation;  delayed R-wave progression-cannot exclude previous anterior MI; right ventricular conduction delay; nonspecific T-wave abnormality; no previous tracing for comparison.  ASSESSMENT AND PLAN:

## 2010-09-20 LAB — BASIC METABOLIC PANEL
Glucose, Bld: 74 mg/dL (ref 70–99)
Potassium: 3.9 mEq/L (ref 3.5–5.3)
Sodium: 142 mEq/L (ref 135–145)

## 2010-09-23 ENCOUNTER — Encounter: Payer: Self-pay | Admitting: *Deleted

## 2010-10-08 ENCOUNTER — Ambulatory Visit: Payer: BLUE CROSS/BLUE SHIELD | Admitting: Cardiology

## 2010-10-17 ENCOUNTER — Ambulatory Visit (INDEPENDENT_AMBULATORY_CARE_PROVIDER_SITE_OTHER): Payer: Medicare Other | Admitting: *Deleted

## 2010-10-17 DIAGNOSIS — Z7901 Long term (current) use of anticoagulants: Secondary | ICD-10-CM

## 2010-10-17 DIAGNOSIS — G450 Vertebro-basilar artery syndrome: Secondary | ICD-10-CM

## 2010-10-17 DIAGNOSIS — I4891 Unspecified atrial fibrillation: Secondary | ICD-10-CM

## 2010-10-17 LAB — POCT INR: INR: 2.7

## 2010-11-14 ENCOUNTER — Ambulatory Visit (INDEPENDENT_AMBULATORY_CARE_PROVIDER_SITE_OTHER): Payer: Medicare Other | Admitting: *Deleted

## 2010-11-14 DIAGNOSIS — G450 Vertebro-basilar artery syndrome: Secondary | ICD-10-CM

## 2010-11-14 DIAGNOSIS — Z7901 Long term (current) use of anticoagulants: Secondary | ICD-10-CM

## 2010-11-14 DIAGNOSIS — I4891 Unspecified atrial fibrillation: Secondary | ICD-10-CM

## 2010-11-14 LAB — POCT INR: INR: 2.3

## 2010-12-12 ENCOUNTER — Ambulatory Visit (INDEPENDENT_AMBULATORY_CARE_PROVIDER_SITE_OTHER): Payer: Medicare Other | Admitting: *Deleted

## 2010-12-12 DIAGNOSIS — I4891 Unspecified atrial fibrillation: Secondary | ICD-10-CM

## 2010-12-12 DIAGNOSIS — G450 Vertebro-basilar artery syndrome: Secondary | ICD-10-CM

## 2010-12-12 DIAGNOSIS — Z7901 Long term (current) use of anticoagulants: Secondary | ICD-10-CM

## 2011-01-09 ENCOUNTER — Other Ambulatory Visit: Payer: Self-pay | Admitting: Cardiology

## 2011-01-09 ENCOUNTER — Ambulatory Visit (INDEPENDENT_AMBULATORY_CARE_PROVIDER_SITE_OTHER): Payer: Medicare Other | Admitting: *Deleted

## 2011-01-09 DIAGNOSIS — Z7901 Long term (current) use of anticoagulants: Secondary | ICD-10-CM

## 2011-01-09 DIAGNOSIS — G450 Vertebro-basilar artery syndrome: Secondary | ICD-10-CM

## 2011-01-09 DIAGNOSIS — I679 Cerebrovascular disease, unspecified: Secondary | ICD-10-CM

## 2011-01-09 DIAGNOSIS — IMO0002 Reserved for concepts with insufficient information to code with codable children: Secondary | ICD-10-CM

## 2011-01-09 DIAGNOSIS — I4891 Unspecified atrial fibrillation: Secondary | ICD-10-CM

## 2011-01-09 DIAGNOSIS — I4892 Unspecified atrial flutter: Secondary | ICD-10-CM

## 2011-01-10 LAB — BASIC METABOLIC PANEL
BUN: 19 mg/dL (ref 6–23)
CO2: 25 mEq/L (ref 19–32)
Calcium: 9.1 mg/dL (ref 8.4–10.5)
Glucose, Bld: 120 mg/dL — ABNORMAL HIGH (ref 70–99)
Sodium: 139 mEq/L (ref 135–145)

## 2011-01-15 ENCOUNTER — Telehealth: Payer: Self-pay | Admitting: Cardiology

## 2011-01-15 MED ORDER — METOPROLOL TARTRATE 25 MG PO TABS: 25.0000 mg | ORAL_TABLET | Freq: Three times a day (TID) | ORAL | Status: DC

## 2011-01-15 NOTE — Telephone Encounter (Signed)
Patient needs refill on Metoprolol $RemoveBefor'25mg'ynjWXBglegyM$  90 day supply sent to [facility] per patient's wife. / tg

## 2011-02-07 ENCOUNTER — Ambulatory Visit (INDEPENDENT_AMBULATORY_CARE_PROVIDER_SITE_OTHER): Payer: Medicare Other | Admitting: *Deleted

## 2011-02-07 DIAGNOSIS — IMO0002 Reserved for concepts with insufficient information to code with codable children: Secondary | ICD-10-CM

## 2011-02-07 DIAGNOSIS — Z7901 Long term (current) use of anticoagulants: Secondary | ICD-10-CM

## 2011-02-07 DIAGNOSIS — I679 Cerebrovascular disease, unspecified: Secondary | ICD-10-CM

## 2011-02-07 DIAGNOSIS — G450 Vertebro-basilar artery syndrome: Secondary | ICD-10-CM

## 2011-02-07 DIAGNOSIS — I4892 Unspecified atrial flutter: Secondary | ICD-10-CM

## 2011-02-07 DIAGNOSIS — I4891 Unspecified atrial fibrillation: Secondary | ICD-10-CM

## 2011-02-19 ENCOUNTER — Encounter (INDEPENDENT_AMBULATORY_CARE_PROVIDER_SITE_OTHER): Payer: Self-pay | Admitting: *Deleted

## 2011-02-28 ENCOUNTER — Telehealth: Payer: Self-pay | Admitting: Cardiology

## 2011-02-28 ENCOUNTER — Telehealth (INDEPENDENT_AMBULATORY_CARE_PROVIDER_SITE_OTHER): Payer: Self-pay | Admitting: *Deleted

## 2011-02-28 ENCOUNTER — Other Ambulatory Visit (INDEPENDENT_AMBULATORY_CARE_PROVIDER_SITE_OTHER): Payer: Self-pay | Admitting: *Deleted

## 2011-02-28 ENCOUNTER — Encounter (INDEPENDENT_AMBULATORY_CARE_PROVIDER_SITE_OTHER): Payer: Self-pay | Admitting: *Deleted

## 2011-02-28 DIAGNOSIS — Z86010 Personal history of colonic polyps: Secondary | ICD-10-CM

## 2011-02-28 NOTE — Telephone Encounter (Signed)
PT HAVING COLONOSCOPY DONE ON 04/03/10 AND NEEDS CLEARANCE TO BE OFF COUMADIN 5 DAYS PRIOR/TMJ

## 2011-02-28 NOTE — Telephone Encounter (Signed)
Patient needs movi prep 

## 2011-03-01 ENCOUNTER — Telehealth: Payer: Self-pay | Admitting: *Deleted

## 2011-03-01 NOTE — Telephone Encounter (Signed)
Please advise regarding coumadin request

## 2011-03-04 ENCOUNTER — Telehealth: Payer: Self-pay | Admitting: *Deleted

## 2011-03-04 ENCOUNTER — Encounter: Payer: Self-pay | Admitting: Cardiology

## 2011-03-04 NOTE — Telephone Encounter (Signed)
Warfarin can be interrupted for procedures/surgery.

## 2011-03-04 NOTE — Telephone Encounter (Signed)
Advised pts wife of Dr Earline Mayotte recommendations regarding coumadin cessation prior to colonoscopy.

## 2011-03-06 MED ORDER — PEG-KCL-NACL-NASULF-NA ASC-C 100 G PO SOLR: 1.0000 | Freq: Once | ORAL | Status: DC

## 2011-03-07 ENCOUNTER — Encounter: Payer: Medicare Other | Admitting: *Deleted

## 2011-03-12 ENCOUNTER — Telehealth (INDEPENDENT_AMBULATORY_CARE_PROVIDER_SITE_OTHER): Payer: Self-pay | Admitting: *Deleted

## 2011-03-12 NOTE — Telephone Encounter (Signed)
agree

## 2011-03-12 NOTE — Telephone Encounter (Signed)
Requesting MD:  Dr Hilma Favors   PCP: Dr Hilma Favors   Name: Daniel Morse  DOB: [birth year]   Procedure: TCS  Reason/Indication:  Surveillance, hx polyps  Has patient had this procedure before?  yes  If so, when, by whom and where?  2007  Is there a family history of colon cancer?  yes  Who?  What age when diagnosed?  uncles  Is patient diabetic?   no      Does patient have prosthetic heart valve?  no  Do you have a pacemaker?  no  Has patient had joint replacement within last 12 months?  no  Is patient on Coumadin, Plavix and/or Aspirin? yes   (Dr Lattie Haw) Medications: Coumadin 7.5 mg 1/2 tab tues, wed, thurs, sat & sun, 2.5 mg mon & fri, Celebrex 160 mg daily, Omeprazole 40 mg daily, ICAPS (eye vit) bid, fish oil 2 tab daily, Lasix 40 mg daily, Digoxin 0.25 mg 1/2 tab daily, metoprolol 25 mg 1 tab bid  Allergies: NKDA  Pharmacy: [facility]  Medication Adjustment: Coumadin 5 days  Other:   Procedure date & time: 04/04/11 @ 930

## 2011-03-25 ENCOUNTER — Encounter (HOSPITAL_COMMUNITY): Payer: Self-pay | Admitting: Pharmacy Technician

## 2011-04-03 MED ORDER — SODIUM CHLORIDE 0.45 % IV SOLN
Freq: Once | INTRAVENOUS | Status: AC
  Administered 2011-04-04: 09:00:00 via INTRAVENOUS

## 2011-04-04 ENCOUNTER — Other Ambulatory Visit (INDEPENDENT_AMBULATORY_CARE_PROVIDER_SITE_OTHER): Payer: Self-pay | Admitting: Internal Medicine

## 2011-04-04 ENCOUNTER — Encounter (HOSPITAL_COMMUNITY): Payer: Self-pay | Admitting: *Deleted

## 2011-04-04 ENCOUNTER — Ambulatory Visit (HOSPITAL_COMMUNITY)
Admission: RE | Admit: 2011-04-04 | Discharge: 2011-04-04 | Disposition: A | Discharge status: Home / Self Care | Payer: Medicare Other | Source: Ambulatory Visit | Attending: Internal Medicine | Admitting: Internal Medicine

## 2011-04-04 ENCOUNTER — Encounter (HOSPITAL_COMMUNITY)
Admission: RE | Disposition: A | Discharge status: Home / Self Care | Payer: Self-pay | Source: Ambulatory Visit | Attending: Internal Medicine

## 2011-04-04 DIAGNOSIS — K5730 Diverticulosis of large intestine without perforation or abscess without bleeding: Secondary | ICD-10-CM | POA: Insufficient documentation

## 2011-04-04 DIAGNOSIS — Z09 Encounter for follow-up examination after completed treatment for conditions other than malignant neoplasm: Secondary | ICD-10-CM

## 2011-04-04 DIAGNOSIS — Z860100 Personal history of colon polyps, unspecified: Secondary | ICD-10-CM | POA: Insufficient documentation

## 2011-04-04 DIAGNOSIS — D126 Benign neoplasm of colon, unspecified: Secondary | ICD-10-CM | POA: Insufficient documentation

## 2011-04-04 DIAGNOSIS — I10 Essential (primary) hypertension: Secondary | ICD-10-CM | POA: Insufficient documentation

## 2011-04-04 DIAGNOSIS — F17200 Nicotine dependence, unspecified, uncomplicated: Secondary | ICD-10-CM | POA: Insufficient documentation

## 2011-04-04 DIAGNOSIS — E785 Hyperlipidemia, unspecified: Secondary | ICD-10-CM | POA: Insufficient documentation

## 2011-04-04 DIAGNOSIS — Z1211 Encounter for screening for malignant neoplasm of colon: Secondary | ICD-10-CM

## 2011-04-04 DIAGNOSIS — Z79899 Other long term (current) drug therapy: Secondary | ICD-10-CM | POA: Insufficient documentation

## 2011-04-04 DIAGNOSIS — Z7901 Long term (current) use of anticoagulants: Secondary | ICD-10-CM | POA: Insufficient documentation

## 2011-04-04 HISTORY — PX: COLONOSCOPY: SHX5424

## 2011-04-04 SURGERY — COLONOSCOPY
Anesthesia: Moderate Sedation

## 2011-04-04 MED ORDER — MEPERIDINE HCL 50 MG/ML IJ SOLN
INTRAMUSCULAR | Status: AC
  Filled 2011-04-04: qty 1

## 2011-04-04 MED ORDER — MIDAZOLAM HCL 5 MG/5ML IJ SOLN
INTRAMUSCULAR | Status: DC | PRN
  Administered 2011-04-04: 2 mg via INTRAVENOUS
  Administered 2011-04-04: 1 mg via INTRAVENOUS
  Administered 2011-04-04: 2 mg via INTRAVENOUS

## 2011-04-04 MED ORDER — STERILE WATER FOR IRRIGATION IR SOLN
Status: DC | PRN
  Administered 2011-04-04: 09:00:00

## 2011-04-04 MED ORDER — MIDAZOLAM HCL 5 MG/5ML IJ SOLN
INTRAMUSCULAR | Status: AC
  Filled 2011-04-04: qty 10

## 2011-04-04 MED ORDER — MEPERIDINE HCL 50 MG/ML IJ SOLN
INTRAMUSCULAR | Status: DC | PRN
  Administered 2011-04-04 (×2): 25 mg via INTRAVENOUS

## 2011-04-04 NOTE — H&P (Signed)
Daniel Morse is an 71 y.o. male.   Chief Complaint: Patient is here for colonoscopy. HPI: Patient is 33 usual Caucasian male with history of colonic adenomas. He is here for surveillance colonoscopy. He denies diarrhea, constipation or rectal bleeding. He has gained about 10 pounds since he quit cigarette smoking. Family history is positive for colon carcinoma paternal uncle he was 94. Patient's last colonoscopy was in December 2007 when he had 4 small polyps removed and 2 were tubular adenomas.  Past Medical History  Diagnosis Date  . Hyperlipidemia     Metabolic syndrome with increased triglycerides  . Hypertension   . GERD (gastroesophageal reflux disease)     h/o esophagitis  . Atrial flutter 06/2010  . Nephrolithiasis   . Chest pain     negative stress nuclear in 11/2002  . Macular degeneration     legally blind  . Tobacco abuse     50 pack years; 1/2 pack per day  . Cerebrovascular disease 2008    Left CEA  . Chronic anticoagulation     Past Surgical History  Procedure Date  . Cystoscopy w/ ureteral stent placement     and stone extraction  . Carotid endarterectomy 2008    Left  . Colonoscopy X 2    4 small polyps ablated    Family History  Problem Relation Age of Onset  . Colon cancer Paternal Uncle    Social History:  reports that he has quit smoking. His smoking use included Cigarettes. He has a 25 pack-year smoking history. He quit smokeless tobacco use about 10 months ago. He reports that he drinks alcohol. He reports that he does not use illicit drugs.  Allergies:  Allergies  Allergen Reactions  . Simvastatin Other (See Comments)    Severe myalgias    Medications Prior to Admission  Medication Dose Route Frequency Provider Last Rate Last Dose  . 0.45 % sodium chloride infusion   Intravenous Once Rogene Houston, MD 20 mL/hr at 04/04/11 0835    . meperidine (DEMEROL) 50 MG/ML injection           . midazolam (VERSED) 5 MG/5ML injection             Medications Prior to Admission  Medication Sig Dispense Refill  . digoxin (LANOXIN) 0.25 MG tablet Take 0.5 tablets (125 mcg total) by mouth daily.  15 tablet  6  . fenofibrate 160 MG tablet Take 160 mg by mouth daily.       . furosemide (LASIX) 40 MG tablet Increase to 1 tablet daily for increased wt gain  30 tablet  6  . metoprolol tartrate (LOPRESSOR) 25 MG tablet Take 1 tablet (25 mg total) by mouth 3 (three) times daily.  270 tablet  3  . Multiple Vitamins-Minerals (EYE-VITES PO) Take 1 tablet by mouth daily.        . Omega-3 Fatty Acids (FISH OIL) 1000 MG CAPS Take 1 capsule by mouth 3 (three) times daily.        Marland Kitchen omeprazole (PRILOSEC) 20 MG capsule Take 20 mg by mouth 2 (two) times daily.       . peg 3350 powder (MOVIPREP) 100 G SOLR Take 1 kit (100 g total) by mouth once.  1 kit  0  . warfarin (COUMADIN) 2.5 MG tablet Take 2.5 mg by mouth daily. MON & FRI        No results found for this or any previous visit (from the past 48 hour(s)). No  results found.  Review of Systems  Constitutional: Negative for weight loss.  Gastrointestinal: Negative for abdominal pain, diarrhea, constipation, blood in stool and melena.    Blood pressure 125/91, pulse 91, temperature 98.1 F (36.7 C), temperature source Oral, resp. rate 24, height $RemoveBe'5\' 5"'ZGEbOSVKm$  (1.651 m), weight 210 lb (95.255 kg), SpO2 94.00%. Physical Exam  Constitutional: He appears well-developed and well-nourished.  HENT:  Mouth/Throat: Oropharynx is clear and moist.  Eyes: Conjunctivae are normal.  Neck: No thyromegaly present.  Cardiovascular: Normal rate, regular rhythm and normal heart sounds.   No murmur heard. Respiratory: Effort normal and breath sounds normal.  GI: Soft. Bowel sounds are normal. He exhibits no mass. There is no tenderness.  Musculoskeletal: He exhibits no edema.  Lymphadenopathy:    He has no cervical adenopathy.  Neurological: He is alert.  Skin: Skin is warm and dry.     Assessment/Plan History of  colonic polyps. Surveillance colonoscopy.  [name],NAJEEB U 04/04/2011, 9:07 AM

## 2011-04-04 NOTE — Op Note (Signed)
COLONOSCOPY PROCEDURE REPORT  PATIENT:  Daniel Morse  MR#:  [id number] Birthdate:  1940/04/22, 71 y.o., male Endoscopist:  Dr. Rogene Houston, MD Referred By:  Dr. Frederick Peers, MD Procedure Date: 04/04/2011  Procedure:   Colonoscopy  Indications:  Patient is a 71 year old Caucasian male with history of colonic polyps. His last exam was in December 2010.  Informed Consent:  Procedure and risks were reviewed with the patient and informed consent was obtained.  Medications:  Demerol 50 mg IV Versed 5 mg IV  Description of procedure:  After a digital rectal exam was performed, that colonoscope was advanced from the anus through the rectum and colon to the area of the cecum, ileocecal valve and appendiceal orifice. The cecum was deeply intubated. These structures were well-seen and photographed for the record. From the level of the cecum and ileocecal valve, the scope was slowly and cautiously withdrawn. The mucosal surfaces were carefully surveyed utilizing scope tip to flexion to facilitate fold flattening as needed. The scope was pulled down into the rectum where a thorough exam including retroflexion was performed.  Findings:   Prep excellent. Few small diverticula and sigmoid colon. Small polyp ablated via cold biopsy from ascending colon. Three small polyps located close to one another at hepatic flexure. All of these are ablated via cold biopsy and submitted in one container. Normal rectal mucosa and anal rectal junction.   Therapeutic/Diagnostic Maneuvers Performed:  See above  Complications:  None  Cecal Withdrawal Time:  13 minutes  Impression:  Examination performed to cecum. Few small diverticula and sigmoid colon. Small polyp ablated via cold biopsy from ascending colon. Three small polyps ablated via cold biopsy from splenic flexure and submitted in one container.  Recommendations:  Standard instructions given. I will contact patient with results of  biopsy. Patient will resume warfarin at usual dose today.  Sidrah Harden U  04/04/2011 9:53 AM  CC: Dr. Purvis Kilts, MD, MD & Dr. Rayne Du ref. provider found

## 2011-04-05 ENCOUNTER — Other Ambulatory Visit: Payer: Self-pay | Admitting: Cardiology

## 2011-04-05 NOTE — Telephone Encounter (Signed)
Lasix needs refilled pt is out . Please call in to [place].

## 2011-04-09 ENCOUNTER — Encounter (HOSPITAL_COMMUNITY): Payer: Self-pay | Admitting: Internal Medicine

## 2011-04-15 ENCOUNTER — Encounter (INDEPENDENT_AMBULATORY_CARE_PROVIDER_SITE_OTHER): Payer: Self-pay | Admitting: *Deleted

## 2011-04-15 ENCOUNTER — Other Ambulatory Visit: Payer: Self-pay | Admitting: Cardiology

## 2011-05-01 ENCOUNTER — Other Ambulatory Visit (HOSPITAL_COMMUNITY): Payer: Self-pay | Admitting: Family Medicine

## 2011-05-01 ENCOUNTER — Encounter: Payer: Self-pay | Admitting: Cardiology

## 2011-05-01 DIAGNOSIS — I10 Essential (primary) hypertension: Secondary | ICD-10-CM

## 2011-05-06 ENCOUNTER — Ambulatory Visit (HOSPITAL_COMMUNITY)
Admission: RE | Admit: 2011-05-06 | Discharge: 2011-05-06 | Disposition: A | Discharge status: Home / Self Care | Payer: Medicare Other | Source: Ambulatory Visit | Attending: Family Medicine | Admitting: Family Medicine

## 2011-05-06 ENCOUNTER — Ambulatory Visit (INDEPENDENT_AMBULATORY_CARE_PROVIDER_SITE_OTHER): Payer: Medicare Other | Admitting: *Deleted

## 2011-05-06 DIAGNOSIS — I679 Cerebrovascular disease, unspecified: Secondary | ICD-10-CM

## 2011-05-06 DIAGNOSIS — Z8679 Personal history of other diseases of the circulatory system: Secondary | ICD-10-CM | POA: Insufficient documentation

## 2011-05-06 DIAGNOSIS — G450 Vertebro-basilar artery syndrome: Secondary | ICD-10-CM

## 2011-05-06 DIAGNOSIS — Z9889 Other specified postprocedural states: Secondary | ICD-10-CM | POA: Insufficient documentation

## 2011-05-06 DIAGNOSIS — IMO0002 Reserved for concepts with insufficient information to code with codable children: Secondary | ICD-10-CM

## 2011-05-06 DIAGNOSIS — I4891 Unspecified atrial fibrillation: Secondary | ICD-10-CM

## 2011-05-06 DIAGNOSIS — F17200 Nicotine dependence, unspecified, uncomplicated: Secondary | ICD-10-CM | POA: Insufficient documentation

## 2011-05-06 DIAGNOSIS — I6529 Occlusion and stenosis of unspecified carotid artery: Secondary | ICD-10-CM | POA: Insufficient documentation

## 2011-05-06 DIAGNOSIS — Z7901 Long term (current) use of anticoagulants: Secondary | ICD-10-CM

## 2011-05-06 DIAGNOSIS — I10 Essential (primary) hypertension: Secondary | ICD-10-CM

## 2011-05-06 LAB — POCT INR: INR: 2.6

## 2011-06-17 ENCOUNTER — Encounter: Payer: Self-pay | Admitting: Cardiology

## 2011-06-17 ENCOUNTER — Ambulatory Visit (INDEPENDENT_AMBULATORY_CARE_PROVIDER_SITE_OTHER): Payer: Medicare Other | Admitting: *Deleted

## 2011-06-17 ENCOUNTER — Ambulatory Visit (INDEPENDENT_AMBULATORY_CARE_PROVIDER_SITE_OTHER): Payer: Medicare Other | Admitting: Cardiology

## 2011-06-17 ENCOUNTER — Encounter: Payer: Self-pay | Admitting: *Deleted

## 2011-06-17 VITALS — BP 122/87 | HR 99 | Resp 16 | Ht 66.0 in | Wt 210.0 lb

## 2011-06-17 DIAGNOSIS — E785 Hyperlipidemia, unspecified: Secondary | ICD-10-CM

## 2011-06-17 DIAGNOSIS — Z7901 Long term (current) use of anticoagulants: Secondary | ICD-10-CM

## 2011-06-17 DIAGNOSIS — IMO0002 Reserved for concepts with insufficient information to code with codable children: Secondary | ICD-10-CM

## 2011-06-17 DIAGNOSIS — I679 Cerebrovascular disease, unspecified: Secondary | ICD-10-CM

## 2011-06-17 DIAGNOSIS — I10 Essential (primary) hypertension: Secondary | ICD-10-CM

## 2011-06-17 DIAGNOSIS — I4891 Unspecified atrial fibrillation: Secondary | ICD-10-CM

## 2011-06-17 DIAGNOSIS — G450 Vertebro-basilar artery syndrome: Secondary | ICD-10-CM

## 2011-06-17 MED ORDER — WARFARIN SODIUM 7.5 MG PO TABS: 7.5000 mg | ORAL_TABLET | Freq: Every day | ORAL | Status: DC

## 2011-06-17 MED ORDER — COLESEVELAM HCL 625 MG PO TABS: 1250.0000 mg | ORAL_TABLET | Freq: Three times a day (TID) | ORAL | Status: DC

## 2011-06-17 MED ORDER — FENOFIBRATE 160 MG PO TABS: 160.0000 mg | ORAL_TABLET | Freq: Every day | ORAL | Status: DC

## 2011-06-17 MED ORDER — WARFARIN SODIUM 2.5 MG PO TABS: 2.5000 mg | ORAL_TABLET | Freq: Every day | ORAL | Status: DC

## 2011-06-17 MED ORDER — EZETIMIBE 10 MG PO TABS: 10.0000 mg | ORAL_TABLET | Freq: Every day | ORAL | Status: DC

## 2011-06-17 MED ORDER — FUROSEMIDE 40 MG PO TABS: 40.0000 mg | ORAL_TABLET | Freq: Every day | ORAL | Status: DC

## 2011-06-17 MED ORDER — DIGOXIN 250 MCG PO TABS: 250.0000 ug | ORAL_TABLET | Freq: Every day | ORAL | Status: DC

## 2011-06-17 NOTE — Assessment & Plan Note (Signed)
Recent CBC apparently normal.  Repeat stool Hemoccult studies will be performed to exclude occult GI blood loss.

## 2011-06-17 NOTE — Progress Notes (Deleted)
Name: Daniel Morse    DOB: 1940-06-09  Age: 71 y.o.  MR#: [id number]       PCP:  Purvis Kilts, MD, MD      Insurance: $[email or web address]$ @   CC:    Chief Complaint  Patient presents with  . Appointment    no complaints +med list    VS BP 122/87  Pulse 99  Resp 16  Ht $R'5\' 6"'Qy$  (1.676 m)  Wt 210 lb (95.255 kg)  BMI 33.89 kg/m2  Weights Current Weight  06/17/11 210 lb (95.255 kg)  04/04/11 210 lb (95.255 kg)  04/04/11 210 lb (95.255 kg)    Blood Pressure  BP Readings from Last 3 Encounters:  06/17/11 122/87  04/04/11 118/85  04/04/11 118/85     Admit date:  (Not on file) Last encounter with RMR:  05/01/2011   Allergy Allergies  Allergen Reactions  . Simvastatin Other (See Comments)    Severe myalgias    Current Outpatient Prescriptions  Medication Sig Dispense Refill  . digoxin (LANOXIN) 0.25 MG tablet TAKE ONE-HALF TABLET BY MOUTH EVERY DAY  15 tablet  6  . fenofibrate 160 MG tablet Take 160 mg by mouth daily.       . furosemide (LASIX) 40 MG tablet TAKE ONE TABLET BY MOUTH EVERY DAY FOR INCREASED WEIGHT GAIN **REPLACES 20 MG**  90 tablet  3  . metoprolol tartrate (LOPRESSOR) 25 MG tablet Take 1 tablet (25 mg total) by mouth 3 (three) times daily.  270 tablet  3  . Multiple Vitamins-Minerals (EYE-VITES PO) Take 1 tablet by mouth daily.        . Omega-3 Fatty Acids (FISH OIL) 1000 MG CAPS Take 1 capsule by mouth 3 (three) times daily.       Marland Kitchen omeprazole (PRILOSEC) 20 MG capsule Take 20 mg by mouth every other day.      . warfarin (COUMADIN) 2.5 MG tablet Take 2.5 mg by mouth daily. MON & FRI       . warfarin (COUMADIN) 7.5 MG tablet Take 7.5 mg by mouth daily. Tues, Wed, Thurs, & Sun         Discontinued Meds:    Medications Discontinued During This Encounter  Medication Reason  . omeprazole (PRILOSEC) 20 MG capsule Error    Patient Active Problem List  Diagnoses  . Hyperlipidemia  . Hypertension  . GERD (gastroesophageal reflux disease)  . Atrial  fibrillation or flutter  . Chronic anticoagulation  . Tobacco abuse  . Cerebrovascular disease  . Nephrolithiasis    LABS Anti-coag visit on 06/17/2011  Component Date Value  . INR 06/17/2011 2.8   Anti-coag visit on 05/06/2011  Component Date Value  . INR 05/06/2011 2.6      Results for this Opt Visit:     Results for orders placed in visit on 06/17/11  POCT INR      Component Value Range   INR 2.8      EKG Orders placed in visit on 09/19/10  . EKG 12-LEAD     Prior Assessment and Plan Problem List as of 06/17/2011          Cardiology Problems   Hyperlipidemia   Hypertension   Cerebrovascular disease     Other   GERD (gastroesophageal reflux disease)   Atrial fibrillation or flutter   Last Assessment & Plan Note   09/19/2010 Office Visit Addendum 09/19/2010  2:47 PM by Yehuda Savannah, MD    Patient is asymptomatic validating his current treatment  strategy of control of heart rate.  A 6 minute walk test was performed.  He covered 1200 feet with his heart rate increasing only slightly above 100 beats per minute.  Current therapy will be continued.  I will reassess this nice gentleman in 9 months.  As a result of treatment with diuretic, a chemistry profile will be reassessed today and in 4 months.    Chronic anticoagulation   Last Assessment & Plan Note   09/19/2010 Office Visit Signed 09/19/2010  2:37 PM by Yehuda Savannah, MD    Stool for Hemoccult testing negative.  We will continue to assess INR and adjust medication dose as appropriate.    Tobacco abuse   Last Assessment & Plan Note   08/20/2010 Office Visit Signed 08/20/2010  5:05 PM by Lendon Colonel, NP    Cessation is recommended.    Nephrolithiasis       Imaging: No results found.   FRS Calculation: Score not calculated. Missing: Total Cholesterol

## 2011-06-17 NOTE — Progress Notes (Signed)
Patient ID: Daniel Morse, male   DOB: 02-03-1941, 71 y.o.   MRN: [id number]  HPI: Scheduled return visit for this very nice gentleman with long-standing atrial fibrillation.  He continues to tolerate anticoagulation well with stable and therapeutic INRs and no apparent blood loss.  He requires colonoscopy every 5 years, most recently in January of this year when multiple polypectomies were performed.  He remains active around his home without any exertional dyspnea or chest discomfort.  Patient is proud that he discontinued cigarette smoking one year ago.  Prior to Admission medications   Medication Sig Start Date End Date Taking? Authorizing Provider  digoxin (LANOXIN) 0.25 MG tablet TAKE ONE-HALF TABLET BY MOUTH EVERY DAY 04/15/11  Yes Yehuda Savannah, MD  fenofibrate 160 MG tablet Take 160 mg by mouth daily.  06/12/10  Yes Historical Provider, MD  furosemide (LASIX) 40 MG tablet TAKE ONE TABLET BY MOUTH EVERY DAY FOR INCREASED WEIGHT GAIN **REPLACES 20 MG** 04/05/11  Yes Yehuda Savannah, MD  metoprolol tartrate (LOPRESSOR) 25 MG tablet Take 1 tablet (25 mg total) by mouth 3 (three) times daily. 01/15/11 01/15/12 Yes Yehuda Savannah, MD  Multiple Vitamins-Minerals (EYE-VITES PO) Take 1 tablet by mouth daily.     Yes Historical Provider, MD  Omega-3 Fatty Acids (FISH OIL) 1000 MG CAPS Take 1 capsule by mouth 3 (three) times daily.    Yes Historical Provider, MD  omeprazole (PRILOSEC) 20 MG capsule Take 20 mg by mouth every other day.   Yes Historical Provider, MD  warfarin (COUMADIN) 2.5 MG tablet Take 2.5 mg by mouth daily. MON & FRI  07/26/10  Yes Yehuda Savannah, MD  warfarin (COUMADIN) 7.5 MG tablet Take 7.5 mg by mouth daily. [place], Thurs, & Sun    Yes Historical Provider, MD   Allergies  Allergen Reactions  . Simvastatin Other (See Comments)    Severe myalgias   Past medical history, social history, and family history reviewed and updated.  ROS: Denies orthopnea, PND,  lightheadedness, palpitations or syncope.  He occasionally coughs when eating, but has had no recurrence of esophageal stricture since multiple dilatations performed some years ago.  He has no gastroesophageal reflux disease symptoms and has decreased his dose of omeprazole dramatically, currently taking 20 mg every few days. He notes mild edema, but this is well controlled with diuretic.  He calls attention to punctate angiomata over the skin of his foot that he has noted more frequently of late, particularly when he has worked in the yard.   All other systems reviewed and are negative.  PHYSICAL EXAM: BP 122/87  Pulse 99  Resp 16  Ht $R'5\' 6"'Ua$  (1.676 m)  Wt 95.255 kg (210 lb)  BMI 33.89 kg/m2  General-Well developed; no acute distress Body habitus-Moderately overweight Neck-No JVD; no carotid bruits; left carotid endarterectomy scar Lungs-clear lung fields; resonant to percussion Cardiovascular-normal PMI; distant S1 and S2; irregular; minimal systolic murmur Abdomen-normal bowel sounds; soft and non-tender without masses or organomegaly Musculoskeletal-No deformities, no cyanosis or clubbing Neurologic-Normal cranial nerves; symmetric strength and tone Skin-Warm, no significant lesions Extremities-distal pulses intact; 1/2+ ankle edema  ASSESSMENT AND PLAN:  Jacqulyn Ducking, MD 06/17/2011 12:20 PM

## 2011-06-17 NOTE — Patient Instructions (Addendum)
Your physician recommends that you schedule a follow-up appointment in: [place] AS SOON AS POSSIBLE  ADDENDUM AFTER DISCHARGE: (PT CALLED)  Your physician has recommended you make the following change in your medication:  1 - START WELCHOL 2 TABLETS TID 2 - START ZETIA 10 MG DAILY  LIPID PROFILE IN 1 MONTH

## 2011-06-17 NOTE — Assessment & Plan Note (Addendum)
Patient is doing well following left carotid endarterectomy.  Continued surveillance with periodic carotid ultrasound studies will be appropriate.

## 2011-06-17 NOTE — Assessment & Plan Note (Addendum)
Inadequate control of hyperlipidemia, in the setting of significant cerebrovascular disease.  Patient has been statin intolerant.  Will add WelChol and ezetimibe.  Repeat Lipid profile in one month.  He will likely require even more medical therapy.

## 2011-06-17 NOTE — Assessment & Plan Note (Signed)
No symptoms attributable to atrial arrhythmias.  Current strategy of rate control and anticoagulation will be continued.

## 2011-06-21 ENCOUNTER — Other Ambulatory Visit: Payer: Self-pay | Admitting: *Deleted

## 2011-06-21 MED ORDER — COLESEVELAM HCL 625 MG PO TABS: 1250.0000 mg | ORAL_TABLET | Freq: Three times a day (TID) | ORAL | Status: DC

## 2011-06-23 NOTE — Assessment & Plan Note (Signed)
Blood pressure has been well controlled over the past 10 months.  Current medications will be continued.

## 2011-06-24 ENCOUNTER — Encounter (INDEPENDENT_AMBULATORY_CARE_PROVIDER_SITE_OTHER): Payer: Medicare Other

## 2011-06-24 DIAGNOSIS — Z7901 Long term (current) use of anticoagulants: Secondary | ICD-10-CM

## 2011-06-24 NOTE — Progress Notes (Signed)
**Note De-Identified Merl Guardino Obfuscation** Addended by: Dennie Fetters on: 06/24/2011 03:33 PM   Modules accepted: Orders

## 2011-07-11 ENCOUNTER — Other Ambulatory Visit: Payer: Self-pay | Admitting: *Deleted

## 2011-07-11 DIAGNOSIS — E782 Mixed hyperlipidemia: Secondary | ICD-10-CM

## 2011-07-24 ENCOUNTER — Encounter: Payer: Self-pay | Admitting: *Deleted

## 2011-08-05 ENCOUNTER — Ambulatory Visit (INDEPENDENT_AMBULATORY_CARE_PROVIDER_SITE_OTHER): Payer: Medicare Other | Admitting: *Deleted

## 2011-08-05 DIAGNOSIS — I4891 Unspecified atrial fibrillation: Secondary | ICD-10-CM

## 2011-08-05 DIAGNOSIS — Z7901 Long term (current) use of anticoagulants: Secondary | ICD-10-CM

## 2011-08-05 DIAGNOSIS — I679 Cerebrovascular disease, unspecified: Secondary | ICD-10-CM

## 2011-08-05 DIAGNOSIS — G450 Vertebro-basilar artery syndrome: Secondary | ICD-10-CM

## 2011-08-05 DIAGNOSIS — IMO0002 Reserved for concepts with insufficient information to code with codable children: Secondary | ICD-10-CM

## 2011-08-29 ENCOUNTER — Other Ambulatory Visit: Payer: Self-pay | Admitting: Cardiology

## 2011-08-29 MED ORDER — WARFARIN SODIUM 2.5 MG PO TABS: 2.5000 mg | ORAL_TABLET | Freq: Every day | ORAL | Status: DC

## 2011-08-29 MED ORDER — WARFARIN SODIUM 7.5 MG PO TABS: 7.5000 mg | ORAL_TABLET | Freq: Every day | ORAL | Status: DC

## 2011-09-02 ENCOUNTER — Ambulatory Visit (INDEPENDENT_AMBULATORY_CARE_PROVIDER_SITE_OTHER): Payer: Medicare Other | Admitting: *Deleted

## 2011-09-02 DIAGNOSIS — G450 Vertebro-basilar artery syndrome: Secondary | ICD-10-CM

## 2011-09-02 DIAGNOSIS — I679 Cerebrovascular disease, unspecified: Secondary | ICD-10-CM

## 2011-09-02 DIAGNOSIS — IMO0002 Reserved for concepts with insufficient information to code with codable children: Secondary | ICD-10-CM

## 2011-09-02 DIAGNOSIS — Z7901 Long term (current) use of anticoagulants: Secondary | ICD-10-CM

## 2011-09-02 DIAGNOSIS — I4891 Unspecified atrial fibrillation: Secondary | ICD-10-CM

## 2011-09-02 MED ORDER — WARFARIN SODIUM 2.5 MG PO TABS: 2.5000 mg | ORAL_TABLET | Freq: Every day | ORAL | Status: DC

## 2011-09-23 ENCOUNTER — Ambulatory Visit (INDEPENDENT_AMBULATORY_CARE_PROVIDER_SITE_OTHER): Payer: Medicare Other | Admitting: *Deleted

## 2011-09-23 DIAGNOSIS — I679 Cerebrovascular disease, unspecified: Secondary | ICD-10-CM

## 2011-09-23 DIAGNOSIS — IMO0002 Reserved for concepts with insufficient information to code with codable children: Secondary | ICD-10-CM

## 2011-09-23 DIAGNOSIS — Z7901 Long term (current) use of anticoagulants: Secondary | ICD-10-CM

## 2011-09-23 DIAGNOSIS — G450 Vertebro-basilar artery syndrome: Secondary | ICD-10-CM

## 2011-09-23 DIAGNOSIS — I4891 Unspecified atrial fibrillation: Secondary | ICD-10-CM

## 2011-09-23 LAB — POCT INR: INR: 2

## 2011-10-21 ENCOUNTER — Ambulatory Visit (INDEPENDENT_AMBULATORY_CARE_PROVIDER_SITE_OTHER): Payer: Medicare Other | Admitting: *Deleted

## 2011-10-21 DIAGNOSIS — I679 Cerebrovascular disease, unspecified: Secondary | ICD-10-CM

## 2011-10-21 DIAGNOSIS — G450 Vertebro-basilar artery syndrome: Secondary | ICD-10-CM

## 2011-10-21 DIAGNOSIS — I4891 Unspecified atrial fibrillation: Secondary | ICD-10-CM

## 2011-10-21 DIAGNOSIS — Z7901 Long term (current) use of anticoagulants: Secondary | ICD-10-CM

## 2011-10-21 DIAGNOSIS — IMO0002 Reserved for concepts with insufficient information to code with codable children: Secondary | ICD-10-CM

## 2011-10-21 LAB — POCT INR: INR: 2.3

## 2011-11-08 ENCOUNTER — Other Ambulatory Visit: Payer: Self-pay | Admitting: Cardiology

## 2011-11-08 MED ORDER — WARFARIN SODIUM 2.5 MG PO TABS: 2.5000 mg | ORAL_TABLET | Freq: Every day | ORAL | Status: DC

## 2011-11-08 NOTE — Telephone Encounter (Signed)
Please send RX to [facility]. / tg

## 2011-11-11 ENCOUNTER — Telehealth: Payer: Self-pay | Admitting: Cardiology

## 2011-11-11 NOTE — Telephone Encounter (Signed)
[facility] LEFT MESSAGE ON MACHINE ( THEY DID NOT LEAVE CALL BACK NUMBER) THAT THEY NEED CLARIFICATION ON DIRECTIONS FOR WARFARIN REFILL THAT WAS CALLED IN

## 2011-11-12 NOTE — Telephone Encounter (Signed)
Could you please clarify this for me. Thank you

## 2011-11-13 NOTE — Telephone Encounter (Signed)
Current coumadin dose faxed to [facility] today.

## 2011-11-21 ENCOUNTER — Telehealth: Payer: Self-pay | Admitting: Cardiology

## 2011-11-21 NOTE — Telephone Encounter (Signed)
PT STATES THAT [facility] WILL NOT REFILL WARFARIN BECAUSE WE DID NOT PROVIDE DIRECTIONS

## 2011-11-22 ENCOUNTER — Other Ambulatory Visit: Payer: Self-pay | Admitting: *Deleted

## 2011-11-22 MED ORDER — WARFARIN SODIUM 2.5 MG PO TABS: 2.5000 mg | ORAL_TABLET | Freq: Every day | ORAL | Status: DC

## 2011-12-16 ENCOUNTER — Ambulatory Visit (INDEPENDENT_AMBULATORY_CARE_PROVIDER_SITE_OTHER): Payer: Medicare Other | Admitting: *Deleted

## 2011-12-16 DIAGNOSIS — IMO0002 Reserved for concepts with insufficient information to code with codable children: Secondary | ICD-10-CM

## 2011-12-16 DIAGNOSIS — G450 Vertebro-basilar artery syndrome: Secondary | ICD-10-CM

## 2011-12-16 DIAGNOSIS — Z7901 Long term (current) use of anticoagulants: Secondary | ICD-10-CM

## 2011-12-16 DIAGNOSIS — I679 Cerebrovascular disease, unspecified: Secondary | ICD-10-CM

## 2011-12-16 DIAGNOSIS — I4891 Unspecified atrial fibrillation: Secondary | ICD-10-CM

## 2011-12-16 MED ORDER — WARFARIN SODIUM 2.5 MG PO TABS: 135.0000 mg | ORAL_TABLET | Freq: Every day | ORAL | Status: DC

## 2011-12-19 ENCOUNTER — Other Ambulatory Visit: Payer: Self-pay | Admitting: Cardiology

## 2011-12-19 MED ORDER — WARFARIN SODIUM 2.5 MG PO TABS: 2.5000 mg | ORAL_TABLET | Freq: Every day | ORAL | Status: DC

## 2011-12-25 ENCOUNTER — Other Ambulatory Visit: Payer: Self-pay | Admitting: *Deleted

## 2011-12-25 MED ORDER — WARFARIN SODIUM 2.5 MG PO TABS: 2.5000 mg | ORAL_TABLET | Freq: Every day | ORAL | Status: DC

## 2011-12-26 ENCOUNTER — Other Ambulatory Visit: Payer: Self-pay | Admitting: *Deleted

## 2012-01-06 ENCOUNTER — Ambulatory Visit (INDEPENDENT_AMBULATORY_CARE_PROVIDER_SITE_OTHER): Payer: Medicare Other | Admitting: *Deleted

## 2012-01-06 DIAGNOSIS — IMO0002 Reserved for concepts with insufficient information to code with codable children: Secondary | ICD-10-CM

## 2012-01-06 DIAGNOSIS — G450 Vertebro-basilar artery syndrome: Secondary | ICD-10-CM

## 2012-01-06 DIAGNOSIS — I679 Cerebrovascular disease, unspecified: Secondary | ICD-10-CM

## 2012-01-06 DIAGNOSIS — Z7901 Long term (current) use of anticoagulants: Secondary | ICD-10-CM

## 2012-01-06 DIAGNOSIS — I4891 Unspecified atrial fibrillation: Secondary | ICD-10-CM

## 2012-02-03 ENCOUNTER — Ambulatory Visit (INDEPENDENT_AMBULATORY_CARE_PROVIDER_SITE_OTHER): Payer: Medicare Other | Admitting: *Deleted

## 2012-02-03 DIAGNOSIS — IMO0002 Reserved for concepts with insufficient information to code with codable children: Secondary | ICD-10-CM

## 2012-02-03 DIAGNOSIS — Z7901 Long term (current) use of anticoagulants: Secondary | ICD-10-CM

## 2012-02-03 DIAGNOSIS — I4891 Unspecified atrial fibrillation: Secondary | ICD-10-CM

## 2012-02-03 DIAGNOSIS — I679 Cerebrovascular disease, unspecified: Secondary | ICD-10-CM

## 2012-02-03 DIAGNOSIS — G450 Vertebro-basilar artery syndrome: Secondary | ICD-10-CM

## 2012-03-09 ENCOUNTER — Other Ambulatory Visit: Payer: Self-pay | Admitting: *Deleted

## 2012-03-09 ENCOUNTER — Ambulatory Visit (INDEPENDENT_AMBULATORY_CARE_PROVIDER_SITE_OTHER): Payer: Medicare Other | Admitting: *Deleted

## 2012-03-09 DIAGNOSIS — I679 Cerebrovascular disease, unspecified: Secondary | ICD-10-CM

## 2012-03-09 DIAGNOSIS — IMO0002 Reserved for concepts with insufficient information to code with codable children: Secondary | ICD-10-CM

## 2012-03-09 DIAGNOSIS — I4891 Unspecified atrial fibrillation: Secondary | ICD-10-CM

## 2012-03-09 DIAGNOSIS — Z7901 Long term (current) use of anticoagulants: Secondary | ICD-10-CM

## 2012-03-09 DIAGNOSIS — G450 Vertebro-basilar artery syndrome: Secondary | ICD-10-CM

## 2012-03-09 MED ORDER — METOPROLOL TARTRATE 25 MG PO TABS: 25.0000 mg | ORAL_TABLET | Freq: Three times a day (TID) | ORAL | Status: DC

## 2012-03-09 NOTE — Telephone Encounter (Signed)
Pt came into office for coumadin clinic and stopped to ask this nurse for mail order refills on metroprolol to be sent to mail order, noted pt has recall for apt with RR in April, sent medication via escribe for supply to last until upcoming apt, pt questioned how he needs to take his digoxin, noted pt last OV notation to continue to take half a tablet of the 0.$Remove'25mg'VUzcPok$  tablet as he has been taking, no changes noted for that medication, pt states that is how he has been taking but wanted to make sure that is correct, pt understood all directions given

## 2012-04-13 ENCOUNTER — Ambulatory Visit (INDEPENDENT_AMBULATORY_CARE_PROVIDER_SITE_OTHER): Payer: Medicare Other | Admitting: *Deleted

## 2012-04-13 DIAGNOSIS — I4891 Unspecified atrial fibrillation: Secondary | ICD-10-CM

## 2012-04-13 DIAGNOSIS — G450 Vertebro-basilar artery syndrome: Secondary | ICD-10-CM

## 2012-04-13 DIAGNOSIS — IMO0002 Reserved for concepts with insufficient information to code with codable children: Secondary | ICD-10-CM

## 2012-04-13 DIAGNOSIS — I679 Cerebrovascular disease, unspecified: Secondary | ICD-10-CM

## 2012-04-13 DIAGNOSIS — Z7901 Long term (current) use of anticoagulants: Secondary | ICD-10-CM

## 2012-04-13 LAB — POCT INR: INR: 1.8

## 2012-05-04 ENCOUNTER — Telehealth: Payer: Self-pay | Admitting: Cardiology

## 2012-05-04 NOTE — Telephone Encounter (Signed)
Spoke with wife and daughter, who state that he has developed severe DOE and has been to his PCP, who states he needs to be evaluated by our office.  Appointment offered for Friday March 7 th with Curt Bears.  Wife states that he will not come to ED, as advised and will not see anyone but Dr Lattie Haw.  Due to the weather and multiple schedule changes, Curt Bears is available and we will keep the appointment open, which he may cancel at his discretion, however, with his symptoms and history, he was strongly encouraged to seek medication attention.

## 2012-05-04 NOTE — Telephone Encounter (Signed)
PT CAN NOT WALK ANY DISTANCE WITH OUT SWEATING. HE GETS OUT OF BREATH. EXTREMELY FATIGUE. SHE FEELS LIKE HE HAS A BLOCKAGE

## 2012-05-08 ENCOUNTER — Ambulatory Visit: Payer: Medicare Other | Admitting: Adult Health

## 2012-05-10 ENCOUNTER — Encounter (HOSPITAL_COMMUNITY): Payer: Self-pay

## 2012-05-10 ENCOUNTER — Emergency Department (HOSPITAL_COMMUNITY)
Admission: EM | Admit: 2012-05-10 | Discharge: 2012-05-10 | Disposition: A | Discharge status: Home / Self Care | Payer: Medicare Other | Attending: Emergency Medicine | Admitting: Emergency Medicine

## 2012-05-10 ENCOUNTER — Emergency Department (HOSPITAL_COMMUNITY): Payer: Medicare Other

## 2012-05-10 DIAGNOSIS — Z87891 Personal history of nicotine dependence: Secondary | ICD-10-CM | POA: Insufficient documentation

## 2012-05-10 DIAGNOSIS — Y929 Unspecified place or not applicable: Secondary | ICD-10-CM | POA: Insufficient documentation

## 2012-05-10 DIAGNOSIS — Z860100 Personal history of colon polyps, unspecified: Secondary | ICD-10-CM | POA: Insufficient documentation

## 2012-05-10 DIAGNOSIS — Z79899 Other long term (current) drug therapy: Secondary | ICD-10-CM | POA: Insufficient documentation

## 2012-05-10 DIAGNOSIS — I10 Essential (primary) hypertension: Secondary | ICD-10-CM | POA: Insufficient documentation

## 2012-05-10 DIAGNOSIS — Z7901 Long term (current) use of anticoagulants: Secondary | ICD-10-CM

## 2012-05-10 DIAGNOSIS — S0003XA Contusion of scalp, initial encounter: Secondary | ICD-10-CM | POA: Insufficient documentation

## 2012-05-10 DIAGNOSIS — E785 Hyperlipidemia, unspecified: Secondary | ICD-10-CM | POA: Insufficient documentation

## 2012-05-10 DIAGNOSIS — Z8679 Personal history of other diseases of the circulatory system: Secondary | ICD-10-CM | POA: Insufficient documentation

## 2012-05-10 DIAGNOSIS — W1809XA Striking against other object with subsequent fall, initial encounter: Secondary | ICD-10-CM | POA: Insufficient documentation

## 2012-05-10 DIAGNOSIS — W19XXXA Unspecified fall, initial encounter: Secondary | ICD-10-CM

## 2012-05-10 DIAGNOSIS — Y939 Activity, unspecified: Secondary | ICD-10-CM | POA: Insufficient documentation

## 2012-05-10 DIAGNOSIS — Z87442 Personal history of urinary calculi: Secondary | ICD-10-CM | POA: Insufficient documentation

## 2012-05-10 DIAGNOSIS — W010XXA Fall on same level from slipping, tripping and stumbling without subsequent striking against object, initial encounter: Secondary | ICD-10-CM | POA: Insufficient documentation

## 2012-05-10 DIAGNOSIS — K219 Gastro-esophageal reflux disease without esophagitis: Secondary | ICD-10-CM | POA: Insufficient documentation

## 2012-05-10 DIAGNOSIS — R791 Abnormal coagulation profile: Secondary | ICD-10-CM | POA: Insufficient documentation

## 2012-05-10 NOTE — ED Notes (Signed)
Pt reports slipped on ice in his driveway and fell hitting the back of his head.  Pt has abrasion to back of head.  Denies any loss of consciousness or n/v.  Reports is on coumadin.

## 2012-05-10 NOTE — ED Provider Notes (Signed)
History    This chart was scribed for [employer]. Alvino Chapel, MD by Marin Comment, ED Scribe. The patient was seen in room APA14/AP[facility]14. Patient's care was started at 0803.   CSN: [id number]  Arrival date & time 05/10/12  3474   First MD Initiated Contact with Patient 05/10/12 [phone]      Chief Complaint  Patient presents with  . Fall    The history is provided by the patient. No language interpreter was used.   Daniel Morse is a 72 y.o. male who presents to the Emergency Department complaining of an abrasion to his occipital head with associated soreness after a fall this morning. He reports that he slipped and fell in his driveway on some ice this morning, falling backwards and hitting his head. He denies any LOC. He denies any other injuries or complaints of pain. Wife reports he is behaving normally and is at baseline. He is on Coumadin and reports his INR has been 2.2-2.6 and is well controlled.    Past Medical History  Diagnosis Date  . Hyperlipidemia     Metabolic syndrome with increased triglycerides  . Hypertension   . GERD (gastroesophageal reflux disease)     h/o esophagitis  . Atrial flutter 06/2010  . Nephrolithiasis   . Chest pain     negative stress nuclear in 11/2002  . Macular degeneration     legally blind  . Tobacco abuse     50 pack years; 1/2 pack per day  . Cerebrovascular disease 2008    Left CEA  . Chronic anticoagulation   . History of colonic polyps     Colonoscopy and polypectomy in 03/2011    Past Surgical History  Procedure Laterality Date  . Cystoscopy w/ ureteral stent placement      and stone extraction  . Carotid endarterectomy  2008    Left  . Colonoscopy  X 2    4 small polyps ablated  . Colonoscopy  04/04/2011    Procedure: COLONOSCOPY;  Surgeon: Rogene Houston, MD;  Location: [facility]; Multiple polyps ablated    Family History  Problem Relation Age of Onset  . Colon cancer Paternal Uncle     History  Substance Use Topics   . Smoking status: Former Smoker -- 0.50 packs/day for 50 years    Types: Cigarettes  . Smokeless tobacco: Former Systems developer    Quit date: 06/01/2010  . Alcohol Use: Yes     Comment: occasional      Review of Systems  Skin: Positive for wound.  All other systems reviewed and are negative.    Allergies  Simvastatin  Home Medications   Current Outpatient Rx  Name  Route  Sig  Dispense  Refill  . colesevelam (WELCHOL) 625 MG tablet   Oral   Take 2 tablets (1,250 mg total) by mouth 3 (three) times daily.   540 tablet   1   . digoxin (LANOXIN) 0.25 MG tablet   Oral   Take 1 tablet (250 mcg total) by mouth daily.   90 tablet   3   . ezetimibe (ZETIA) 10 MG tablet   Oral   Take 1 tablet (10 mg total) by mouth daily.   30 tablet   12   . fenofibrate 160 MG tablet   Oral   Take 1 tablet (160 mg total) by mouth daily.   90 tablet   3   . furosemide (LASIX) 40 MG tablet   Oral  Take 1 tablet (40 mg total) by mouth daily.   90 tablet   3   . metoprolol tartrate (LOPRESSOR) 25 MG tablet   Oral   Take 1 tablet (25 mg total) by mouth 3 (three) times daily.   270 tablet   0     Three times a day is the correct dose change 1545  ...   . Multiple Vitamins-Minerals (EYE-VITES PO)   Oral   Take 1 tablet by mouth daily.           . Omega-3 Fatty Acids (FISH OIL) 1000 MG CAPS   Oral   Take 1 capsule by mouth 3 (three) times daily.          Marland Kitchen omeprazole (PRILOSEC) 20 MG capsule   Oral   Take 20 mg by mouth every other day.         . warfarin (COUMADIN) 2.5 MG tablet   Oral   Take 1 tablet (2.5 mg total) by mouth daily. 2.5 mg all days except 3.75 mg on Monday and Friday   135 tablet   3     BP 116/77  Pulse 81  Temp(Src) 97.7 F (36.5 C) (Oral)  Resp 20  Ht 5\' 7"  (1.702 m)  Wt 224 lb (101.606 kg)  BMI 35.08 kg/m2  SpO2 95%  Physical Exam  Nursing note and vitals reviewed. Constitutional: He is oriented to person, place, and time. He appears  well-developed and well-nourished. No distress.  HENT:  Head: Normocephalic.  3 cm abrasion to the occipital scalp. Mild hematoma noted. No step off or deformity.   Eyes: EOM are normal. Pupils are equal, round, and reactive to light.  Neck: Normal range of motion. Neck supple. No tracheal deviation present.  Cardiovascular: Normal rate and normal heart sounds.  An irregular rhythm present.  Pulmonary/Chest: Effort normal and breath sounds normal. No respiratory distress.  Abdominal: Soft. He exhibits no distension.  Musculoskeletal: Normal range of motion. He exhibits no edema.  Neurological: He is alert and oriented to person, place, and time.  Skin: Skin is warm and dry.  Psychiatric: He has a normal mood and affect. His behavior is normal.    ED Course  Procedures (including critical care time)  DIAGNOSTIC STUDIES: Oxygen Saturation is 93% on room air, adequate by my interpretation.    COORDINATION OF CARE:  08:10-Discussed planned course of treatment with the patient including a head CT, who is agreeable at this time.   Labs Reviewed - No data to display Ct Head Wo Contrast  05/10/2012  *RADIOLOGY REPORT*  Clinical Data: 07/10/2012 and hit head.  On Coumadin  CT HEAD WITHOUT CONTRAST  Technique:  Contiguous axial images were obtained from the base of the skull through the vertex without contrast.  Comparison: None  Findings: Generalized atrophy.  Chronic microvascular ischemia in the white matter.  Negative for acute infarct.  Negative for acute hemorrhage or mass.  Mild scalp hematoma posteriorly on the left.  Negative for skull fracture.  Sinusitis with frothy secretions layering in the sphenoid sinus.  IMPRESSION: No acute intracranial abnormality.  Sinusitis.   Original Report Authenticated By: Larey Seat, M.D.      1. Fall, initial encounter   2. Chronic anticoagulation   3. Scalp hematoma, initial encounter       MDM  Patient with fall on Coumadin. Negative CT. Patient's  INR has been stable recently. He is no neurologic deficits. He'll be discharged home and will followup as needed.  He was given instructions on what to watch for for delayed bleed.   I personally performed the services described in this documentation, which was scribed in my presence. The recorded information has been reviewed and is accurate.        Jasper Riling. Alvino Chapel, MD 05/10/12 1527

## 2012-05-11 ENCOUNTER — Ambulatory Visit (INDEPENDENT_AMBULATORY_CARE_PROVIDER_SITE_OTHER): Payer: Medicare Other | Admitting: *Deleted

## 2012-05-11 DIAGNOSIS — Z7901 Long term (current) use of anticoagulants: Secondary | ICD-10-CM

## 2012-05-11 DIAGNOSIS — IMO0002 Reserved for concepts with insufficient information to code with codable children: Secondary | ICD-10-CM

## 2012-05-11 DIAGNOSIS — I4891 Unspecified atrial fibrillation: Secondary | ICD-10-CM

## 2012-05-11 DIAGNOSIS — G450 Vertebro-basilar artery syndrome: Secondary | ICD-10-CM

## 2012-05-11 DIAGNOSIS — I679 Cerebrovascular disease, unspecified: Secondary | ICD-10-CM

## 2012-05-15 ENCOUNTER — Encounter: Payer: Self-pay | Admitting: Cardiology

## 2012-05-18 ENCOUNTER — Telehealth: Payer: Self-pay | Admitting: Cardiology

## 2012-05-18 NOTE — Telephone Encounter (Signed)
Needs refill on Lasix sent in to [facility]. / tgs

## 2012-05-19 ENCOUNTER — Other Ambulatory Visit: Payer: Self-pay | Admitting: *Deleted

## 2012-05-19 MED ORDER — FUROSEMIDE 40 MG PO TABS: 40.0000 mg | ORAL_TABLET | Freq: Every day | ORAL | Status: DC

## 2012-05-19 NOTE — Telephone Encounter (Signed)
Sent for 90 day supply with 3 refills

## 2012-05-20 ENCOUNTER — Other Ambulatory Visit: Payer: Self-pay | Admitting: *Deleted

## 2012-05-21 ENCOUNTER — Ambulatory Visit (INDEPENDENT_AMBULATORY_CARE_PROVIDER_SITE_OTHER): Payer: Medicare Other | Admitting: Cardiology

## 2012-05-21 ENCOUNTER — Encounter: Payer: Self-pay | Admitting: Cardiology

## 2012-05-21 VITALS — BP 121/91 | HR 71 | Ht 66.5 in | Wt 219.2 lb

## 2012-05-21 DIAGNOSIS — E785 Hyperlipidemia, unspecified: Secondary | ICD-10-CM

## 2012-05-21 DIAGNOSIS — I4891 Unspecified atrial fibrillation: Secondary | ICD-10-CM

## 2012-05-21 DIAGNOSIS — I10 Essential (primary) hypertension: Secondary | ICD-10-CM

## 2012-05-21 DIAGNOSIS — I679 Cerebrovascular disease, unspecified: Secondary | ICD-10-CM

## 2012-05-21 DIAGNOSIS — Z7901 Long term (current) use of anticoagulants: Secondary | ICD-10-CM

## 2012-05-21 DIAGNOSIS — R0989 Other specified symptoms and signs involving the circulatory and respiratory systems: Secondary | ICD-10-CM

## 2012-05-21 MED ORDER — CHLORTHALIDONE 25 MG PO TABS: 12.5000 mg | ORAL_TABLET | Freq: Every day | ORAL | Status: DC

## 2012-05-21 NOTE — Progress Notes (Deleted)
Name: Daniel Morse    DOB: Jun 16, 1940  Age: 72 y.o.  MR#: [id number]       PCP:  Colette Ribas, MD      Insurance: Payor: [employer] [place] [employer]  Plan: BLUE MEDICARE  Product Type: *No Product type*    CC:   No chief complaint on file.  STOOLS 4/22 negative Med List  VS Filed Vitals:   05/21/12 1303  BP: 121/91  Pulse: 71  Height: 5' 6.5" (1.689 m)  Weight: 219 lb 4 oz (99.451 kg)    Weights Current Weight  05/21/12 219 lb 4 oz (99.451 kg)  05/10/12 224 lb (101.606 kg)  06/17/11 210 lb (95.255 kg)    Blood Pressure  BP Readings from Last 3 Encounters:  05/21/12 121/91  05/10/12 116/77  06/17/11 122/87     Admit date:  (Not on file) Last encounter with RMR:  05/18/2012   Allergy Simvastatin  Current Outpatient Prescriptions  Medication Sig Dispense Refill  . colesevelam (WELCHOL) 625 MG tablet Take 2 tablets (1,250 mg total) by mouth 3 (three) times daily.  540 tablet  1  . digoxin (LANOXIN) 0.25 MG tablet Take 1 tablet (250 mcg total) by mouth daily.  90 tablet  3  . ezetimibe (ZETIA) 10 MG tablet Take 1 tablet (10 mg total) by mouth daily.  30 tablet  12  . fenofibrate 160 MG tablet Take 1 tablet (160 mg total) by mouth daily.  90 tablet  3  . furosemide (LASIX) 40 MG tablet Take 1 tablet (40 mg total) by mouth daily.  90 tablet  3  . metoprolol tartrate (LOPRESSOR) 25 MG tablet Take 1 tablet (25 mg total) by mouth 3 (three) times daily.  270 tablet  0  . Multiple Vitamins-Minerals (EYE-VITES PO) Take 1 tablet by mouth daily.        . Omega-3 Fatty Acids (FISH OIL) 1000 MG CAPS Take 1 capsule by mouth 3 (three) times daily.       Marland Kitchen omeprazole (PRILOSEC) 20 MG capsule Take 20 mg by mouth every other day.      . pravastatin (PRAVACHOL) 40 MG tablet Take 40 mg by mouth daily.      Marland Kitchen warfarin (COUMADIN) 2.5 MG tablet Take 1 tablet (2.5 mg total) by mouth daily. 2.5 mg all days except 3.75 mg on Monday and Friday  135 tablet  3   No current  facility-administered medications for this visit.    Discontinued Meds:   There are no discontinued medications.  Patient Active Problem List  Diagnosis  . Hyperlipidemia  . Hypertension  . GERD (gastroesophageal reflux disease)  . Atrial fibrillation or flutter  . Chronic anticoagulation  . Tobacco abuse  . Cerebrovascular disease  . Nephrolithiasis    LABS    Component Value Date/Time   NA 139 01/09/2011 1135   NA 142 09/19/2010 1449   NA 143 08/14/2010 1005   K 3.8 01/09/2011 1135   K 3.9 09/19/2010 1449   K 4.0 08/14/2010 1005   CL 102 01/09/2011 1135   CL 105 09/19/2010 1449   CL 107 08/14/2010 1005   CO2 25 01/09/2011 1135   CO2 24 09/19/2010 1449   CO2 29 08/14/2010 1005   GLUCOSE 120* 01/09/2011 1135   GLUCOSE 74 09/19/2010 1449   GLUCOSE 75 08/14/2010 1005   BUN 19 01/09/2011 1135   BUN 19 09/19/2010 1449   BUN 18 08/14/2010 1005   CREATININE 1.23 01/09/2011  1135   CREATININE 1.16 09/19/2010 1449   CREATININE 1.15 08/14/2010 1005   CREATININE 1.20 06/23/2010 0201   CREATININE 1.15 06/22/2010 1209   CALCIUM 9.1 01/09/2011 1135   CALCIUM 8.8 09/19/2010 1449   CALCIUM 9.2 08/14/2010 1005   GFRNONAA >60 06/23/2010 0201   GFRNONAA >60 06/22/2010 1209   GFRAA  Value: >60        The eGFR has been calculated using the MDRD equation. This calculation has not been validated in all clinical situations. eGFR's persistently <60 mL/min signify possible Chronic Kidney Disease. 06/23/2010 0201   GFRAA  Value: >60        The eGFR has been calculated using the MDRD equation. This calculation has not been validated in all clinical situations. eGFR's persistently <60 mL/min signify possible Chronic Kidney Disease. 06/22/2010 1209   CMP     Component Value Date/Time   NA 139 01/09/2011 1135   K 3.8 01/09/2011 1135   CL 102 01/09/2011 1135   CO2 25 01/09/2011 1135   GLUCOSE 120* 01/09/2011 1135   BUN 19 01/09/2011 1135   CREATININE 1.23 01/09/2011 1135   CREATININE 1.20 06/23/2010 0201   CALCIUM 9.1  01/09/2011 1135   PROT 6.2 08/14/2010 1005   ALBUMIN 4.3 08/14/2010 1005   AST 23 08/14/2010 1005   ALT 25 08/14/2010 1005   ALKPHOS 49 08/14/2010 1005   BILITOT 0.5 08/14/2010 1005   GFRNONAA >60 06/23/2010 0201   GFRAA  Value: >60        The eGFR has been calculated using the MDRD equation. This calculation has not been validated in all clinical situations. eGFR's persistently <60 mL/min signify possible Chronic Kidney Disease. 06/23/2010 0201       Component Value Date/Time   WBC 8.7 08/14/2010 1005   WBC 9.6 06/23/2010 0201   WBC 9.0 06/22/2010 1209   HGB 14.3 08/14/2010 1005   HGB 13.1 06/23/2010 0201   HGB 14.1 06/22/2010 1209   HCT 42.5 08/14/2010 1005   HCT 38.1* 06/23/2010 0201   HCT 40.6 06/22/2010 1209   MCV 95.9 08/14/2010 1005   MCV 94.1 06/23/2010 0201   MCV 93.1 06/22/2010 1209    Lipid Panel     Component Value Date/Time   CHOL  Value: 207        ATP III CLASSIFICATION:  <200     mg/dL   Desirable  200-239  mg/dL   Borderline High  >=240    mg/dL   High       * 06/23/2010 0201   TRIG 261* 06/23/2010 0201   HDL 26* 06/23/2010 0201   CHOLHDL 8.0 06/23/2010 0201   VLDL 52* 06/23/2010 0201   LDLCALC  Value: 129        Total Cholesterol/HDL:CHD Risk Coronary Heart Disease Risk Table                     Men   Women  1/2 Average Risk   3.4   3.3  Average Risk       5.0   4.4  2 X Average Risk   9.6   7.1  3 X Average Risk  23.4   11.0        Use the calculated Patient Ratio above and the CHD Risk Table to determine the patient's CHD Risk.        ATP III CLASSIFICATION (LDL):  <100     mg/dL   Optimal  100-129  mg/dL  Near or Above                    Optimal  130-159  mg/dL   Borderline  160-189  mg/dL   High  >190     mg/dL   Very High* 06/23/2010 0201    ABG No results found for this basename: phart, pco2, pco2art, po2, po2art, hco3, tco2, acidbasedef, o2sat     Lab Results  Component Value Date   TSH 4.807* 08/14/2010   BNP (last 3 results) No results found for this basename: PROBNP,   in the last 8760 hours Cardiac Panel (last 3 results) No results found for this basename: CKTOTAL, CKMB, TROPONINI, RELINDX,  in the last 72 hours  Iron/TIBC/Ferritin No results found for this basename: iron, tibc, ferritin     EKG Orders placed in visit on 05/21/12  . EKG 12-LEAD     Prior Assessment and Plan Problem List as of 05/21/2012     ICD-9-CM     Cardiology Problems   Hyperlipidemia   Last Assessment & Plan   06/17/2011 Office Visit Edited 06/23/2011  5:30 PM by Yehuda Savannah, MD     Inadequate control of hyperlipidemia, in the setting of significant cerebrovascular disease.  Patient has been statin intolerant.  Will add WelChol and ezetimibe.  Repeat Lipid profile in one month.  He will likely require even more medical therapy.    Hypertension   Last Assessment & Plan   06/17/2011 Office Visit Written 06/23/2011  5:30 PM by Yehuda Savannah, MD     Blood pressure has been well controlled over the past 10 months.  Current medications will be continued.    Atrial fibrillation or flutter   Last Assessment & Plan   06/17/2011 Office Visit Written 06/17/2011 12:26 PM by Yehuda Savannah, MD     No symptoms attributable to atrial arrhythmias.  Current strategy of rate control and anticoagulation will be continued.    Cerebrovascular disease   Last Assessment & Plan   06/17/2011 Office Visit Edited 06/23/2011  5:28 PM by Yehuda Savannah, MD     Patient is doing well following left carotid endarterectomy.  Continued surveillance with periodic carotid ultrasound studies will be appropriate.      Other   GERD (gastroesophageal reflux disease)   Chronic anticoagulation   Last Assessment & Plan   06/17/2011 Office Visit Written 06/17/2011 12:44 PM by Yehuda Savannah, MD     Recent CBC apparently normal.  Repeat stool Hemoccult studies will be performed to exclude occult GI blood loss.    Tobacco abuse   Last Assessment & Plan   08/20/2010 Office Visit Written 08/20/2010   5:05 PM by Lendon Colonel, NP     Cessation is recommended.    Nephrolithiasis       Imaging: Ct Head Wo Contrast  05/10/2012  *RADIOLOGY REPORT*  Clinical Data: [place] and hit head.  On Coumadin  CT HEAD WITHOUT CONTRAST  Technique:  Contiguous axial images were obtained from the base of the skull through the vertex without contrast.  Comparison: None  Findings: Generalized atrophy.  Chronic microvascular ischemia in the white matter.  Negative for acute infarct.  Negative for acute hemorrhage or mass.  Mild scalp hematoma posteriorly on the left.  Negative for skull fracture.  Sinusitis with frothy secretions layering in the sphenoid sinus.  IMPRESSION: No acute intracranial abnormality.  Sinusitis.   Original Report Authenticated By: Carl Best, M.D.

## 2012-05-21 NOTE — Assessment & Plan Note (Addendum)
Patient has had moderate to severe hyperlipidemia in the past, and effective therapy has not been possible due to statin intolerance. He is advised to decrease the dose of pravastatin 20 mg per day if he cannot tolerate 40 and return for a lipid profile in one month.

## 2012-05-21 NOTE — Assessment & Plan Note (Addendum)
Patient's vascular surgeon recommended annual carotid ultrasound examinations. Most recent study last year showed mild to moderate atherosclerosis without focal stenosis. A repeat test will be obtained.

## 2012-05-21 NOTE — Assessment & Plan Note (Signed)
Continues to do fine with respect to his arrhythmia. No symptoms, and has tolerated anticoagulation well.

## 2012-05-21 NOTE — Assessment & Plan Note (Signed)
No evidence for blood loss or other complications of anticoagulation. No adverse effects of recent head trauma. Current medication will be continued with reassessment of CBC and FOBT.

## 2012-05-21 NOTE — Assessment & Plan Note (Signed)
Blood pressure control has been excellent for at least the past 2 years. He does not appear to require a loop diuretic. Furosemide will be discontinued and chlorthalidone started at a dose of 12.5 mg per day with monitoring of electrolytes and renal function.

## 2012-05-21 NOTE — Progress Notes (Signed)
Patient ID: Daniel Morse, male   DOB: 1940/04/23, 72 y.o.   MRN: [id number]  HPI: Schedule return visit for very pleasant gentleman with cerebrovascular disease, multiple vascular risk factors and atrial fibrillation.  He describes class II dyspnea on exertion, which is chronic. He quit smoking 2 years ago resulting in approximately a 15 pound weight gain. Blood pressure control has been good. Hyperlipidemia has been problematic, as he has experienced adverse effects to most statins. He is currently tolerating pravastatin 40 mg per day.  He has chronically complained of dry mouth, but appears to have no decrease in tear production. There is no history of salivary disorders. He dates the onset of xerostomia to the initiation of anti-hypertensive treatment.  He slipped on the ice a few weeks ago and was evaluated in the emergency department after suffering head trauma with an abrasion. There was no evidence for intracranial bleed.  Current Outpatient Prescriptions  Medication Sig Dispense Refill  . digoxin (LANOXIN) 0.25 MG tablet Take 1 tablet (250 mcg total) by mouth daily.  90 tablet  3  . fenofibrate 160 MG tablet Take 1 tablet (160 mg total) by mouth daily.  90 tablet  3  . furosemide (LASIX) 40 MG tablet Take 1 tablet (40 mg total) by mouth daily.  90 tablet  3  . metoprolol tartrate (LOPRESSOR) 25 MG tablet Take 1 tablet (25 mg total) by mouth 3 (three) times daily.  270 tablet  0  . Multiple Vitamins-Minerals (EYE-VITES PO) Take 1 tablet by mouth daily.        . Omega-3 Fatty Acids (FISH OIL) 1000 MG CAPS Take 1 capsule by mouth 3 (three) times daily.       Marland Kitchen omeprazole (PRILOSEC) 20 MG capsule Take 20 mg by mouth every other day.      . pravastatin (PRAVACHOL) 40 MG tablet Take 40 mg by mouth daily.      Marland Kitchen warfarin (COUMADIN) 2.5 MG tablet Take 1 tablet (2.5 mg total) by mouth daily. 2.5 mg all days except 3.75 mg on Monday and Friday  135 tablet  3   No current facility-administered  medications for this visit.   Allergies  Allergen Reactions  . Simvastatin Other (See Comments)    Severe myalgias     Past medical history, social history, and family history reviewed and updated.  ROS: Denies chest discomfort, orthopnea, PND, pedal edema, palpitations, lightheadedness or syncope. No apparent blood loss. All other systems reviewed and are negative.  PHYSICAL EXAM: BP 121/91  Pulse 71  Ht 5' 6.5" (1.689 m)  Wt 99.451 kg (219 lb 4 oz)  BMI 34.86 kg/m2;  Body mass index is 34.86 kg/(m^2). General-Well developed; no acute distress Body habitus-moderately overweight Neck-No JVD; no carotid bruits; left carotid endarterectomy scar Lungs-clear lung fields; resonant to percussion Cardiovascular- PMI not palpable; distant S1 and S2; irregular rhythm Abdomen-normal bowel sounds; soft and non-tender without masses or organomegaly Musculoskeletal-No deformities, no cyanosis or clubbing Neurologic-Normal cranial nerves; symmetric strength and tone Skin-Warm, no significant lesions Extremities-distal pulses intact; no edema  EKG: Atrial fibrillation with a controlled ventricular response and a ventricular rate of 84 bpm; left axis deviation; delayed R-wave progression-possible previous anteroseptal MI; nonspecific T wave abnormality; low-voltage. No previous tracing for comparison.  Jacqulyn Ducking, MD 05/21/2012  1:39 PM  ASSESSMENT AND PLAN

## 2012-05-21 NOTE — Patient Instructions (Addendum)
Your physician recommends that you schedule a follow-up appointment in: 3 months  Your physician recommends that you return for lab work in: 1 month  Your physician has recommended you make the following change in your medication:  1 - START Chlorthalidone 12.5 mg daily  Your physician has requested that you have a carotid duplex. This test is an ultrasound of the carotid arteries in your neck. It looks at blood flow through these arteries that supply the brain with blood. Allow one hour for this exam. There are no restrictions or special instructions.  If you have any muscle cramping on 40 mg of pravastatin, you may decrease dose to 20 mg

## 2012-05-26 ENCOUNTER — Ambulatory Visit (HOSPITAL_COMMUNITY): Payer: Medicare Other

## 2012-05-28 ENCOUNTER — Encounter: Payer: Self-pay | Admitting: *Deleted

## 2012-05-29 ENCOUNTER — Encounter: Payer: Self-pay | Admitting: Cardiology

## 2012-05-29 ENCOUNTER — Ambulatory Visit (HOSPITAL_COMMUNITY)
Admission: RE | Admit: 2012-05-29 | Discharge: 2012-05-29 | Disposition: A | Discharge status: Home / Self Care | Payer: Medicare Other | Source: Ambulatory Visit | Attending: Cardiology | Admitting: Cardiology

## 2012-05-29 DIAGNOSIS — I4891 Unspecified atrial fibrillation: Secondary | ICD-10-CM

## 2012-05-29 DIAGNOSIS — R0989 Other specified symptoms and signs involving the circulatory and respiratory systems: Secondary | ICD-10-CM

## 2012-06-15 ENCOUNTER — Ambulatory Visit (INDEPENDENT_AMBULATORY_CARE_PROVIDER_SITE_OTHER): Payer: Medicare Other | Admitting: *Deleted

## 2012-06-15 ENCOUNTER — Ambulatory Visit: Payer: Medicare Other | Admitting: Cardiology

## 2012-06-15 DIAGNOSIS — IMO0002 Reserved for concepts with insufficient information to code with codable children: Secondary | ICD-10-CM

## 2012-06-15 DIAGNOSIS — I679 Cerebrovascular disease, unspecified: Secondary | ICD-10-CM

## 2012-06-15 DIAGNOSIS — I4891 Unspecified atrial fibrillation: Secondary | ICD-10-CM

## 2012-06-15 DIAGNOSIS — G450 Vertebro-basilar artery syndrome: Secondary | ICD-10-CM

## 2012-06-15 DIAGNOSIS — Z7901 Long term (current) use of anticoagulants: Secondary | ICD-10-CM

## 2012-06-17 ENCOUNTER — Other Ambulatory Visit: Payer: Self-pay | Admitting: *Deleted

## 2012-06-17 DIAGNOSIS — I10 Essential (primary) hypertension: Secondary | ICD-10-CM

## 2012-06-17 DIAGNOSIS — I4891 Unspecified atrial fibrillation: Secondary | ICD-10-CM

## 2012-06-17 DIAGNOSIS — R0989 Other specified symptoms and signs involving the circulatory and respiratory systems: Secondary | ICD-10-CM

## 2012-06-22 LAB — COMPREHENSIVE METABOLIC PANEL
Alkaline Phosphatase: 55 U/L (ref 39–117)
BUN: 21 mg/dL (ref 6–23)
Calcium: 9.5 mg/dL (ref 8.4–10.5)
Creat: 1.32 mg/dL (ref 0.50–1.35)
Glucose, Bld: 128 mg/dL — ABNORMAL HIGH (ref 70–99)
Potassium: 3.9 mEq/L (ref 3.5–5.3)

## 2012-06-22 LAB — LIPID PANEL
Cholesterol: 226 mg/dL — ABNORMAL HIGH (ref 0–200)
HDL: 31 mg/dL — ABNORMAL LOW (ref >39)
Total CHOL/HDL Ratio: 7.3 Ratio
Triglycerides: 434 mg/dL — ABNORMAL HIGH (ref <150)

## 2012-06-23 ENCOUNTER — Encounter: Payer: Self-pay | Admitting: Cardiology

## 2012-06-25 ENCOUNTER — Encounter: Payer: Self-pay | Admitting: *Deleted

## 2012-06-25 ENCOUNTER — Other Ambulatory Visit: Payer: Self-pay | Admitting: *Deleted

## 2012-06-25 DIAGNOSIS — I10 Essential (primary) hypertension: Secondary | ICD-10-CM

## 2012-06-25 DIAGNOSIS — E782 Mixed hyperlipidemia: Secondary | ICD-10-CM

## 2012-06-25 MED ORDER — OMEGA-3-ACID ETHYL ESTERS 1 G PO CAPS: 2.0000 g | ORAL_CAPSULE | Freq: Two times a day (BID) | ORAL | Status: DC

## 2012-07-13 ENCOUNTER — Ambulatory Visit (INDEPENDENT_AMBULATORY_CARE_PROVIDER_SITE_OTHER): Payer: Medicare Other | Admitting: *Deleted

## 2012-07-13 DIAGNOSIS — G450 Vertebro-basilar artery syndrome: Secondary | ICD-10-CM

## 2012-07-13 DIAGNOSIS — Z7901 Long term (current) use of anticoagulants: Secondary | ICD-10-CM

## 2012-07-13 DIAGNOSIS — I679 Cerebrovascular disease, unspecified: Secondary | ICD-10-CM

## 2012-07-13 DIAGNOSIS — I4891 Unspecified atrial fibrillation: Secondary | ICD-10-CM

## 2012-07-13 DIAGNOSIS — IMO0002 Reserved for concepts with insufficient information to code with codable children: Secondary | ICD-10-CM

## 2012-07-13 LAB — POCT INR: INR: 2.5

## 2012-07-24 ENCOUNTER — Telehealth: Payer: Self-pay | Admitting: *Deleted

## 2012-07-24 MED ORDER — METOPROLOL TARTRATE 25 MG PO TABS: 25.0000 mg | ORAL_TABLET | Freq: Three times a day (TID) | ORAL | Status: DC

## 2012-07-24 MED ORDER — DIGOXIN 250 MCG PO TABS: 250.0000 ug | ORAL_TABLET | Freq: Every day | ORAL | Status: DC

## 2012-07-24 NOTE — Telephone Encounter (Signed)
Incoming fax of patient medication refill request noted per patient pharmacy. rx sent to pharmacy by e-script [facility] for metoprolol and dig

## 2012-07-28 ENCOUNTER — Other Ambulatory Visit: Payer: Self-pay | Admitting: Cardiology

## 2012-07-28 LAB — LIPID PANEL
HDL: 30 mg/dL — ABNORMAL LOW (ref >39)
LDL Cholesterol: 119 mg/dL — ABNORMAL HIGH (ref 0–99)
Total CHOL/HDL Ratio: 7.2 Ratio

## 2012-07-28 LAB — HEPATIC FUNCTION PANEL
AST: 27 U/L (ref 0–37)
Albumin: 4 g/dL (ref 3.5–5.2)
Bilirubin, Direct: 0.2 mg/dL (ref 0.0–0.3)
Total Bilirubin: 0.9 mg/dL (ref 0.3–1.2)

## 2012-07-29 ENCOUNTER — Encounter: Payer: Self-pay | Admitting: *Deleted

## 2012-07-29 ENCOUNTER — Encounter: Payer: Self-pay | Admitting: Cardiology

## 2012-08-04 ENCOUNTER — Telehealth: Payer: Self-pay | Admitting: *Deleted

## 2012-08-04 MED ORDER — DIGOXIN 250 MCG PO TABS: 250.0000 ug | ORAL_TABLET | Freq: Every day | ORAL | Status: DC

## 2012-08-04 NOTE — Telephone Encounter (Signed)
Received incoming call from [facility] from [facility] and she advised the pt medication has been approved until 08-03-13, the pt has been made aware, re-sent RX back through to pt pharmacy for digoxin

## 2012-08-21 ENCOUNTER — Ambulatory Visit: Payer: Medicare Other | Admitting: Cardiology

## 2012-08-24 ENCOUNTER — Ambulatory Visit (INDEPENDENT_AMBULATORY_CARE_PROVIDER_SITE_OTHER): Payer: Medicare Other | Admitting: *Deleted

## 2012-08-24 DIAGNOSIS — Z7901 Long term (current) use of anticoagulants: Secondary | ICD-10-CM

## 2012-08-24 DIAGNOSIS — IMO0002 Reserved for concepts with insufficient information to code with codable children: Secondary | ICD-10-CM

## 2012-08-24 DIAGNOSIS — I4891 Unspecified atrial fibrillation: Secondary | ICD-10-CM

## 2012-08-24 DIAGNOSIS — I679 Cerebrovascular disease, unspecified: Secondary | ICD-10-CM

## 2012-08-24 DIAGNOSIS — G450 Vertebro-basilar artery syndrome: Secondary | ICD-10-CM

## 2012-08-24 LAB — POCT INR: INR: 2.5

## 2012-08-25 ENCOUNTER — Ambulatory Visit: Payer: Medicare Other | Admitting: Cardiology

## 2012-10-28 ENCOUNTER — Encounter: Payer: Self-pay | Admitting: Cardiology

## 2012-10-28 ENCOUNTER — Ambulatory Visit (INDEPENDENT_AMBULATORY_CARE_PROVIDER_SITE_OTHER): Payer: Medicare Other | Admitting: Cardiology

## 2012-10-28 ENCOUNTER — Ambulatory Visit (INDEPENDENT_AMBULATORY_CARE_PROVIDER_SITE_OTHER): Payer: Medicare Other | Admitting: *Deleted

## 2012-10-28 VITALS — BP 116/76 | HR 75 | Ht 66.5 in | Wt 203.0 lb

## 2012-10-28 DIAGNOSIS — I679 Cerebrovascular disease, unspecified: Secondary | ICD-10-CM

## 2012-10-28 DIAGNOSIS — I4891 Unspecified atrial fibrillation: Secondary | ICD-10-CM

## 2012-10-28 DIAGNOSIS — G450 Vertebro-basilar artery syndrome: Secondary | ICD-10-CM

## 2012-10-28 DIAGNOSIS — I739 Peripheral vascular disease, unspecified: Secondary | ICD-10-CM

## 2012-10-28 DIAGNOSIS — Z7901 Long term (current) use of anticoagulants: Secondary | ICD-10-CM

## 2012-10-28 DIAGNOSIS — I10 Essential (primary) hypertension: Secondary | ICD-10-CM

## 2012-10-28 DIAGNOSIS — IMO0002 Reserved for concepts with insufficient information to code with codable children: Secondary | ICD-10-CM

## 2012-10-28 MED ORDER — ATENOLOL-CHLORTHALIDONE 100-25 MG PO TABS: 1.0000 | ORAL_TABLET | Freq: Every day | ORAL | Status: DC

## 2012-10-28 NOTE — Progress Notes (Deleted)
Name: Daniel Morse    DOB: 11-28-1940  Age: 72 y.o.  MR#: [id number]       PCP:  Purvis Kilts, MD      Insurance: Payor: BLUE [place] / Plan: BLUE MEDICARE / Product Type: *No Product type* /   CC:   No chief complaint on file. bottles   VS Filed Vitals:   10/28/12 1256  BP: 116/76  Pulse: 75  Height: 5' 6.5" (1.689 m)  Weight: 203 lb (92.08 kg)    Weights Current Weight  10/28/12 203 lb (92.08 kg)  05/21/12 219 lb 4 oz (99.451 kg)  05/10/12 224 lb (101.606 kg)    Blood Pressure  BP Readings from Last 3 Encounters:  10/28/12 116/76  05/21/12 121/91  05/10/12 116/77     Admit date:  (Not on file) Last encounter with RMR:  05/21/2012   Allergy Simvastatin  Current Outpatient Prescriptions  Medication Sig Dispense Refill  . chlorthalidone (HYGROTON) 25 MG tablet Take 0.5 tablets (12.5 mg total) by mouth daily.  45 tablet  3  . digoxin (LANOXIN) 0.25 MG tablet Take 1 tablet (250 mcg total) by mouth daily.  90 tablet  3  . fenofibrate 160 MG tablet Take 1 tablet (160 mg total) by mouth daily.  90 tablet  3  . metoprolol tartrate (LOPRESSOR) 25 MG tablet Take 1 tablet (25 mg total) by mouth 3 (three) times daily.  270 tablet  0  . Multiple Vitamins-Minerals (EYE-VITES PO) Take 1 tablet by mouth daily.        Marland Kitchen omeprazole (PRILOSEC) 20 MG capsule Take 20 mg by mouth every other day.      . warfarin (COUMADIN) 2.5 MG tablet Take 1 tablet (2.5 mg total) by mouth daily. 2.5 mg all days except 3.75 mg on Monday and Friday  135 tablet  3   No current facility-administered medications for this visit.    Discontinued Meds:    Medications Discontinued During This Encounter  Medication Reason  . Omega-3 Fatty Acids (FISH OIL) 1000 MG CAPS Error  . pravastatin (PRAVACHOL) 40 MG tablet Error  . omega-3 acid ethyl esters (LOVAZA) 1 G capsule Error  . furosemide (LASIX) 40 MG tablet Error    Patient Active Problem List   Diagnosis Date Noted   . Chronic anticoagulation   . Tobacco abuse   . Cerebrovascular disease   . Nephrolithiasis   . Hyperlipidemia   . Hypertension   . GERD (gastroesophageal reflux disease)   . Atrial fibrillation     LABS    Component Value Date/Time   NA 140 06/22/2012 0810   NA 139 01/09/2011 1135   NA 142 09/19/2010 1449   K 3.9 06/22/2012 0810   K 3.8 01/09/2011 1135   K 3.9 09/19/2010 1449   CL 104 06/22/2012 0810   CL 102 01/09/2011 1135   CL 105 09/19/2010 1449   CO2 27 06/22/2012 0810   CO2 25 01/09/2011 1135   CO2 24 09/19/2010 1449   GLUCOSE 128* 06/22/2012 0810   GLUCOSE 120* 01/09/2011 1135   GLUCOSE 74 09/19/2010 1449   BUN 21 06/22/2012 0810   BUN 19 01/09/2011 1135   BUN 19 09/19/2010 1449   CREATININE 1.32 06/22/2012 0810   CREATININE 1.23 01/09/2011 1135   CREATININE 1.16 09/19/2010 1449   CREATININE 1.20 06/23/2010 0201   CREATININE 1.15 06/22/2010 1209   CALCIUM 9.5 06/22/2012 0810   CALCIUM 9.1 01/09/2011 1135  CALCIUM 8.8 09/19/2010 1449   GFRNONAA >60 06/23/2010 0201   GFRNONAA >60 06/22/2010 1209   GFRAA  Value: >60        The eGFR has been calculated using the MDRD equation. This calculation has not been validated in all clinical situations. eGFR's persistently <60 mL/min signify possible Chronic Kidney Disease. 06/23/2010 0201   GFRAA  Value: >60        The eGFR has been calculated using the MDRD equation. This calculation has not been validated in all clinical situations. eGFR's persistently <60 mL/min signify possible Chronic Kidney Disease. 06/22/2010 1209   CMP     Component Value Date/Time   NA 140 06/22/2012 0810   K 3.9 06/22/2012 0810   CL 104 06/22/2012 0810   CO2 27 06/22/2012 0810   GLUCOSE 128* 06/22/2012 0810   BUN 21 06/22/2012 0810   CREATININE 1.32 06/22/2012 0810   CREATININE 1.20 06/23/2010 0201   CALCIUM 9.5 06/22/2012 0810   PROT 6.5 07/28/2012 0815   ALBUMIN 4.0 07/28/2012 0815   AST 27 07/28/2012 0815   ALT 28 07/28/2012 0815   ALKPHOS 55 07/28/2012 0815   BILITOT 0.9  07/28/2012 0815   GFRNONAA >60 06/23/2010 0201   GFRAA  Value: >60        The eGFR has been calculated using the MDRD equation. This calculation has not been validated in all clinical situations. eGFR's persistently <60 mL/min signify possible Chronic Kidney Disease. 06/23/2010 0201       Component Value Date/Time   WBC 8.7 08/14/2010 1005   WBC 9.6 06/23/2010 0201   WBC 9.0 06/22/2010 1209   HGB 14.3 08/14/2010 1005   HGB 13.1 06/23/2010 0201   HGB 14.1 06/22/2010 1209   HCT 42.5 08/14/2010 1005   HCT 38.1* 06/23/2010 0201   HCT 40.6 06/22/2010 1209   MCV 95.9 08/14/2010 1005   MCV 94.1 06/23/2010 0201   MCV 93.1 06/22/2010 1209    Lipid Panel     Component Value Date/Time   CHOL 215* 07/28/2012 0815   TRIG 331* 07/28/2012 0815   HDL 30* 07/28/2012 0815   CHOLHDL 7.2 07/28/2012 0815   VLDL 66* 07/28/2012 0815   LDLCALC 119* 07/28/2012 0815    ABG No results found for this basename: phart, pco2, pco2art, po2, po2art, hco3, tco2, acidbasedef, o2sat     Lab Results  Component Value Date   TSH 4.313 06/22/2012   BNP (last 3 results) No results found for this basename: PROBNP,  in the last 8760 hours Cardiac Panel (last 3 results) No results found for this basename: CKTOTAL, CKMB, TROPONINI, RELINDX,  in the last 72 hours  Iron/TIBC/Ferritin No results found for this basename: iron, tibc, ferritin     EKG Orders placed in visit on 10/28/12  . EKG 12-LEAD     Prior Assessment and Plan Problem List as of 10/28/2012   Hyperlipidemia   Last Assessment & Plan   05/21/2012 Office Visit Edited 05/24/2012 10:48 AM by Yehuda Savannah, MD     Patient has had moderate to severe hyperlipidemia in the past, and effective therapy has not been possible due to statin intolerance. He is advised to decrease the dose of pravastatin 20 mg per day if he cannot tolerate 40 and return for a lipid profile in one month.    Hypertension   Last Assessment & Plan   05/21/2012 Office Visit Written 05/21/2012   2:55 PM by Yehuda Savannah, MD     Blood  pressure control has been excellent for at least the past 2 years. He does not appear to require a loop diuretic. Furosemide will be discontinued and chlorthalidone started at a dose of 12.5 mg per day with monitoring of electrolytes and renal function.    GERD (gastroesophageal reflux disease)   Atrial fibrillation   Last Assessment & Plan   05/21/2012 Office Visit Written 05/21/2012  2:50 PM by Yehuda Savannah, MD     Continues to do fine with respect to his arrhythmia. No symptoms, and has tolerated anticoagulation well.    Chronic anticoagulation   Last Assessment & Plan   05/21/2012 Office Visit Written 05/21/2012  2:52 PM by Yehuda Savannah, MD     No evidence for blood loss or other complications of anticoagulation. No adverse effects of recent head trauma. Current medication will be continued with reassessment of CBC and FOBT.    Tobacco abuse   Last Assessment & Plan   08/20/2010 Office Visit Written 08/20/2010  5:05 PM by Lendon Colonel, NP     Cessation is recommended.    Cerebrovascular disease   Last Assessment & Plan   05/21/2012 Office Visit Edited 05/24/2012 10:47 AM by Yehuda Savannah, MD     Patient's vascular surgeon recommended annual carotid ultrasound examinations. Most recent study last year showed mild to moderate atherosclerosis without focal stenosis. A repeat test will be obtained.    Nephrolithiasis       Imaging: No results found.

## 2012-10-28 NOTE — Progress Notes (Signed)
Patient ID: Daniel Morse, male   DOB: 1941-01-31, 72 y.o.   MRN: [id number]  HPI: Scheduled return visit for continued assessment and treatment of atrial fibrillation requiring anticoagulation and hypertension.  Patient has done well in recent months with no cardiopulmonary symptoms.  Current Outpatient Prescriptions  Medication Sig Dispense Refill  . fenofibrate 160 MG tablet Take 1 tablet (160 mg total) by mouth daily.  90 tablet  3  . Multiple Vitamins-Minerals (EYE-VITES PO) Take 1 tablet by mouth daily.        Marland Kitchen omeprazole (PRILOSEC) 20 MG capsule Take 20 mg by mouth every other day.      . warfarin (COUMADIN) 2.5 MG tablet Take 1 tablet (2.5 mg total) by mouth daily. 2.5 mg all days except 3.75 mg on Monday and Friday  135 tablet  3  . atenolol-chlorthalidone (TENORETIC) 100-25 MG per tablet Take 1 tablet by mouth daily.  30 tablet  11   No current facility-administered medications for this visit.   Allergies  Allergen Reactions  . Simvastatin Other (See Comments)    Severe myalgias     Past medical history, social history, and family history reviewed and updated.  ROS: Denies chest pain, dyspnea, palpitations, lightheadedness or syncope. Good exercise tolerance without claudication. All other systems reviewed and are negative.  PHYSICAL EXAM: BP 116/76  Pulse 75  Ht 5' 6.5" (1.689 m)  Wt 92.08 kg (203 lb)  BMI 32.28 kg/m2;  Body mass index is 32.28 kg/(m^2). General-Well developed; no acute distress Body habitus-proportionate weight and height Neck-No JVD; no carotid bruits Lungs-clear lung fields; resonant to percussion Cardiovascular-normal PMI; normal S1 and S2; irregular rhythm Abdomen-normal bowel sounds; soft and non-tender without masses or organomegaly Musculoskeletal-No deformities, no cyanosis or clubbing Neurologic-Normal cranial nerves; symmetric strength and tone Skin-Warm, no significant lesions Extremities-distal pulses-1/2-1+; Doppler-monomorphic  signals with substantially decreased flow over the dorsalis pedis arteries; no edema  EKG: Atrial fibrillation with controlled ventricular response; left anterior fascicular block; delayed R-wave progression; nonspecific T wave abnormality. No previous tracing for comparison.  Jacqulyn Ducking, MD 10/28/2012  7:51 PM  ASSESSMENT AND PLAN

## 2012-10-28 NOTE — Assessment & Plan Note (Signed)
Chronic anticoagulation is warranted. We will continue to monitor for possible occult GI blood loss.

## 2012-10-28 NOTE — Assessment & Plan Note (Signed)
Heart rate control is good, but patient reports that his medication co-pays total $100 per month, which he cannot afford.  Metoprolol, chlorthalidone and digoxin will be discontinued. Atenolol/chlorthalidone 100/25 mg will be substituted with monitoring of heart rate and blood pressure control.

## 2012-10-28 NOTE — Patient Instructions (Addendum)
Your physician recommends that you schedule a follow-up appointment in: ONE YEAR FOR Dr. Larina Bras FOR A RHYTHM STRIP   Your physician recommends that you return for lab work in: [place] [facility] (Your physician recommends that you have follow up lab work, we will mail you a reminder letter to alert you when to go [employer], located across the street from our office.   Your physician has recommended you make the following change in your medication:   1) STOP TAKING DIGOXIN 2) STOP TAKING CHLORTHALIDONE 3) STOP TAKING METOPROLOL 4) START TAKING ATENOLOL/CHOLORTHALIDONE 100/25MG  ONCE DAILY

## 2012-10-28 NOTE — Assessment & Plan Note (Addendum)
Blood pressure control is excellent, but substantial changes in medication have been occasioned by financial considerations. Patient will monitor blood pressure and returns for reassessment in 2 weeks at which time vital signs will be checked as well as a rhythm strip.

## 2012-11-04 ENCOUNTER — Other Ambulatory Visit: Payer: Self-pay | Admitting: *Deleted

## 2012-11-04 MED ORDER — ATENOLOL-CHLORTHALIDONE 100-25 MG PO TABS: 1.0000 | ORAL_TABLET | Freq: Every day | ORAL | Status: DC

## 2012-11-05 DIAGNOSIS — I739 Peripheral vascular disease, unspecified: Secondary | ICD-10-CM | POA: Insufficient documentation

## 2012-11-11 ENCOUNTER — Ambulatory Visit (INDEPENDENT_AMBULATORY_CARE_PROVIDER_SITE_OTHER): Payer: Medicare Other | Admitting: *Deleted

## 2012-11-11 VITALS — BP 137/94 | HR 81 | Ht 65.0 in | Wt 201.0 lb

## 2012-11-11 DIAGNOSIS — Z7901 Long term (current) use of anticoagulants: Secondary | ICD-10-CM

## 2012-11-11 DIAGNOSIS — I679 Cerebrovascular disease, unspecified: Secondary | ICD-10-CM

## 2012-11-11 DIAGNOSIS — G450 Vertebro-basilar artery syndrome: Secondary | ICD-10-CM

## 2012-11-11 DIAGNOSIS — I4891 Unspecified atrial fibrillation: Secondary | ICD-10-CM

## 2012-11-11 DIAGNOSIS — IMO0002 Reserved for concepts with insufficient information to code with codable children: Secondary | ICD-10-CM

## 2012-11-11 LAB — POCT INR: INR: 4.8

## 2012-11-11 MED ORDER — WARFARIN SODIUM 2.5 MG PO TABS: ORAL_TABLET | ORAL | Status: DC

## 2012-11-11 NOTE — Progress Notes (Signed)
Pt presented to nurse visit today PER RECENT MEDICATION CHANGES, RHYTHM STRIP PERFORMED AND SHOWN TO DR BRANCH FOR VERIFICATION THAT PT IS CLEARED TO LEAVE THE OFFICE WITH NO CONCERNS NOTED, STRIP TO BE SCANNED INTO THE CHART FOR REVIEW Pt voiced no complaints today  Please advise if any further instructions are neccessary   Daniel Millin A. Antonisha Morse L.P.N.  Last OV D/C Summary and VS:  BP 116/76  Pulse 75  Ht 5' 6.5" (1.689 m)  Wt 92.08 kg (203 lb)  BMI 32.28 kg/m2;   Your physician recommends that you schedule a follow-up appointment in: ONE YEAR FOR Dr. Larina Bras FOR A RHYTHM STRIP  Your physician recommends that you return for lab work in: [place] [facility] (Your physician recommends that you have follow up lab work, we will mail you a reminder letter to alert you when to go [employer], located across the street from our office.  Your physician has recommended you make the following change in your medication:  1) STOP TAKING DIGOXIN  2) STOP TAKING CHLORTHALIDONE  3) STOP TAKING METOPROLOL  4) START TAKING ATENOLOL/CHOLORTHALIDONE 100/25MG  ONCE DAILY

## 2012-11-11 NOTE — Patient Instructions (Addendum)
Your physician recommends that you schedule a follow-up appointment in: TO BE DETERMINED   

## 2012-11-25 ENCOUNTER — Encounter: Payer: Self-pay | Admitting: *Deleted

## 2012-11-25 ENCOUNTER — Other Ambulatory Visit: Payer: Self-pay | Admitting: *Deleted

## 2012-11-25 ENCOUNTER — Ambulatory Visit (INDEPENDENT_AMBULATORY_CARE_PROVIDER_SITE_OTHER): Payer: Medicare Other | Admitting: *Deleted

## 2012-11-25 DIAGNOSIS — I679 Cerebrovascular disease, unspecified: Secondary | ICD-10-CM

## 2012-11-25 DIAGNOSIS — Z7901 Long term (current) use of anticoagulants: Secondary | ICD-10-CM

## 2012-11-25 DIAGNOSIS — G450 Vertebro-basilar artery syndrome: Secondary | ICD-10-CM

## 2012-11-25 DIAGNOSIS — IMO0002 Reserved for concepts with insufficient information to code with codable children: Secondary | ICD-10-CM

## 2012-11-25 DIAGNOSIS — I10 Essential (primary) hypertension: Secondary | ICD-10-CM

## 2012-11-25 DIAGNOSIS — I4891 Unspecified atrial fibrillation: Secondary | ICD-10-CM

## 2012-11-25 DIAGNOSIS — E782 Mixed hyperlipidemia: Secondary | ICD-10-CM

## 2012-11-25 LAB — POCT INR: INR: 2.7

## 2012-11-30 ENCOUNTER — Other Ambulatory Visit: Payer: Self-pay | Admitting: Cardiology

## 2012-11-30 LAB — BASIC METABOLIC PANEL
CO2: 29 mEq/L (ref 19–32)
Calcium: 9.6 mg/dL (ref 8.4–10.5)
Creat: 1.16 mg/dL (ref 0.50–1.35)
Sodium: 138 mEq/L (ref 135–145)

## 2012-11-30 LAB — LIPID PANEL
Cholesterol: 277 mg/dL — ABNORMAL HIGH (ref 0–200)
HDL: 34 mg/dL — ABNORMAL LOW (ref >39)
Total CHOL/HDL Ratio: 8.1 Ratio
Triglycerides: 319 mg/dL — ABNORMAL HIGH (ref <150)

## 2012-11-30 LAB — HEPATIC FUNCTION PANEL
ALT: 25 U/L (ref 0–53)
AST: 20 U/L (ref 0–37)
Albumin: 4.2 g/dL (ref 3.5–5.2)
Alkaline Phosphatase: 48 U/L (ref 39–117)
Total Protein: 6.7 g/dL (ref 6.0–8.3)

## 2012-12-01 ENCOUNTER — Telehealth: Payer: Self-pay | Admitting: *Deleted

## 2012-12-01 NOTE — Telephone Encounter (Signed)
PLEASE NOTE ALL OPEN ENCOUNTERS WERE TO BE CLOSED DUE TO CHANGEOVER INTO [facility] AND CHANGED TO PHONE NOTES PER YELENA LESTER  PLEASE ADVISE ON THIS NURSE VISIT PER FORMER DR RR PT  BP Pulse Ht Wt BMI     137/94 81 5\' 5"  (1.651 m) 201 lb (91.173 kg) 33.45 kg/m2    Progress Notes    Alba Destine, LPN at 2/29/7989 21:19 AM    Status: Sign at close encounter             Pt presented to nurse visit today PER RECENT MEDICATION CHANGES, RHYTHM STRIP PERFORMED AND SHOWN TO DR BRANCH FOR VERIFICATION THAT PT IS CLEARED TO LEAVE THE OFFICE WITH NO CONCERNS NOTED, STRIP TO BE SCANNED INTO THE CHART FOR REVIEW  Pt voiced no complaints today  Please advise if any further instructions are neccessary  Joelene Millin A. Albino Bufford L.P.N.  Last OV D/C Summary and VS:  BP 116/76  Pulse 75  Ht 5' 6.5" (1.689 m)  Wt 92.08 kg (203 lb)  BMI 32.28 kg/m2;  Your physician recommends that you schedule a follow-up appointment in: ONE YEAR FOR Dr. Larina Bras FOR A RHYTHM STRIP  Your physician recommends that you return for lab work in: [place] [facility] (Your physician recommends that you have follow up lab work, we will mail you a reminder letter to alert you when to go [employer], located across the street from our office.  Your physician has recommended you make the following change in your medication:  1) STOP TAKING DIGOXIN  2) STOP TAKING CHLORTHALIDONE  3) STOP TAKING METOPROLOL  4) START TAKING ATENOLOL/CHOLORTHALIDONE 100/25MG  ONCE DAILY

## 2012-12-03 DIAGNOSIS — E876 Hypokalemia: Secondary | ICD-10-CM | POA: Insufficient documentation

## 2012-12-07 ENCOUNTER — Encounter: Payer: Self-pay | Admitting: *Deleted

## 2012-12-23 ENCOUNTER — Ambulatory Visit (INDEPENDENT_AMBULATORY_CARE_PROVIDER_SITE_OTHER): Payer: Medicare Other | Admitting: *Deleted

## 2012-12-23 DIAGNOSIS — IMO0002 Reserved for concepts with insufficient information to code with codable children: Secondary | ICD-10-CM

## 2012-12-23 DIAGNOSIS — I679 Cerebrovascular disease, unspecified: Secondary | ICD-10-CM

## 2012-12-23 DIAGNOSIS — I4891 Unspecified atrial fibrillation: Secondary | ICD-10-CM

## 2012-12-23 DIAGNOSIS — Z7901 Long term (current) use of anticoagulants: Secondary | ICD-10-CM

## 2012-12-23 DIAGNOSIS — G450 Vertebro-basilar artery syndrome: Secondary | ICD-10-CM

## 2012-12-23 LAB — POCT INR: INR: 2.4

## 2013-01-13 ENCOUNTER — Telehealth: Payer: Self-pay | Admitting: Cardiology

## 2013-01-13 MED ORDER — ATENOLOL-CHLORTHALIDONE 100-25 MG PO TABS: 1.0000 | ORAL_TABLET | Freq: Every day | ORAL | Status: DC

## 2013-01-13 NOTE — Telephone Encounter (Signed)
Received fax refill request  Rx #  Medication:  Atenol / Chlor tab Qty 90 Sig:  Take one by mouth daily Physician:  Lattie Haw

## 2013-01-18 ENCOUNTER — Telehealth: Payer: Self-pay | Admitting: Cardiology

## 2013-01-18 MED ORDER — ATENOLOL-CHLORTHALIDONE 100-25 MG PO TABS: 1.0000 | ORAL_TABLET | Freq: Every day | ORAL | Status: DC

## 2013-01-18 NOTE — Telephone Encounter (Signed)
Medication sent via escribe.  

## 2013-01-18 NOTE — Telephone Encounter (Signed)
Received fax refill request  Rx #  Medication:  Atenol / CHLOR tab Qty 90 Sig:  Take one by mouth daily Physician:  Lattie Haw

## 2013-01-20 ENCOUNTER — Ambulatory Visit (INDEPENDENT_AMBULATORY_CARE_PROVIDER_SITE_OTHER): Payer: Medicare Other | Admitting: *Deleted

## 2013-01-20 DIAGNOSIS — I4891 Unspecified atrial fibrillation: Secondary | ICD-10-CM

## 2013-01-20 DIAGNOSIS — G450 Vertebro-basilar artery syndrome: Secondary | ICD-10-CM

## 2013-01-20 DIAGNOSIS — I679 Cerebrovascular disease, unspecified: Secondary | ICD-10-CM

## 2013-01-20 DIAGNOSIS — Z7901 Long term (current) use of anticoagulants: Secondary | ICD-10-CM

## 2013-01-20 DIAGNOSIS — IMO0002 Reserved for concepts with insufficient information to code with codable children: Secondary | ICD-10-CM

## 2013-02-04 ENCOUNTER — Ambulatory Visit (INDEPENDENT_AMBULATORY_CARE_PROVIDER_SITE_OTHER): Payer: Medicare Other | Admitting: Otolaryngology

## 2013-02-04 ENCOUNTER — Encounter (INDEPENDENT_AMBULATORY_CARE_PROVIDER_SITE_OTHER): Payer: Self-pay

## 2013-02-04 DIAGNOSIS — H905 Unspecified sensorineural hearing loss: Secondary | ICD-10-CM

## 2013-02-04 DIAGNOSIS — R42 Dizziness and giddiness: Secondary | ICD-10-CM

## 2013-02-05 ENCOUNTER — Other Ambulatory Visit (INDEPENDENT_AMBULATORY_CARE_PROVIDER_SITE_OTHER): Payer: Self-pay | Admitting: Otolaryngology

## 2013-02-05 DIAGNOSIS — R42 Dizziness and giddiness: Secondary | ICD-10-CM

## 2013-02-10 ENCOUNTER — Ambulatory Visit (HOSPITAL_COMMUNITY)
Admission: RE | Admit: 2013-02-10 | Discharge: 2013-02-10 | Disposition: A | Discharge status: Home / Self Care | Payer: Medicare Other | Source: Ambulatory Visit | Attending: Otolaryngology | Admitting: Otolaryngology

## 2013-02-10 DIAGNOSIS — H9190 Unspecified hearing loss, unspecified ear: Secondary | ICD-10-CM | POA: Insufficient documentation

## 2013-02-10 DIAGNOSIS — R42 Dizziness and giddiness: Secondary | ICD-10-CM | POA: Insufficient documentation

## 2013-02-10 DIAGNOSIS — I6789 Other cerebrovascular disease: Secondary | ICD-10-CM | POA: Insufficient documentation

## 2013-02-10 LAB — POCT I-STAT CREATININE: Creatinine, Ser: 1.6 mg/dL — ABNORMAL HIGH (ref 0.50–1.35)

## 2013-02-10 MED ORDER — GADOBENATE DIMEGLUMINE 529 MG/ML IV SOLN
19.0000 mL | Freq: Once | INTRAVENOUS | Status: AC | PRN
  Administered 2013-02-10: 19 mL via INTRAVENOUS

## 2013-03-03 ENCOUNTER — Ambulatory Visit (INDEPENDENT_AMBULATORY_CARE_PROVIDER_SITE_OTHER): Payer: Medicare Other | Admitting: *Deleted

## 2013-03-03 DIAGNOSIS — G450 Vertebro-basilar artery syndrome: Secondary | ICD-10-CM

## 2013-03-03 DIAGNOSIS — I679 Cerebrovascular disease, unspecified: Secondary | ICD-10-CM

## 2013-03-03 DIAGNOSIS — IMO0002 Reserved for concepts with insufficient information to code with codable children: Secondary | ICD-10-CM

## 2013-03-03 DIAGNOSIS — I4891 Unspecified atrial fibrillation: Secondary | ICD-10-CM

## 2013-03-03 DIAGNOSIS — Z7901 Long term (current) use of anticoagulants: Secondary | ICD-10-CM

## 2013-03-03 LAB — POCT INR: INR: 3

## 2013-04-14 ENCOUNTER — Ambulatory Visit (INDEPENDENT_AMBULATORY_CARE_PROVIDER_SITE_OTHER): Payer: Medicare Other | Admitting: *Deleted

## 2013-04-14 DIAGNOSIS — I4891 Unspecified atrial fibrillation: Secondary | ICD-10-CM

## 2013-04-14 DIAGNOSIS — Z5181 Encounter for therapeutic drug level monitoring: Secondary | ICD-10-CM

## 2013-04-14 DIAGNOSIS — IMO0002 Reserved for concepts with insufficient information to code with codable children: Secondary | ICD-10-CM

## 2013-04-14 DIAGNOSIS — Z7901 Long term (current) use of anticoagulants: Secondary | ICD-10-CM

## 2013-04-14 DIAGNOSIS — I679 Cerebrovascular disease, unspecified: Secondary | ICD-10-CM

## 2013-04-14 DIAGNOSIS — G450 Vertebro-basilar artery syndrome: Secondary | ICD-10-CM

## 2013-04-14 LAB — POCT INR: INR: 2.9

## 2013-04-14 MED ORDER — ATENOLOL-CHLORTHALIDONE 100-25 MG PO TABS: 1.0000 | ORAL_TABLET | Freq: Every day | ORAL | Status: DC

## 2013-04-19 ENCOUNTER — Other Ambulatory Visit (HOSPITAL_COMMUNITY): Payer: Self-pay | Admitting: Family Medicine

## 2013-04-19 DIAGNOSIS — I6529 Occlusion and stenosis of unspecified carotid artery: Secondary | ICD-10-CM

## 2013-04-26 ENCOUNTER — Ambulatory Visit (HOSPITAL_COMMUNITY)
Admission: RE | Admit: 2013-04-26 | Discharge: 2013-04-26 | Disposition: A | Discharge status: Home / Self Care | Payer: Medicare Other | Source: Ambulatory Visit | Attending: Family Medicine | Admitting: Family Medicine

## 2013-04-26 DIAGNOSIS — R55 Syncope and collapse: Secondary | ICD-10-CM | POA: Insufficient documentation

## 2013-04-26 DIAGNOSIS — I658 Occlusion and stenosis of other precerebral arteries: Secondary | ICD-10-CM | POA: Insufficient documentation

## 2013-04-26 DIAGNOSIS — I6529 Occlusion and stenosis of unspecified carotid artery: Secondary | ICD-10-CM | POA: Insufficient documentation

## 2013-04-26 DIAGNOSIS — I10 Essential (primary) hypertension: Secondary | ICD-10-CM | POA: Insufficient documentation

## 2013-04-26 DIAGNOSIS — R42 Dizziness and giddiness: Secondary | ICD-10-CM | POA: Insufficient documentation

## 2013-05-26 ENCOUNTER — Ambulatory Visit (INDEPENDENT_AMBULATORY_CARE_PROVIDER_SITE_OTHER): Payer: Medicare Other | Admitting: *Deleted

## 2013-05-26 DIAGNOSIS — I4891 Unspecified atrial fibrillation: Secondary | ICD-10-CM

## 2013-05-26 DIAGNOSIS — G450 Vertebro-basilar artery syndrome: Secondary | ICD-10-CM

## 2013-05-26 DIAGNOSIS — I679 Cerebrovascular disease, unspecified: Secondary | ICD-10-CM

## 2013-05-26 DIAGNOSIS — IMO0002 Reserved for concepts with insufficient information to code with codable children: Secondary | ICD-10-CM

## 2013-05-26 DIAGNOSIS — Z5181 Encounter for therapeutic drug level monitoring: Secondary | ICD-10-CM

## 2013-05-26 DIAGNOSIS — Z7901 Long term (current) use of anticoagulants: Secondary | ICD-10-CM

## 2013-05-26 LAB — POCT INR: INR: 2.3

## 2013-07-07 ENCOUNTER — Ambulatory Visit (INDEPENDENT_AMBULATORY_CARE_PROVIDER_SITE_OTHER): Payer: Medicare Other | Admitting: *Deleted

## 2013-07-07 DIAGNOSIS — IMO0002 Reserved for concepts with insufficient information to code with codable children: Secondary | ICD-10-CM

## 2013-07-07 DIAGNOSIS — Z5181 Encounter for therapeutic drug level monitoring: Secondary | ICD-10-CM

## 2013-07-07 DIAGNOSIS — I679 Cerebrovascular disease, unspecified: Secondary | ICD-10-CM

## 2013-07-07 DIAGNOSIS — G450 Vertebro-basilar artery syndrome: Secondary | ICD-10-CM

## 2013-07-07 DIAGNOSIS — Z7901 Long term (current) use of anticoagulants: Secondary | ICD-10-CM

## 2013-07-07 DIAGNOSIS — I4891 Unspecified atrial fibrillation: Secondary | ICD-10-CM

## 2013-07-07 LAB — POCT INR: INR: 3.3

## 2013-10-19 ENCOUNTER — Telehealth: Payer: Self-pay | Admitting: Cardiology

## 2013-10-19 NOTE — Telephone Encounter (Signed)
Patient would like return call from nurse regarding his potassium being low. / tgs

## 2013-10-19 NOTE — Telephone Encounter (Signed)
Pt had labs by Dr.Golding and reports K+ 2.8. He states Dr.Golding is going to start him on K+ supplement  And was asking if that was ok with his other heart meds.I assured him it was

## 2013-11-03 ENCOUNTER — Emergency Department (HOSPITAL_COMMUNITY)
Admission: EM | Admit: 2013-11-03 | Discharge: 2013-11-03 | Disposition: A | Discharge status: Home / Self Care | Payer: Medicare Other | Attending: Emergency Medicine | Admitting: Emergency Medicine

## 2013-11-03 ENCOUNTER — Encounter (HOSPITAL_COMMUNITY): Payer: Self-pay | Admitting: Emergency Medicine

## 2013-11-03 ENCOUNTER — Emergency Department (HOSPITAL_COMMUNITY): Payer: Medicare Other

## 2013-11-03 DIAGNOSIS — Z87442 Personal history of urinary calculi: Secondary | ICD-10-CM | POA: Diagnosis not present

## 2013-11-03 DIAGNOSIS — K219 Gastro-esophageal reflux disease without esophagitis: Secondary | ICD-10-CM | POA: Diagnosis not present

## 2013-11-03 DIAGNOSIS — R61 Generalized hyperhidrosis: Secondary | ICD-10-CM | POA: Insufficient documentation

## 2013-11-03 DIAGNOSIS — R1013 Epigastric pain: Secondary | ICD-10-CM | POA: Insufficient documentation

## 2013-11-03 DIAGNOSIS — Z87891 Personal history of nicotine dependence: Secondary | ICD-10-CM | POA: Insufficient documentation

## 2013-11-03 DIAGNOSIS — R079 Chest pain, unspecified: Secondary | ICD-10-CM | POA: Insufficient documentation

## 2013-11-03 DIAGNOSIS — E785 Hyperlipidemia, unspecified: Secondary | ICD-10-CM | POA: Insufficient documentation

## 2013-11-03 DIAGNOSIS — Z7901 Long term (current) use of anticoagulants: Secondary | ICD-10-CM | POA: Insufficient documentation

## 2013-11-03 DIAGNOSIS — H548 Legal blindness, as defined in USA: Secondary | ICD-10-CM | POA: Diagnosis not present

## 2013-11-03 DIAGNOSIS — I10 Essential (primary) hypertension: Secondary | ICD-10-CM | POA: Diagnosis not present

## 2013-11-03 DIAGNOSIS — Z860100 Personal history of colon polyps, unspecified: Secondary | ICD-10-CM | POA: Insufficient documentation

## 2013-11-03 DIAGNOSIS — Z79899 Other long term (current) drug therapy: Secondary | ICD-10-CM | POA: Insufficient documentation

## 2013-11-03 DIAGNOSIS — R0789 Other chest pain: Secondary | ICD-10-CM | POA: Diagnosis not present

## 2013-11-03 DIAGNOSIS — Z8673 Personal history of transient ischemic attack (TIA), and cerebral infarction without residual deficits: Secondary | ICD-10-CM | POA: Insufficient documentation

## 2013-11-03 LAB — HEPATIC FUNCTION PANEL
ALT: 24 U/L (ref 0–53)
AST: 23 U/L (ref 0–37)
Albumin: 3.8 g/dL (ref 3.5–5.2)
Alkaline Phosphatase: 86 U/L (ref 39–117)
Total Bilirubin: 1.1 mg/dL (ref 0.3–1.2)
Total Protein: 7.4 g/dL (ref 6.0–8.3)

## 2013-11-03 LAB — TROPONIN I: Troponin I: <0.3 ng/mL (ref <0.30)

## 2013-11-03 LAB — LIPASE, BLOOD: LIPASE: 39 U/L (ref 11–59)

## 2013-11-03 LAB — CBC
HCT: 46.8 % (ref 39.0–52.0)
Hemoglobin: 16.8 g/dL (ref 13.0–17.0)
MCH: 33.7 pg (ref 26.0–34.0)
MCHC: 35.9 g/dL (ref 30.0–36.0)
MCV: 94 fL (ref 78.0–100.0)
PLATELETS: 181 10*3/uL (ref 150–400)
RBC: 4.98 MIL/uL (ref 4.22–5.81)
RDW: 14 % (ref 11.5–15.5)
WBC: 7.9 10*3/uL (ref 4.0–10.5)

## 2013-11-03 LAB — BASIC METABOLIC PANEL
ANION GAP: 16 — AB (ref 5–15)
BUN: 19 mg/dL (ref 6–23)
CO2: 25 meq/L (ref 19–32)
Calcium: 9.5 mg/dL (ref 8.4–10.5)
Chloride: 101 mEq/L (ref 96–112)
Creatinine, Ser: 0.94 mg/dL (ref 0.50–1.35)
GFR calc Af Amer: >90 mL/min (ref >90)
GFR calc non Af Amer: 82 mL/min — ABNORMAL LOW (ref >90)
GLUCOSE: 139 mg/dL — AB (ref 70–99)
POTASSIUM: 3.6 meq/L — AB (ref 3.7–5.3)
SODIUM: 142 meq/L (ref 137–147)

## 2013-11-03 LAB — PRO B NATRIURETIC PEPTIDE: PRO B NATRI PEPTIDE: 664.3 pg/mL — AB (ref 0–125)

## 2013-11-03 LAB — PROTIME-INR
INR: 1.52 — AB (ref 0.00–1.49)
Prothrombin Time: 18.3 seconds — ABNORMAL HIGH (ref 11.6–15.2)

## 2013-11-03 MED ORDER — NITROGLYCERIN 0.4 MG SL SUBL
0.4000 mg | SUBLINGUAL_TABLET | Freq: Once | SUBLINGUAL | Status: AC
  Administered 2013-11-03: 0.4 mg via SUBLINGUAL

## 2013-11-03 MED ORDER — GI COCKTAIL ~~LOC~~
30.0000 mL | Freq: Once | ORAL | Status: AC
  Administered 2013-11-03: 30 mL via ORAL
  Filled 2013-11-03: qty 30

## 2013-11-03 MED ORDER — NITROGLYCERIN 0.4 MG SL SUBL
SUBLINGUAL_TABLET | SUBLINGUAL | Status: AC
  Filled 2013-11-03: qty 1

## 2013-11-03 MED ORDER — DILTIAZEM HCL 30 MG PO TABS
30.0000 mg | ORAL_TABLET | Freq: Once | ORAL | Status: AC
  Administered 2013-11-03: 30 mg via ORAL
  Filled 2013-11-03: qty 1

## 2013-11-03 MED ORDER — MORPHINE SULFATE 4 MG/ML IJ SOLN: 4.0000 mg | Freq: Once | INTRAMUSCULAR | Status: DC

## 2013-11-03 MED ORDER — MORPHINE SULFATE 4 MG/ML IJ SOLN
4.0000 mg | Freq: Once | INTRAMUSCULAR | Status: AC
  Administered 2013-11-03: 4 mg via INTRAVENOUS
  Filled 2013-11-03: qty 1

## 2013-11-03 MED ORDER — DILTIAZEM HCL 30 MG PO TABS: 30.0000 mg | ORAL_TABLET | Freq: Two times a day (BID) | ORAL | Status: DC

## 2013-11-03 MED ORDER — PANTOPRAZOLE SODIUM 40 MG IV SOLR
40.0000 mg | Freq: Once | INTRAVENOUS | Status: AC
  Administered 2013-11-03: 40 mg via INTRAVENOUS
  Filled 2013-11-03: qty 40

## 2013-11-03 NOTE — ED Notes (Signed)
Pt reports chest pain has increased. Pt reports epigastric tenderness, denies any change in pain with movement or deep breath. nad noted.EDP aware and give verbal order to admin $Remove'4mg'sCeJsfo$  of morphine IV.

## 2013-11-03 NOTE — ED Notes (Signed)
MD Zammit at bedside for re-eval.

## 2013-11-03 NOTE — ED Notes (Signed)
Dr.Zammit give verbal order to admin SL nitro x1 for pt continued pain.

## 2013-11-03 NOTE — Discharge Instructions (Signed)
Increase your prilosec to twice a day.  Start the new prescription.   Follow up with the heart md in 1-2 days.  They should call you with an appointment.    Follow up with dr. Wonda Amis in 1 week.

## 2013-11-03 NOTE — ED Provider Notes (Signed)
CSN: [id number]     Arrival date & time 11/03/13  9622 History   First MD Initiated Contact with Patient 11/03/13 1027    This chart was scribed for Maudry Diego, MD by Edison Simon, ED Scribe. This patient was seen in room APA05/APA05 and the patient's care was started at 10:33 AM.    Chief Complaint  Patient presents with  . Chest Pain   Patient is a 73 y.o. male presenting with chest pain. The history is provided by the patient. No language interpreter was used.  Chest Pain Pain location:  Substernal area Pain quality: not sharp   Pain radiates to:  Does not radiate Pain radiates to the back: no   Pain severity:  Mild Timing:  Constant Progression:  Unchanged Chronicity:  New Relieved by:  Nothing Worsened by:  Nothing tried Ineffective treatments:  None tried Associated symptoms: no abdominal pain, no back pain, no cough, no fatigue, no headache and no shortness of breath   Associated symptoms comment:  Frequent burping Risk factors: high cholesterol and hypertension     HPI Comments: Daniel Morse is a 73 y.o. male with a history of hyperlipidemia, hypertension, GERD, and atrial flutter who presents to the Emergency Department complaining of substernal chest pain with onset this morning. He describes his pain as uncomfortable but not sharp. He denies similar symptoms previous. The patient reports associated frequent burping. He denies shortness of breath or diaphoresis above baseline.   Past Medical History  Diagnosis Date  . Hyperlipidemia     Metabolic syndrome with increased triglycerides  . Hypertension   . GERD (gastroesophageal reflux disease)     h/o esophagitis  . Atrial flutter 06/2010  . Nephrolithiasis   . Chest pain     negative stress nuclear in 11/2002  . Macular degeneration     legally blind  . Tobacco abuse     50 pack years; 1/2 pack per day  . Cerebrovascular disease 2008    Left CEA  . Chronic anticoagulation   . History of colonic polyps      Colonoscopy and polypectomy in 03/2011   Past Surgical History  Procedure Laterality Date  . Cystoscopy w/ ureteral stent placement      and stone extraction  . Carotid endarterectomy  2008    Left  . Colonoscopy  04/04/2011    Rogene Houston, MD;  multiple polyps ablated   Family History  Problem Relation Age of Onset  . Colon cancer Paternal Uncle    History  Substance Use Topics  . Smoking status: Former Smoker -- 0.50 packs/day for 50 years    Types: Cigarettes  . Smokeless tobacco: Former Systems developer    Quit date: 06/01/2010  . Alcohol Use: Yes     Comment: daily    Review of Systems  Constitutional: Negative for appetite change and fatigue.  HENT: Negative for congestion, ear discharge and sinus pressure.   Eyes: Negative for discharge.  Respiratory: Negative for cough and shortness of breath.   Cardiovascular: Positive for chest pain.  Gastrointestinal: Negative for abdominal pain and diarrhea.  Genitourinary: Negative for frequency and hematuria.  Musculoskeletal: Negative for back pain.  Skin: Negative for rash.       Diaphoretic but not above baseline  Neurological: Negative for seizures and headaches.  Psychiatric/Behavioral: Negative for hallucinations.      Allergies  Simvastatin  Home Medications   Prior to Admission medications   Medication Sig Start Date End Date Taking?  Authorizing Provider  atenolol-chlorthalidone (TENORETIC) 100-25 MG per tablet Take 1 tablet by mouth daily. 04/14/13   Herminio Commons, MD  fenofibrate 160 MG tablet Take 1 tablet (160 mg total) by mouth daily. 06/17/11   Yehuda Savannah, MD  Multiple Vitamins-Minerals (EYE-VITES PO) Take 1 tablet by mouth daily.      Historical Provider, MD  omeprazole (PRILOSEC) 20 MG capsule Take 20 mg by mouth every other day.    Historical Provider, MD  warfarin (COUMADIN) 2.5 MG tablet Take coumadin 1 tablet daily except 1 1/2 tablets on Wednesdays 11/11/12   Herminio Commons, MD   BP  125/106  Pulse 107  Temp(Src) 97.6 F (36.4 C) (Oral)  Resp 17  Ht $R'5\' 7"'yk$  (1.702 m)  Wt 215 lb (97.523 kg)  BMI 33.67 kg/m2  SpO2 95% Physical Exam  Constitutional: He is oriented to person, place, and time. He appears well-developed.  HENT:  Head: Normocephalic.  Eyes: Conjunctivae and EOM are normal. No scleral icterus.  Neck: Neck supple. No thyromegaly present.  Cardiovascular: Exam reveals no gallop and no friction rub.   No murmur heard. Irregular and rapid heartbeat  Pulmonary/Chest: No stridor. He has no wheezes. He has no rales. He exhibits no tenderness.  Abdominal: He exhibits no distension. There is tenderness (mild epigastric tenderness). There is no rebound.  Musculoskeletal: Normal range of motion. He exhibits no edema.  Lymphadenopathy:    He has no cervical adenopathy.  Neurological: He is oriented to person, place, and time. He exhibits normal muscle tone. Coordination normal.  Skin: No rash noted. No erythema.  Psychiatric: He has a normal mood and affect. His behavior is normal.    ED Course  Procedures (including critical care time) Labs Review Labs Reviewed  CBC  BASIC METABOLIC PANEL  TROPONIN I    Imaging Review No results found.   EKG Interpretation   Date/Time:  Wednesday November 03 2013 10:03:53 EDT Ventricular Rate:  102 PR Interval:    QRS Duration: 93 QT Interval:  419 QTC Calculation: 546 R Axis:   -55 Text Interpretation:  Atrial fibrillation Left anterior fascicular block  Low voltage, precordial leads Consider anterior infarct Prolonged QT  interval Confirmed by Daphine Loch  MD, Clifton Kovacic [phone]) on 11/03/2013 2:55:00 PM     DIAGNOSTIC STUDIES: Oxygen Saturation is 95% on room air, adequate by my interpretation.    COORDINATION OF CARE:    MDM   Final diagnoses:  None    I spoke with cardiology and we will start cardiazem and he will follow up in 1-2 days   The chart was scribed for me under my direct supervision.  I  personally performed the history, physical, and medical decision making and all procedures in the evaluation of this patient.Maudry Diego, MD 11/03/13 [phone]

## 2013-11-03 NOTE — ED Notes (Signed)
Mid cp starting this morning with nausea.  Denies dizziness/sob/vomiting.

## 2013-11-04 ENCOUNTER — Encounter: Payer: Medicare Other | Admitting: Adult Health

## 2013-11-04 NOTE — Progress Notes (Signed)
No show

## 2013-11-05 ENCOUNTER — Ambulatory Visit (INDEPENDENT_AMBULATORY_CARE_PROVIDER_SITE_OTHER): Payer: Medicare Other | Admitting: Cardiology

## 2013-11-05 ENCOUNTER — Encounter: Payer: Self-pay | Admitting: Cardiology

## 2013-11-05 VITALS — BP 102/68 | HR 71 | Ht 67.0 in | Wt 214.0 lb

## 2013-11-05 DIAGNOSIS — I4891 Unspecified atrial fibrillation: Secondary | ICD-10-CM

## 2013-11-05 NOTE — Patient Instructions (Signed)
Your physician recommends that you schedule a follow-up appointment in: 4 months    Keep BP log   And bring at back in 2 weeks for nurse to review   Make an apt for [place] today

## 2013-11-05 NOTE — Progress Notes (Signed)
Clinical Summary Daniel Morse is a 73 y.o.male former patient of Daniel Morse, this is our first visit together. He is seen for the following medical problems. This is an add on post- ER visit.   1. Afib - denies any palpitaitons - on couamadin,denies any bleeding issues - rates of 1110 noted during recent ER visit with epigastric pain, oral dilt was started on top of his home atenolol  2. Epigastric pain - seen in ER 11/03/13 with chest pain - associated with frequent burping. Pain came on and lasted constantly approx a day and a half. No other associated symptoms. Per ER notes mildly tender to palpation in epigastric area - EKG without acute ischemic changes. Trop neg x1. CXR with no acute process.  - has outpatient GI follow up  Past Medical History  Diagnosis Date  . Hyperlipidemia     Metabolic syndrome with increased triglycerides  . Hypertension   . GERD (gastroesophageal reflux disease)     h/o esophagitis  . Atrial flutter 06/2010  . Nephrolithiasis   . Chest pain     negative stress nuclear in 11/2002  . Macular degeneration     legally blind  . Tobacco abuse     50 pack years; 1/2 pack per day  . Cerebrovascular disease 2008    Left CEA  . Chronic anticoagulation   . History of colonic polyps     Colonoscopy and polypectomy in 03/2011     Allergies  Allergen Reactions  . Simvastatin Other (See Comments)    Severe myalgias     Current Outpatient Prescriptions  Medication Sig Dispense Refill  . atenolol-chlorthalidone (TENORETIC) 100-25 MG per tablet Take 1 tablet by mouth daily.  90 tablet  3  . diltiazem (CARDIZEM) 30 MG tablet Take 1 tablet (30 mg total) by mouth 2 (two) times daily.  60 tablet  0  . omeprazole (PRILOSEC) 20 MG capsule Take 20 mg by mouth 2 (two) times daily.       . potassium chloride SA (K-DUR,KLOR-CON) 20 MEQ tablet Take 20 mEq by mouth 2 (two) times daily.      Marland Kitchen Specialty Vitamins Products (ICAPS LUTEIN-ZEAXANTHIN PO) Take 1 tablet  by mouth daily.      Marland Kitchen warfarin (COUMADIN) 2.5 MG tablet Take 2.5-3.75 mg by mouth daily. Patient takes 2.$RemoveBeforeD'5mg'vdZNVJMJZrPdSa$  daily & 3.$RemoveB'75mg'mXPvMTBL$  on Wednesday       No current facility-administered medications for this visit.     Past Surgical History  Procedure Laterality Date  . Cystoscopy w/ ureteral stent placement      and stone extraction  . Carotid endarterectomy  2008    Left  . Colonoscopy  04/04/2011    Daniel Houston, MD;  multiple polyps ablated     Allergies  Allergen Reactions  . Simvastatin Other (See Comments)    Severe myalgias      Family History  Problem Relation Age of Onset  . Colon cancer Paternal Uncle      Social History Daniel Morse reports that he has quit smoking. His smoking use included Cigarettes. He has a 25 pack-year smoking history. He quit smokeless tobacco use about 3 years ago. Daniel Morse reports that he drinks alcohol.   Review of Systems CONSTITUTIONAL: No weight loss, fever, chills, weakness or fatigue.  HEENT: Eyes: No visual loss, blurred vision, double vision or yellow sclerae.No hearing loss, sneezing, congestion, runny nose or sore throat.  SKIN: No rash or itching.  CARDIOVASCULAR: per HPI RESPIRATORY:  No shortness of breath, cough or sputum.  GASTROINTESTINAL: per HPI GENITOURINARY: No burning on urination, no polyuria NEUROLOGICAL: No headache, dizziness, syncope, paralysis, ataxia, numbness or tingling in the extremities. No change in bowel or bladder control.  MUSCULOSKELETAL: No muscle, back pain, joint pain or stiffness.  LYMPHATICS: No enlarged nodes. No history of splenectomy.  PSYCHIATRIC: No history of depression or anxiety.  ENDOCRINOLOGIC: No reports of sweating, cold or heat intolerance. No polyuria or polydipsia.  Marland Kitchen   Physical Examination p 71 bp 102/68 Wt 214 lbs BMI 34 Gen: resting comfortably, no acute distress HEENT: no scleral icterus, pupils equal round and reactive, no palptable cervical adenopathy,  CV: irreg, no  m/r/g, no jVD, no carotid bruits Resp: Clear to auscultation bilaterally GI: abdomen is soft, non-tender, non-distended, normal bowel sounds, no hepatosplenomegaly MSK: extremities are warm, no edema.  Skin: warm, no rash Neuro:  no focal deficits Psych: appropriate affect   Diagnostic Studies 08/2010 Echo Study Conclusions  - Left ventricle: The cavity size was normal. Mild LVH with   disproportionate thickening of the septum. Systolic function was   mildly to moderately reduced. The estimated ejection fraction was   45%. Mild global hypokinesis; moderate hypokinesis of the mid and   distal septum. - Left atrium: The atrium was mildly to moderately dilated. - Right atrium: The atrium was mildly to moderately dilated. - Atrial septum: No defect or patent foramen ovale was identified. - Pulmonary arteries: PA peak pressure: 73mm Hg (S). Transthoracic echocardiography. M-mode, complete 2D, spectral   04/2013 Carotid US IMPRESSION:  1. No evidence of residual or recurrent stenosis post left carotid  endarterectomy.  2. Mild right carotid bifurcation plaque resulting in less than 50%  diameter stenosis. The exam does not exclude plaque ulceration or  embolization. Continued surveillance recommended.   Assessment and Plan   1. Afib - mildly elevated rates during recent ER visit in setting of epigastric pain, dilt $RemoveBe'30mg'LOOEQbtlh$  po bid started at that time - denies any palpitations, continue current meds. Continue coumadin, he is not interested in NOACs  2. Epigastric pain - symptoms are non-cardiac, he has outpatient follow up soon with Daniel Morse.    F/u 4 months  Daniel Morse, M.D., F.A.C.C.

## 2013-11-10 ENCOUNTER — Ambulatory Visit: Payer: Medicare Other | Admitting: Cardiology

## 2013-11-11 ENCOUNTER — Ambulatory Visit (INDEPENDENT_AMBULATORY_CARE_PROVIDER_SITE_OTHER): Payer: Medicare Other | Admitting: *Deleted

## 2013-11-11 DIAGNOSIS — G450 Vertebro-basilar artery syndrome: Secondary | ICD-10-CM

## 2013-11-11 DIAGNOSIS — Z5181 Encounter for therapeutic drug level monitoring: Secondary | ICD-10-CM

## 2013-11-11 DIAGNOSIS — Z7901 Long term (current) use of anticoagulants: Secondary | ICD-10-CM

## 2013-11-11 DIAGNOSIS — I679 Cerebrovascular disease, unspecified: Secondary | ICD-10-CM

## 2013-11-11 DIAGNOSIS — I4891 Unspecified atrial fibrillation: Secondary | ICD-10-CM

## 2013-11-11 DIAGNOSIS — IMO0002 Reserved for concepts with insufficient information to code with codable children: Secondary | ICD-10-CM

## 2013-11-11 LAB — POCT INR: INR: 1.5

## 2013-11-12 ENCOUNTER — Telehealth: Payer: Self-pay | Admitting: Cardiology

## 2013-11-12 MED ORDER — WARFARIN SODIUM 2.5 MG PO TABS: 2.5000 mg | ORAL_TABLET | Freq: Every day | ORAL | Status: DC

## 2013-11-12 NOTE — Telephone Encounter (Signed)
Received fax refill request  Rx #  Medication:  Warfarin Sodium Tablet Qty 90 day supply Sig:  Take 1 by mouth daily, except take 1 and 1/2 on Wednesdays Physician:  Bronson Ing

## 2013-11-25 ENCOUNTER — Ambulatory Visit (INDEPENDENT_AMBULATORY_CARE_PROVIDER_SITE_OTHER): Payer: Medicare Other | Admitting: *Deleted

## 2013-11-25 ENCOUNTER — Telehealth: Payer: Self-pay

## 2013-11-25 DIAGNOSIS — IMO0002 Reserved for concepts with insufficient information to code with codable children: Secondary | ICD-10-CM

## 2013-11-25 DIAGNOSIS — Z7901 Long term (current) use of anticoagulants: Secondary | ICD-10-CM

## 2013-11-25 DIAGNOSIS — Z5181 Encounter for therapeutic drug level monitoring: Secondary | ICD-10-CM

## 2013-11-25 DIAGNOSIS — I4891 Unspecified atrial fibrillation: Secondary | ICD-10-CM

## 2013-11-25 DIAGNOSIS — G450 Vertebro-basilar artery syndrome: Secondary | ICD-10-CM

## 2013-11-25 DIAGNOSIS — I679 Cerebrovascular disease, unspecified: Secondary | ICD-10-CM

## 2013-11-25 LAB — POCT INR: INR: 2.5

## 2013-11-25 NOTE — Telephone Encounter (Signed)
Pt dropped off 11 day recordings of BP.Placed on Dr.Branch's desk for review

## 2013-12-06 ENCOUNTER — Telehealth: Payer: Self-pay | Admitting: *Deleted

## 2013-12-06 MED ORDER — DILTIAZEM HCL 30 MG PO TABS: 30.0000 mg | ORAL_TABLET | Freq: Two times a day (BID) | ORAL | Status: DC

## 2013-12-06 NOTE — Telephone Encounter (Signed)
PT NEEDS RX DILTIAZEM CALLE DIN TO [facility] HE IS COMPLETELY OUT AND [facility] STATES THEY DID NOT RECEIVE IT.

## 2013-12-06 NOTE — Telephone Encounter (Signed)
Seen in ED 9/2 placed on cardizem 30 mg bid given 1 month supply no refills,I placed refill order with mail order and called in 1 month order to local pharmacy [facility]   Pt aware

## 2013-12-27 ENCOUNTER — Ambulatory Visit (INDEPENDENT_AMBULATORY_CARE_PROVIDER_SITE_OTHER): Payer: Medicare Other | Admitting: *Deleted

## 2013-12-27 DIAGNOSIS — Z5181 Encounter for therapeutic drug level monitoring: Secondary | ICD-10-CM

## 2013-12-27 DIAGNOSIS — I679 Cerebrovascular disease, unspecified: Secondary | ICD-10-CM

## 2013-12-27 DIAGNOSIS — I4891 Unspecified atrial fibrillation: Secondary | ICD-10-CM

## 2013-12-27 DIAGNOSIS — I4892 Unspecified atrial flutter: Secondary | ICD-10-CM

## 2013-12-27 DIAGNOSIS — Z7901 Long term (current) use of anticoagulants: Secondary | ICD-10-CM

## 2013-12-27 DIAGNOSIS — G450 Vertebro-basilar artery syndrome: Secondary | ICD-10-CM

## 2013-12-27 LAB — POCT INR: INR: 1.9

## 2014-01-05 ENCOUNTER — Telehealth: Payer: Self-pay | Admitting: *Deleted

## 2014-01-05 MED ORDER — DILTIAZEM HCL 30 MG PO TABS: 30.0000 mg | ORAL_TABLET | Freq: Two times a day (BID) | ORAL | Status: DC

## 2014-01-05 NOTE — Telephone Encounter (Signed)
PT NEEDS DILTIAZEM CALLED IN TO [facility] NOT [facility]IL ORDER

## 2014-01-19 ENCOUNTER — Ambulatory Visit (INDEPENDENT_AMBULATORY_CARE_PROVIDER_SITE_OTHER): Payer: Medicare Other | Admitting: *Deleted

## 2014-01-19 DIAGNOSIS — Z7901 Long term (current) use of anticoagulants: Secondary | ICD-10-CM

## 2014-01-19 DIAGNOSIS — Z5181 Encounter for therapeutic drug level monitoring: Secondary | ICD-10-CM

## 2014-01-19 DIAGNOSIS — G450 Vertebro-basilar artery syndrome: Secondary | ICD-10-CM

## 2014-01-19 DIAGNOSIS — I4892 Unspecified atrial flutter: Secondary | ICD-10-CM

## 2014-01-19 DIAGNOSIS — I679 Cerebrovascular disease, unspecified: Secondary | ICD-10-CM

## 2014-01-19 DIAGNOSIS — I4891 Unspecified atrial fibrillation: Secondary | ICD-10-CM

## 2014-01-19 LAB — POCT INR: INR: 1.8

## 2014-02-09 ENCOUNTER — Ambulatory Visit (INDEPENDENT_AMBULATORY_CARE_PROVIDER_SITE_OTHER): Payer: Medicare Other | Admitting: *Deleted

## 2014-02-09 DIAGNOSIS — Z5181 Encounter for therapeutic drug level monitoring: Secondary | ICD-10-CM

## 2014-02-09 DIAGNOSIS — I4892 Unspecified atrial flutter: Secondary | ICD-10-CM

## 2014-02-09 DIAGNOSIS — G450 Vertebro-basilar artery syndrome: Secondary | ICD-10-CM

## 2014-02-09 DIAGNOSIS — I679 Cerebrovascular disease, unspecified: Secondary | ICD-10-CM

## 2014-02-09 DIAGNOSIS — Z7901 Long term (current) use of anticoagulants: Secondary | ICD-10-CM

## 2014-02-09 DIAGNOSIS — I4891 Unspecified atrial fibrillation: Secondary | ICD-10-CM

## 2014-02-09 LAB — POCT INR: INR: 1.8

## 2014-02-09 MED ORDER — WARFARIN SODIUM 2.5 MG PO TABS: ORAL_TABLET | ORAL | Status: DC

## 2014-02-23 ENCOUNTER — Emergency Department (HOSPITAL_COMMUNITY): Payer: Medicare Other

## 2014-02-23 ENCOUNTER — Emergency Department (HOSPITAL_COMMUNITY)
Admission: EM | Admit: 2014-02-23 | Discharge: 2014-02-23 | Disposition: A | Discharge status: Home / Self Care | Payer: Medicare Other | Attending: Emergency Medicine | Admitting: Emergency Medicine

## 2014-02-23 ENCOUNTER — Encounter (HOSPITAL_COMMUNITY): Payer: Self-pay | Admitting: *Deleted

## 2014-02-23 DIAGNOSIS — Z86010 Personal history of colonic polyps: Secondary | ICD-10-CM | POA: Insufficient documentation

## 2014-02-23 DIAGNOSIS — Z79899 Other long term (current) drug therapy: Secondary | ICD-10-CM | POA: Insufficient documentation

## 2014-02-23 DIAGNOSIS — K5730 Diverticulosis of large intestine without perforation or abscess without bleeding: Secondary | ICD-10-CM | POA: Diagnosis not present

## 2014-02-23 DIAGNOSIS — Z8639 Personal history of other endocrine, nutritional and metabolic disease: Secondary | ICD-10-CM | POA: Insufficient documentation

## 2014-02-23 DIAGNOSIS — K219 Gastro-esophageal reflux disease without esophagitis: Secondary | ICD-10-CM | POA: Insufficient documentation

## 2014-02-23 DIAGNOSIS — Z8669 Personal history of other diseases of the nervous system and sense organs: Secondary | ICD-10-CM | POA: Insufficient documentation

## 2014-02-23 DIAGNOSIS — Z87891 Personal history of nicotine dependence: Secondary | ICD-10-CM | POA: Diagnosis not present

## 2014-02-23 DIAGNOSIS — Z7901 Long term (current) use of anticoagulants: Secondary | ICD-10-CM

## 2014-02-23 DIAGNOSIS — Z87442 Personal history of urinary calculi: Secondary | ICD-10-CM | POA: Insufficient documentation

## 2014-02-23 DIAGNOSIS — R1032 Left lower quadrant pain: Secondary | ICD-10-CM

## 2014-02-23 DIAGNOSIS — K5732 Diverticulitis of large intestine without perforation or abscess without bleeding: Secondary | ICD-10-CM

## 2014-02-23 LAB — URINALYSIS, ROUTINE W REFLEX MICROSCOPIC
GLUCOSE, UA: NEGATIVE mg/dL
Leukocytes, UA: NEGATIVE
Nitrite: NEGATIVE
Specific Gravity, Urine: 1.02 (ref 1.005–1.030)
Urobilinogen, UA: 2 mg/dL — ABNORMAL HIGH (ref 0.0–1.0)
pH: 6.5 (ref 5.0–8.0)

## 2014-02-23 LAB — CBC WITH DIFFERENTIAL/PLATELET
Basophils Absolute: 0 10*3/uL (ref 0.0–0.1)
Basophils Relative: 0 % (ref 0–1)
Eosinophils Absolute: 0.1 10*3/uL (ref 0.0–0.7)
Eosinophils Relative: 1 % (ref 0–5)
HCT: 47.6 % (ref 39.0–52.0)
Hemoglobin: 16.9 g/dL (ref 13.0–17.0)
LYMPHS ABS: 2.3 10*3/uL (ref 0.7–4.0)
LYMPHS PCT: 14 % (ref 12–46)
MCH: 33.5 pg (ref 26.0–34.0)
MCHC: 35.5 g/dL (ref 30.0–36.0)
MCV: 94.3 fL (ref 78.0–100.0)
Monocytes Absolute: 1.4 10*3/uL — ABNORMAL HIGH (ref 0.1–1.0)
Monocytes Relative: 8 % (ref 3–12)
NEUTROS ABS: 12.9 10*3/uL — AB (ref 1.7–7.7)
NEUTROS PCT: 77 % (ref 43–77)
PLATELETS: 146 10*3/uL — AB (ref 150–400)
RBC: 5.05 MIL/uL (ref 4.22–5.81)
RDW: 14.4 % (ref 11.5–15.5)
WBC: 16.6 10*3/uL — ABNORMAL HIGH (ref 4.0–10.5)

## 2014-02-23 LAB — COMPREHENSIVE METABOLIC PANEL
ALK PHOS: 80 U/L (ref 39–117)
ALT: 21 U/L (ref 0–53)
AST: 22 U/L (ref 0–37)
Albumin: 4 g/dL (ref 3.5–5.2)
Anion gap: 9 (ref 5–15)
BUN: 20 mg/dL (ref 6–23)
CHLORIDE: 98 meq/L (ref 96–112)
CO2: 26 mmol/L (ref 19–32)
Calcium: 9 mg/dL (ref 8.4–10.5)
Creatinine, Ser: 1.04 mg/dL (ref 0.50–1.35)
GFR calc non Af Amer: 70 mL/min — ABNORMAL LOW (ref >90)
GFR, EST AFRICAN AMERICAN: 81 mL/min — AB (ref >90)
GLUCOSE: 136 mg/dL — AB (ref 70–99)
POTASSIUM: 3.3 mmol/L — AB (ref 3.5–5.1)
SODIUM: 133 mmol/L — AB (ref 135–145)
Total Bilirubin: 2.4 mg/dL — ABNORMAL HIGH (ref 0.3–1.2)
Total Protein: 7.6 g/dL (ref 6.0–8.3)

## 2014-02-23 LAB — URINE MICROSCOPIC-ADD ON

## 2014-02-23 LAB — LACTIC ACID, PLASMA: Lactic Acid, Venous: 2.2 mmol/L (ref 0.5–2.2)

## 2014-02-23 LAB — PROTIME-INR
INR: 2 — ABNORMAL HIGH (ref 0.00–1.49)
PROTHROMBIN TIME: 22.9 s — AB (ref 11.6–15.2)

## 2014-02-23 LAB — LIPASE, BLOOD: Lipase: 22 U/L (ref 11–59)

## 2014-02-23 MED ORDER — METRONIDAZOLE IN NACL 5-0.79 MG/ML-% IV SOLN
500.0000 mg | Freq: Once | INTRAVENOUS | Status: AC
  Administered 2014-02-23: 500 mg via INTRAVENOUS
  Filled 2014-02-23: qty 100

## 2014-02-23 MED ORDER — CIPROFLOXACIN IN D5W 400 MG/200ML IV SOLN
400.0000 mg | Freq: Once | INTRAVENOUS | Status: AC
  Administered 2014-02-23: 400 mg via INTRAVENOUS
  Filled 2014-02-23: qty 200

## 2014-02-23 MED ORDER — IOHEXOL 300 MG/ML  SOLN
25.0000 mL | Freq: Once | INTRAMUSCULAR | Status: AC | PRN
  Administered 2014-02-23: 25 mL via ORAL

## 2014-02-23 MED ORDER — OXYCODONE-ACETAMINOPHEN 5-325 MG PO TABS: 1.0000 | ORAL_TABLET | ORAL | Status: DC | PRN

## 2014-02-23 MED ORDER — METRONIDAZOLE 500 MG PO TABS: 500.0000 mg | ORAL_TABLET | Freq: Three times a day (TID) | ORAL | Status: DC

## 2014-02-23 MED ORDER — CIPROFLOXACIN HCL 500 MG PO TABS: 500.0000 mg | ORAL_TABLET | Freq: Two times a day (BID) | ORAL | Status: DC

## 2014-02-23 MED ORDER — IOHEXOL 300 MG/ML  SOLN
100.0000 mL | Freq: Once | INTRAMUSCULAR | Status: AC | PRN
  Administered 2014-02-23: 100 mL via INTRAVENOUS

## 2014-02-23 NOTE — Discharge Instructions (Signed)
You are being given prescriptions for antibiotics for your diverticulitis. These will increase the effectiveness of your warfarin and could lead to a dangerous degree of blood thinning. Please call your cardiologist to discuss what to do about your warfarin while you're on the antibiotics. Please return to the emergency department if he develops a high fever or worsening abdominal pain.  Diverticulitis Diverticulitis is inflammation or infection of small pouches in your colon that form when you have a condition called diverticulosis. The pouches in your colon are called diverticula. Your colon, or large intestine, is where water is absorbed and stool is formed. Complications of diverticulitis can include:  Bleeding.  Severe infection.  Severe pain.  Perforation of your colon.  Obstruction of your colon. CAUSES  Diverticulitis is caused by bacteria. Diverticulitis happens when stool becomes trapped in diverticula. This allows bacteria to grow in the diverticula, which can lead to inflammation and infection. RISK FACTORS People with diverticulosis are at risk for diverticulitis. Eating a diet that does not include enough fiber from fruits and vegetables may make diverticulitis more likely to develop. SYMPTOMS  Symptoms of diverticulitis may include:  Abdominal pain and tenderness. The pain is normally located on the left side of the abdomen, but may occur in other areas.  Fever and chills.  Bloating.  Cramping.  Nausea.  Vomiting.  Constipation.  Diarrhea.  Blood in your stool. DIAGNOSIS  Your health care provider will ask you about your medical history and do a physical exam. You may need to have tests done because many medical conditions can cause the same symptoms as diverticulitis. Tests may include:  Blood tests.  Urine tests.  Imaging tests of the abdomen, including X-rays and CT scans. When your condition is under control, your health care provider may recommend  that you have a colonoscopy. A colonoscopy can show how severe your diverticula are and whether something else is causing your symptoms. TREATMENT  Most cases of diverticulitis are mild and can be treated at home. Treatment may include:  Taking over-the-counter pain medicines.  Following a clear liquid diet.  Taking antibiotic medicines by mouth for 7-10 days. More severe cases may be treated at a hospital. Treatment may include:  Not eating or drinking.  Taking prescription pain medicine.  Receiving antibiotic medicines through an IV tube.  Receiving fluids and nutrition through an IV tube.  Surgery. HOME CARE INSTRUCTIONS   Follow your health care provider's instructions carefully.  Follow a full liquid diet or other diet as directed by your health care provider. After your symptoms improve, your health care provider may tell you to change your diet. He or she may recommend you eat a high-fiber diet. Fruits and vegetables are good sources of fiber. Fiber makes it easier to pass stool.  Take fiber supplements or probiotics as directed by your health care provider.  Only take medicines as directed by your health care provider.  Keep all your follow-up appointments. SEEK MEDICAL CARE IF:   Your pain does not improve.  You have a hard time eating food.  Your bowel movements do not return to normal. SEEK IMMEDIATE MEDICAL CARE IF:   Your pain becomes worse.  Your symptoms do not get better.  Your symptoms suddenly get worse.  You have a fever.  You have repeated vomiting.  You have bloody or black, tarry stools. MAKE SURE YOU:   Understand these instructions.  Will watch your condition.  Will get help right away if you are not doing  well or get worse. Document Released: 11/28/2004 Document Revised: 02/23/2013 Document Reviewed: 01/13/2013 [facility] Patient Information 2015 [place], [place]. This information is not intended to replace advice given to you by your  health care provider. Make sure you discuss any questions you have with your health care provider.  Ciprofloxacin tablets What is this medicine? CIPROFLOXACIN (sip roe FLOX a sin) is a quinolone antibiotic. It is used to treat certain kinds of bacterial infections. It will not work for colds, flu, or other viral infections. This medicine may be used for other purposes; ask your health care provider or pharmacist if you have questions. COMMON BRAND NAME(S): Cipro What should I tell my health care provider before I take this medicine? They need to know if you have any of these conditions: -bone problems -cerebral disease -joint problems -irregular heartbeat -kidney disease -liver disease -myasthenia gravis -seizure disorder -tendon problems -an unusual or allergic reaction to ciprofloxacin, other antibiotics or medicines, foods, dyes, or preservatives -pregnant or trying to get pregnant -breast-feeding How should I use this medicine? Take this medicine by mouth with a glass of water. Follow the directions on the prescription label. Take your medicine at regular intervals. Do not take your medicine more often than directed. Take all of your medicine as directed even if you think your are better. Do not skip doses or stop your medicine early. You can take this medicine with food or on an empty stomach. It can be taken with a meal that contains dairy or calcium, but do not take it alone with a dairy product, like milk or yogurt or calcium-fortified juice. A special MedGuide will be given to you by the pharmacist with each prescription and refill. Be sure to read this information carefully each time. Talk to your pediatrician regarding the use of this medicine in children. Special care may be needed. Overdosage: If you think you have taken too much of this medicine contact a poison control center or emergency room at once. NOTE: This medicine is only for you. Do not share this medicine with  others. What if I miss a dose? If you miss a dose, take it as soon as you can. If it is almost time for your next dose, take only that dose. Do not take double or extra doses. What may interact with this medicine? Do not take this medicine with any of the following medications: -cisapride -droperidol -terfenadine -tizanidine This medicine may also interact with the following medications: -antacids -birth control pills -caffeine -cyclosporin -didanosine (ddI) buffered tablets or powder -medicines for diabetes -medicines for inflammation like ibuprofen, naproxen -methotrexate -multivitamins -omeprazole -phenytoin -probenecid -sucralfate -theophylline -warfarin This list may not describe all possible interactions. Give your health care provider a list of all the medicines, herbs, non-prescription drugs, or dietary supplements you use. Also tell them if you smoke, drink alcohol, or use illegal drugs. Some items may interact with your medicine. What should I watch for while using this medicine? Tell your doctor or health care professional if your symptoms do not improve. Do not treat diarrhea with over the counter products. Contact your doctor if you have diarrhea that lasts more than 2 days or if it is severe and watery. You may get drowsy or dizzy. Do not drive, use machinery, or do anything that needs mental alertness until you know how this medicine affects you. Do not stand or sit up quickly, especially if you are an older patient. This reduces the risk of dizzy or fainting spells. This medicine  can make you more sensitive to the sun. Keep out of the sun. If you cannot avoid being in the sun, wear protective clothing and use sunscreen. Do not use sun lamps or tanning beds/booths. Avoid antacids, aluminum, calcium, iron, magnesium, and zinc products for 6 hours before and 2 hours after taking a dose of this medicine. What side effects may I notice from receiving this medicine? Side  effects that you should report to your doctor or health care professional as soon as possible: - allergic reactions like skin rash, itching or hives, swelling of the face, lips, or tongue - breathing problems - confusion, nightmares or hallucinations - feeling faint or lightheaded, falls - irregular heartbeat - joint, muscle or tendon pain or swelling - pain or trouble passing urine -persistent headache with or without blurred vision - redness, blistering, peeling or loosening of the skin, including inside the mouth - seizure - unusual pain, numbness, tingling, or weakness Side effects that usually do not require medical attention (report to your doctor or health care professional if they continue or are bothersome): - diarrhea - nausea or stomach upset - white patches or sores in the mouth This list may not describe all possible side effects. Call your doctor for medical advice about side effects. You may report side effects to FDA at 1-800-FDA-1088. Where should I keep my medicine? Keep out of the reach of children. Store at room temperature below 30 degrees C (86 degrees F). Keep container tightly closed. Throw away any unused medicine after the expiration date. NOTE: This sheet is a summary. It may not cover all possible information. If you have questions about this medicine, talk to your doctor, pharmacist, or health care provider.  2015, [employer]. (2012-09-24 16:10:46)  Metronidazole tablets or capsules What is this medicine? METRONIDAZOLE (me troe NI da zole) is an antiinfective. It is used to treat certain kinds of bacterial and protozoal infections. It will not work for colds, flu, or other viral infections. This medicine may be used for other purposes; ask your health care provider or pharmacist if you have questions. COMMON BRAND NAME(S): Flagyl What should I tell my health care provider before I take this medicine? They need to know if you have any  of these conditions: -anemia or other blood disorders -disease of the nervous system -fungal or yeast infection -if you drink alcohol containing drinks -liver disease -seizures -an unusual or allergic reaction to metronidazole, or other medicines, foods, dyes, or preservatives -pregnant or trying to get pregnant -breast-feeding How should I use this medicine? Take this medicine by mouth with a full glass of water. Follow the directions on the prescription label. Take your medicine at regular intervals. Do not take your medicine more often than directed. Take all of your medicine as directed even if you think you are better. Do not skip doses or stop your medicine early. Talk to your pediatrician regarding the use of this medicine in children. Special care may be needed. Overdosage: If you think you have taken too much of this medicine contact a poison control center or emergency room at once. NOTE: This medicine is only for you. Do not share this medicine with others. What if I miss a dose? If you miss a dose, take it as soon as you can. If it is almost time for your next dose, take only that dose. Do not take double or extra doses. What may interact with this medicine? Do not take this medicine with any of the  following medications: -alcohol or any product that contains alcohol -amprenavir oral solution -cisapride -disulfiram -dofetilide -dronedarone -paclitaxel injection -pimozide -ritonavir oral solution -sertraline oral solution -sulfamethoxazole-trimethoprim injection -thioridazine -ziprasidone This medicine may also interact with the following medications: -birth control pills -cimetidine -lithium -other medicines that prolong the QT interval (cause an abnormal heart rhythm) -phenobarbital -phenytoin -warfarin This list may not describe all possible interactions. Give your health care provider a list of all the medicines, herbs, non-prescription drugs, or dietary  supplements you use. Also tell them if you smoke, drink alcohol, or use illegal drugs. Some items may interact with your medicine. What should I watch for while using this medicine? Tell your doctor or health care professional if your symptoms do not improve or if they get worse. You may get drowsy or dizzy. Do not drive, use machinery, or do anything that needs mental alertness until you know how this medicine affects you. Do not stand or sit up quickly, especially if you are an older patient. This reduces the risk of dizzy or fainting spells. Avoid alcoholic drinks while you are taking this medicine and for three days afterward. Alcohol may make you feel dizzy, sick, or flushed. If you are being treated for a sexually transmitted disease, avoid sexual contact until you have finished your treatment. Your sexual partner may also need treatment. What side effects may I notice from receiving this medicine? Side effects that you should report to your doctor or health care professional as soon as possible: -allergic reactions like skin rash or hives, swelling of the face, lips, or tongue -confusion, clumsiness -difficulty speaking -discolored or sore mouth -dizziness -fever, infection -numbness, tingling, pain or weakness in the hands or feet -trouble passing urine or change in the amount of urine -redness, blistering, peeling or loosening of the skin, including inside the mouth -seizures -unusually weak or tired -vaginal irritation, dryness, or discharge Side effects that usually do not require medical attention (report to your doctor or health care professional if they continue or are bothersome): -diarrhea -headache -irritability -metallic taste -nausea -stomach pain or cramps -trouble sleeping This list may not describe all possible side effects. Call your doctor for medical advice about side effects. You may report side effects to FDA at 1-800-FDA-1088. Where should I keep my  medicine? Keep out of the reach of children. Store at room temperature below 25 degrees C (77 degrees F). Protect from light. Keep container tightly closed. Throw away any unused medicine after the expiration date. NOTE: This sheet is a summary. It may not cover all possible information. If you have questions about this medicine, talk to your doctor, pharmacist, or health care provider.  2015, [employer]. (2012-09-25 14:08:39)  Acetaminophen; Oxycodone tablets What is this medicine? ACETAMINOPHEN; OXYCODONE (a set a MEE noe fen; ox i KOE done) is a pain reliever. It is used to treat mild to moderate pain. This medicine may be used for other purposes; ask your health care provider or pharmacist if you have questions. COMMON BRAND NAME(S): Endocet, Magnacet, Narvox, Percocet, Perloxx, Primalev, Primlev, Roxicet, Xolox What should I tell my health care provider before I take this medicine? They need to know if you have any of these conditions: -brain tumor -Crohn's disease, inflammatory bowel disease, or ulcerative colitis -drug abuse or addiction -head injury -heart or circulation problems -if you often drink alcohol -kidney disease or problems going to the bathroom -liver disease -lung disease, asthma, or breathing problems -an unusual or allergic reaction to acetaminophen, oxycodone, other  opioid analgesics, other medicines, foods, dyes, or preservatives -pregnant or trying to get pregnant -breast-feeding How should I use this medicine? Take this medicine by mouth with a full glass of water. Follow the directions on the prescription label. Take your medicine at regular intervals. Do not take your medicine more often than directed. Talk to your pediatrician regarding the use of this medicine in children. Special care may be needed. Patients over 23 years old may have a stronger reaction and need a smaller dose. Overdosage: If you think you have taken too much of this medicine  contact a poison control center or emergency room at once. NOTE: This medicine is only for you. Do not share this medicine with others. What if I miss a dose? If you miss a dose, take it as soon as you can. If it is almost time for your next dose, take only that dose. Do not take double or extra doses. What may interact with this medicine? -alcohol -antihistamines -barbiturates like amobarbital, butalbital, butabarbital, methohexital, pentobarbital, phenobarbital, thiopental, and secobarbital -benztropine -drugs for bladder problems like solifenacin, trospium, oxybutynin, tolterodine, hyoscyamine, and methscopolamine -drugs for breathing problems like ipratropium and tiotropium -drugs for certain stomach or intestine problems like propantheline, homatropine methylbromide, glycopyrrolate, atropine, belladonna, and dicyclomine -general anesthetics like etomidate, ketamine, nitrous oxide, propofol, desflurane, enflurane, halothane, isoflurane, and sevoflurane -medicines for depression, anxiety, or psychotic disturbances -medicines for sleep -muscle relaxants -naltrexone -narcotic medicines (opiates) for pain -phenothiazines like perphenazine, thioridazine, chlorpromazine, mesoridazine, fluphenazine, prochlorperazine, promazine, and trifluoperazine -scopolamine -tramadol -trihexyphenidyl This list may not describe all possible interactions. Give your health care provider a list of all the medicines, herbs, non-prescription drugs, or dietary supplements you use. Also tell them if you smoke, drink alcohol, or use illegal drugs. Some items may interact with your medicine. What should I watch for while using this medicine? Tell your doctor or health care professional if your pain does not go away, if it gets worse, or if you have new or a different type of pain. You may develop tolerance to the medicine. Tolerance means that you will need a higher dose of the medication for pain relief. Tolerance is  normal and is expected if you take this medicine for a long time. Do not suddenly stop taking your medicine because you may develop a severe reaction. Your body becomes used to the medicine. This does NOT mean you are addicted. Addiction is a behavior related to getting and using a drug for a non-medical reason. If you have pain, you have a medical reason to take pain medicine. Your doctor will tell you how much medicine to take. If your doctor wants you to stop the medicine, the dose will be slowly lowered over time to avoid any side effects. You may get drowsy or dizzy. Do not drive, use machinery, or do anything that needs mental alertness until you know how this medicine affects you. Do not stand or sit up quickly, especially if you are an older patient. This reduces the risk of dizzy or fainting spells. Alcohol may interfere with the effect of this medicine. Avoid alcoholic drinks. There are different types of narcotic medicines (opiates) for pain. If you take more than one type at the same time, you may have more side effects. Give your health care provider a list of all medicines you use. Your doctor will tell you how much medicine to take. Do not take more medicine than directed. Call emergency for help if you have problems breathing. The  medicine will cause constipation. Try to have a bowel movement at least every 2 to 3 days. If you do not have a bowel movement for 3 days, call your doctor or health care professional. Do not take Tylenol (acetaminophen) or medicines that have acetaminophen with this medicine. Too much acetaminophen can be very dangerous. Many nonprescription medicines contain acetaminophen. Always read the labels carefully to avoid taking more acetaminophen. What side effects may I notice from receiving this medicine? Side effects that you should report to your doctor or health care professional as soon as possible: -allergic reactions like skin rash, itching or hives, swelling of  the face, lips, or tongue -breathing difficulties, wheezing -confusion -light headedness or fainting spells -severe stomach pain -unusually weak or tired -yellowing of the skin or the whites of the eyes Side effects that usually do not require medical attention (report to your doctor or health care professional if they continue or are bothersome): -dizziness -drowsiness -nausea -vomiting This list may not describe all possible side effects. Call your doctor for medical advice about side effects. You may report side effects to FDA at 1-800-FDA-1088. Where should I keep my medicine? Keep out of the reach of children. This medicine can be abused. Keep your medicine in a safe place to protect it from theft. Do not share this medicine with anyone. Selling or giving away this medicine is dangerous and against the law. Store at room temperature between 20 and 25 degrees C (68 and 77 degrees F). Keep container tightly closed. Protect from light. This medicine may cause accidental overdose and death if it is taken by other adults, children, or pets. Flush any unused medicine down the toilet to reduce the chance of harm. Do not use the medicine after the expiration date. NOTE: This sheet is a summary. It may not cover all possible information. If you have questions about this medicine, talk to your doctor, pharmacist, or health care provider.  2015, [employer] Standard. (2012-10-12 13:17:35)

## 2014-02-23 NOTE — ED Provider Notes (Signed)
CSN: [id number]     Arrival date & time 02/23/14  0039 History   First MD Initiated Contact with Patient 02/23/14 0133     Chief Complaint  Patient presents with  . Abdominal Pain     (Consider location/radiation/quality/duration/timing/severity/associated sxs/prior Treatment) Patient is a 73 y.o. male presenting with abdominal pain. The history is provided by the patient.  Abdominal Pain He noted onset on December 21 of pain in the left lower abdomen without radiation. Pain is a pulling sensation and is worse with movement. It does not hurt him much of his sitting still. He rates pain at 8/10. There is associated nausea but no vomiting. He denies fever, chills, sweats. He denies any constipation or diarrhea or urinary difficulty. Pain is not affected by bowel movement or urinating. Of note, he is anticoagulated with warfarin because of atrial fibrillation. He denies any recent trauma. Recent INRs have been subtherapeutic with the last one having been checked about 2 weeks ago.  Past Medical History  Diagnosis Date  . Hyperlipidemia     Metabolic syndrome with increased triglycerides  . Hypertension   . GERD (gastroesophageal reflux disease)     h/o esophagitis  . Atrial flutter 06/2010  . Nephrolithiasis   . Chest pain     negative stress nuclear in 11/2002  . Macular degeneration     legally blind  . Tobacco abuse     50 pack years; 1/2 pack per day  . Cerebrovascular disease 2008    Left CEA  . Chronic anticoagulation   . History of colonic polyps     Colonoscopy and polypectomy in 03/2011   Past Surgical History  Procedure Laterality Date  . Cystoscopy w/ ureteral stent placement      and stone extraction  . Carotid endarterectomy  2008    Left  . Colonoscopy  04/04/2011    Rogene Houston, MD;  multiple polyps ablated   Family History  Problem Relation Age of Onset  . Colon cancer Paternal Uncle    History  Substance Use Topics  . Smoking status: Former Smoker --  0.50 packs/day for 50 years    Types: Cigarettes    Start date: 11/06/1978    Quit date: 11/05/2009  . Smokeless tobacco: Former Systems developer    Quit date: 06/01/2010  . Alcohol Use: Yes     Comment: daily    Review of Systems  Gastrointestinal: Positive for abdominal pain.  All other systems reviewed and are negative.     Allergies  Simvastatin  Home Medications   Prior to Admission medications   Medication Sig Start Date End Date Taking? Authorizing Provider  atenolol-chlorthalidone (TENORETIC) 100-25 MG per tablet Take 1 tablet by mouth daily. 04/14/13   Herminio Commons, MD  diltiazem (CARDIZEM) 30 MG tablet Take 1 tablet (30 mg total) by mouth 2 (two) times daily. 01/05/14   Arnoldo Lenis, MD  omeprazole (PRILOSEC) 20 MG capsule Take 20 mg by mouth 2 (two) times daily.     Historical Provider, MD  potassium chloride SA (K-DUR,KLOR-CON) 20 MEQ tablet Take 20 mEq by mouth 2 (two) times daily.    Historical Provider, MD  Specialty Vitamins Products (ICAPS LUTEIN-ZEAXANTHIN PO) Take 1 tablet by mouth daily.    Historical Provider, MD  warfarin (COUMADIN) 2.5 MG tablet Take 1 tablet daily except 1 1/2 tablets on M,W,F 02/09/14   Arnoldo Lenis, MD   BP 126/79 mmHg  Pulse 107  Temp(Src) 99.1 F (37.3 C) (  Oral)  Resp 16  Ht $R'5\' 7"'TU$  (1.702 m)  Wt 220 lb (99.791 kg)  BMI 34.45 kg/m2  SpO2 95% Physical Exam  Nursing note and vitals reviewed.  73 year old male, resting comfortably and in no acute distress. Vital signs are significant for tachycardia. Oxygen saturation is 95%, which is normal. Head is normocephalic and atraumatic. PERRLA, EOMI. Oropharynx is clear. Neck is nontender and supple without adenopathy or JVD. Back is nontender and there is no CVA tenderness. Lungs are clear without rales, wheezes, or rhonchi. Chest is nontender. Heart is tachycardic and irregular without murmur. Abdomen is soft, flat, with marked tenderness in the left lower abdomen. Tenderness is  well demarcated but no masses palpable. There is no rebound or guarding. There are no masses or hepatosplenomegaly and peristalsis is hypoactive. Extremities have no cyanosis or edema, full range of motion is present. Skin is warm and dry without rash. Neurologic: Mental status is normal, cranial nerves are intact, there are no motor or sensory deficits.  ED Course  Procedures (including critical care time) Labs Review Results for orders placed or performed during the hospital encounter of 02/23/14  CBC with Differential  Result Value Ref Range   WBC 16.6 (H) 4.0 - 10.5 K/uL   RBC 5.05 4.22 - 5.81 MIL/uL   Hemoglobin 16.9 13.0 - 17.0 g/dL   HCT 47.6 39.0 - 52.0 %   MCV 94.3 78.0 - 100.0 fL   MCH 33.5 26.0 - 34.0 pg   MCHC 35.5 30.0 - 36.0 g/dL   RDW 14.4 11.5 - 15.5 %   Platelets 146 (L) 150 - 400 K/uL   Neutrophils Relative % 77 43 - 77 %   Neutro Abs 12.9 (H) 1.7 - 7.7 K/uL   Lymphocytes Relative 14 12 - 46 %   Lymphs Abs 2.3 0.7 - 4.0 K/uL   Monocytes Relative 8 3 - 12 %   Monocytes Absolute 1.4 (H) 0.1 - 1.0 K/uL   Eosinophils Relative 1 0 - 5 %   Eosinophils Absolute 0.1 0.0 - 0.7 K/uL   Basophils Relative 0 0 - 1 %   Basophils Absolute 0.0 0.0 - 0.1 K/uL  Comprehensive metabolic panel  Result Value Ref Range   Sodium 133 (L) 135 - 145 mmol/L   Potassium 3.3 (L) 3.5 - 5.1 mmol/L   Chloride 98 96 - 112 mEq/L   CO2 26 19 - 32 mmol/L   Glucose, Bld 136 (H) 70 - 99 mg/dL   BUN 20 6 - 23 mg/dL   Creatinine, Ser 1.04 0.50 - 1.35 mg/dL   Calcium 9.0 8.4 - 10.5 mg/dL   Total Protein 7.6 6.0 - 8.3 g/dL   Albumin 4.0 3.5 - 5.2 g/dL   AST 22 0 - 37 U/L   ALT 21 0 - 53 U/L   Alkaline Phosphatase 80 39 - 117 U/L   Total Bilirubin 2.4 (H) 0.3 - 1.2 mg/dL   GFR calc non Af Amer 70 (L) >90 mL/min   GFR calc Af Amer 81 (L) >90 mL/min   Anion gap 9 5 - 15  Lipase, blood  Result Value Ref Range   Lipase 22 11 - 59 U/L  Urinalysis, Routine w reflex microscopic  Result Value Ref  Range   Color, Urine AMBER (A) YELLOW   APPearance CLEAR CLEAR   Specific Gravity, Urine 1.020 1.005 - 1.030   pH 6.5 5.0 - 8.0   Glucose, UA NEGATIVE NEGATIVE mg/dL   Hgb urine dipstick  TRACE (A) NEGATIVE   Bilirubin Urine SMALL (A) NEGATIVE   Ketones, ur TRACE (A) NEGATIVE mg/dL   Protein, ur TRACE (A) NEGATIVE mg/dL   Urobilinogen, UA 2.0 (H) 0.0 - 1.0 mg/dL   Nitrite NEGATIVE NEGATIVE   Leukocytes, UA NEGATIVE NEGATIVE  Protime-INR  Result Value Ref Range   Prothrombin Time 22.9 (H) 11.6 - 15.2 seconds   INR 2.00 (H) 0.00 - 1.49  Lactic acid, plasma  Result Value Ref Range   Lactic Acid, Venous 2.2 0.5 - 2.2 mmol/L  Urine microscopic-add on  Result Value Ref Range   Squamous Epithelial / LPF FEW (A) RARE   WBC, UA 0-2 <3 WBC/hpf   RBC / HPF 0-2 <3 RBC/hpf   Bacteria, UA MANY (A) RARE   Urine-Other MUCOUS PRESENT    Imaging Review Ct Abdomen Pelvis W Contrast  02/23/2014   CLINICAL DATA:  Acute onset of left lower quadrant abdominal pain. Initial encounter.  EXAM: CT ABDOMEN AND PELVIS WITH CONTRAST  TECHNIQUE: Multidetector CT imaging of the abdomen and pelvis was performed using the standard protocol following bolus administration of intravenous contrast.  CONTRAST:  51mL OMNIPAQUE IOHEXOL 300 MG/ML SOLN, 154mL OMNIPAQUE IOHEXOL 300 MG/ML SOLN  COMPARISON:  CT of the abdomen and pelvis performed 01/04/2004, and renal ultrasound performed 08/09/2004  FINDINGS: The visualized lung bases are clear.  The liver and spleen are unremarkable in appearance. The gallbladder is within normal limits. The pancreas and adrenal glands are unremarkable.  Nonspecific perinephric stranding noted bilaterally. There is mild scarring at the lower pole of the left kidney. The kidneys are otherwise unremarkable. There is no evidence of hydronephrosis. No renal or ureteral stones are seen.  No free fluid is identified. The small bowel is unremarkable in appearance. The stomach is within normal limits. No  acute vascular abnormalities are seen. Mild scattered calcification is seen along the abdominal aorta and its branches.  The appendix is not definitely seen. There is no evidence of appendicitis.  Diffuse soft tissue inflammation is noted at the distal descending colon, with associated wall thickening and inflamed diverticula, compatible with acute diverticulitis. No significant free fluid is seen. There is no evidence of perforation or abscess formation. Scattered diverticulosis is noted involving the distal descending and proximal sigmoid colon.  The bladder is mildly distended and grossly unremarkable. The prostate is borderline normal in size. No inguinal lymphadenopathy is seen.  No acute osseous abnormalities are identified.  IMPRESSION: 1. Acute diverticulitis noted at the distal descending colon, with associated colonic wall thickening and inflamed diverticula. No evidence of perforation or abscess formation. 2. Scattered diverticulosis involving the distal descending and proximal sigmoid colon. 3. Mild scarring at the lower pole of the left kidney. 4. Mild scattered calcification along the abdominal aorta and its branches.   Electronically Signed   By: Garald Balding M.D.   On: 02/23/2014 03:37   Images viewed by me.   MDM   Final diagnoses:  LLQ abdominal pain  Diverticulitis of colon without hemorrhage  Anticoagulated on warfarin    Left lower quadrant pain of uncertain cause. He is on warfarin, sewed need to consider spontaneous hematoma. WBC is some moderately elevated and he will be sent for CT scan to for further evaluation. Last INR was done on December 9 and was 1.8.  CT scan shows acute diverticulitis without complications. Because of leukocytosis and underlying atrial fibrillation, I did suggest hospitalization but patient stated that he wished to go home if at all possible.  He does not appear toxic and lactic acid level is normal so was felt that this would be a reasonable thing to  try. He is given initial dose of ciprofloxacin and metronidazole in the ED and sent home with prescriptions for same. He is advised that they will increase the effectiveness of his warfarin and the dose may need to be adjusted while on the antibiotics. He is to contact us cardiologist for instructions regarding warfarin dosing. His follow-up with his PCP for recheck in 24 hours and is to return for worsening symptoms.  Delora Fuel, MD 25/74/93 [id number]

## 2014-02-23 NOTE — ED Notes (Signed)
Pt refused IV. Stating that he only wanted a butterfly needle in his hand.   Stating that he needs an x-ray.  Explained to pt that the physician may order a CT scan, and if it's ordered with contrast, then the IV needs to be higher in the arm.  Pt only consented to lab work till he is able to see physician.  Explained delay and reassured pt that physician will be in to see him as much as possible.

## 2014-02-23 NOTE — ED Notes (Addendum)
Pt reporting pain in LLQ since yesterday morning.  Reporting nausea, denies vomiting or diarrhea.  Reports pain is only experienced with movement.  Describes it is "it feels like something is pulling".

## 2014-02-28 ENCOUNTER — Ambulatory Visit (INDEPENDENT_AMBULATORY_CARE_PROVIDER_SITE_OTHER): Payer: Medicare Other | Admitting: *Deleted

## 2014-02-28 DIAGNOSIS — I679 Cerebrovascular disease, unspecified: Secondary | ICD-10-CM

## 2014-02-28 DIAGNOSIS — Z5181 Encounter for therapeutic drug level monitoring: Secondary | ICD-10-CM

## 2014-02-28 DIAGNOSIS — I4892 Unspecified atrial flutter: Secondary | ICD-10-CM

## 2014-02-28 DIAGNOSIS — G450 Vertebro-basilar artery syndrome: Secondary | ICD-10-CM

## 2014-02-28 DIAGNOSIS — I4891 Unspecified atrial fibrillation: Secondary | ICD-10-CM

## 2014-02-28 DIAGNOSIS — Z7901 Long term (current) use of anticoagulants: Secondary | ICD-10-CM

## 2014-02-28 LAB — POCT INR: INR: 1.5

## 2014-03-07 ENCOUNTER — Ambulatory Visit (INDEPENDENT_AMBULATORY_CARE_PROVIDER_SITE_OTHER): Payer: Medicare Other | Admitting: *Deleted

## 2014-03-07 DIAGNOSIS — Z5181 Encounter for therapeutic drug level monitoring: Secondary | ICD-10-CM

## 2014-03-07 DIAGNOSIS — I4892 Unspecified atrial flutter: Secondary | ICD-10-CM

## 2014-03-07 DIAGNOSIS — Z7901 Long term (current) use of anticoagulants: Secondary | ICD-10-CM

## 2014-03-07 DIAGNOSIS — I679 Cerebrovascular disease, unspecified: Secondary | ICD-10-CM

## 2014-03-07 DIAGNOSIS — I4891 Unspecified atrial fibrillation: Secondary | ICD-10-CM

## 2014-03-07 DIAGNOSIS — G450 Vertebro-basilar artery syndrome: Secondary | ICD-10-CM

## 2014-03-07 LAB — POCT INR: INR: 1.8

## 2014-03-09 ENCOUNTER — Encounter (INDEPENDENT_AMBULATORY_CARE_PROVIDER_SITE_OTHER): Payer: Self-pay | Admitting: Internal Medicine

## 2014-03-09 ENCOUNTER — Ambulatory Visit (INDEPENDENT_AMBULATORY_CARE_PROVIDER_SITE_OTHER): Payer: Medicare Other | Admitting: Internal Medicine

## 2014-03-09 VITALS — BP 104/68 | HR 64 | Temp 97.3°F | Ht 67.0 in | Wt 217.1 lb

## 2014-03-09 DIAGNOSIS — R197 Diarrhea, unspecified: Secondary | ICD-10-CM

## 2014-03-09 DIAGNOSIS — K5732 Diverticulitis of large intestine without perforation or abscess without bleeding: Secondary | ICD-10-CM

## 2014-03-09 DIAGNOSIS — K219 Gastro-esophageal reflux disease without esophagitis: Secondary | ICD-10-CM

## 2014-03-09 MED ORDER — ESOMEPRAZOLE MAGNESIUM 20 MG PO CPDR: 20.0000 mg | DELAYED_RELEASE_CAPSULE | Freq: Every day | ORAL | Status: DC

## 2014-03-09 NOTE — Progress Notes (Signed)
   Subjective:    Patient ID: Daniel Morse, male    DOB: 25-May-1940, 74 y.o.   MRN: [id number]  HPI Here today after recent visit to the ED (02/23/2014)  for abdominal pain. Noted on CT to have diverticulitis at the distal descending colon. There was no evidence of perforation or abscess formation. Started on Cipro and Flagyl x 10 days. Patient declined admission to hospital. There is no prior hx of diverticulitis.  He tells me he is doing great. He denies any abdominal pain. He does have some diarrhea. Diarrhea started a couple of days before he went to the ED. He says his stool are not liquid but soft. He is having about 3-4 stools a day. Appetite is good. No weight loss.  No melena or BRRB.  Chronic Coumadin therapy for atrial fib.     Last colonoscopy was in 2013:  Impression:  Examination performed to cecum. Few small diverticula and sigmoid colon. Small polyp ablated via cold biopsy from ascending colon. Three small polyps ablated via cold biopsy from splenic flexure and submitted in one container. Biopsy:  Had 4 small polyps removed they're non-adenomatous. Next colonoscopy in 5 years.    02/23/2014 CT abdomen/pelvis with CM:  IMPRESSION: 1. Acute diverticulitis noted at the distal descending colon, with associated colonic wall thickening and inflamed diverticula. No evidence of perforation or abscess formation. 2. Scattered diverticulosis involving the distal descending and proximal sigmoid colon. 3. Mild scarring at the lower pole of the left kidney.  4. Mild scattered calcification along the abdominal aorta and its  CBC    Component Value Date/Time   WBC 16.6* 02/23/2014 0122   RBC 5.05 02/23/2014 0122   HGB 16.9 02/23/2014 0122   HCT 47.6 02/23/2014 0122   PLT 146* 02/23/2014 0122   MCV 94.3 02/23/2014 0122   MCH 33.5 02/23/2014 0122   MCHC 35.5 02/23/2014 0122   RDW 14.4 02/23/2014 0122   LYMPHSABS 2.3 02/23/2014 0122   MONOABS 1.4* 02/23/2014 0122   EOSABS 0.1 02/23/2014 0122   BASOSABS 0.0 02/23/2014 0122     Review of Systems Married. One child with atrial fib.    Objective:   Physical Exam  Filed Vitals:   03/09/14 0938  Height: $Remove'5\' 7"'cxkfiwx$  (1.702 m)  Weight: 217 lb 1.6 oz (98.476 kg)   Alert and oriented. Skin warm and dry. Oral mucosa is moist.   . Sclera anicteric, conjunctivae is pink. Thyroid not enlarged. No cervical lymphadenopathy. Lungs clear. Heart regular rate and rhythm.  Abdomen is soft. Bowel sounds are positive. No hepatomegaly. No abdominal masses felt. No tenderness.  No edema to lower extremities.         Assessment & Plan:  Acute diverticulitis, resolved. No abdominal pain.  Loose stools: Will start on Imodium daily and see if this helps.  Last Colonoscopy in 2013. OV in 6 months

## 2014-03-09 NOTE — Patient Instructions (Signed)
Imodium daily. Increase fiber in diet.

## 2014-03-21 ENCOUNTER — Ambulatory Visit (INDEPENDENT_AMBULATORY_CARE_PROVIDER_SITE_OTHER): Payer: Medicare Other | Admitting: *Deleted

## 2014-03-21 DIAGNOSIS — Z5181 Encounter for therapeutic drug level monitoring: Secondary | ICD-10-CM

## 2014-03-21 DIAGNOSIS — I4891 Unspecified atrial fibrillation: Secondary | ICD-10-CM

## 2014-03-21 DIAGNOSIS — I4892 Unspecified atrial flutter: Secondary | ICD-10-CM

## 2014-03-21 DIAGNOSIS — Z7901 Long term (current) use of anticoagulants: Secondary | ICD-10-CM

## 2014-03-21 DIAGNOSIS — I679 Cerebrovascular disease, unspecified: Secondary | ICD-10-CM

## 2014-03-21 DIAGNOSIS — G450 Vertebro-basilar artery syndrome: Secondary | ICD-10-CM

## 2014-03-21 LAB — POCT INR: INR: 2

## 2014-04-11 ENCOUNTER — Ambulatory Visit (INDEPENDENT_AMBULATORY_CARE_PROVIDER_SITE_OTHER): Payer: Medicare Other | Admitting: *Deleted

## 2014-04-11 DIAGNOSIS — I4892 Unspecified atrial flutter: Secondary | ICD-10-CM

## 2014-04-11 DIAGNOSIS — G450 Vertebro-basilar artery syndrome: Secondary | ICD-10-CM

## 2014-04-11 DIAGNOSIS — I679 Cerebrovascular disease, unspecified: Secondary | ICD-10-CM

## 2014-04-11 DIAGNOSIS — Z5181 Encounter for therapeutic drug level monitoring: Secondary | ICD-10-CM

## 2014-04-11 DIAGNOSIS — I480 Paroxysmal atrial fibrillation: Secondary | ICD-10-CM

## 2014-04-11 DIAGNOSIS — Z7901 Long term (current) use of anticoagulants: Secondary | ICD-10-CM

## 2014-04-11 DIAGNOSIS — I4891 Unspecified atrial fibrillation: Secondary | ICD-10-CM

## 2014-04-11 LAB — POCT INR: INR: 1.8

## 2014-04-12 ENCOUNTER — Other Ambulatory Visit: Payer: Self-pay | Admitting: *Deleted

## 2014-04-12 MED ORDER — ATENOLOL-CHLORTHALIDONE 100-25 MG PO TABS: 1.0000 | ORAL_TABLET | Freq: Every day | ORAL | Status: DC

## 2014-04-14 ENCOUNTER — Telehealth (INDEPENDENT_AMBULATORY_CARE_PROVIDER_SITE_OTHER): Payer: Self-pay | Admitting: *Deleted

## 2014-04-14 NOTE — Telephone Encounter (Signed)
We rec'd a letter from [employer], they state that Nexium is no longer on the patient's formulary. They do cover Pantoprazole. Per Dr.Rehaman call in - Pantoprazole 40 mg - take 1 by mouth every morning #30 5 refills. This was called to [facility]. Patient was also called and made aware.

## 2014-04-25 ENCOUNTER — Ambulatory Visit (INDEPENDENT_AMBULATORY_CARE_PROVIDER_SITE_OTHER): Payer: Medicare Other | Admitting: *Deleted

## 2014-04-25 DIAGNOSIS — I4891 Unspecified atrial fibrillation: Secondary | ICD-10-CM

## 2014-04-25 DIAGNOSIS — Z7901 Long term (current) use of anticoagulants: Secondary | ICD-10-CM

## 2014-04-25 DIAGNOSIS — I480 Paroxysmal atrial fibrillation: Secondary | ICD-10-CM

## 2014-04-25 DIAGNOSIS — Z5181 Encounter for therapeutic drug level monitoring: Secondary | ICD-10-CM

## 2014-04-25 DIAGNOSIS — I4892 Unspecified atrial flutter: Secondary | ICD-10-CM

## 2014-04-25 DIAGNOSIS — G450 Vertebro-basilar artery syndrome: Secondary | ICD-10-CM

## 2014-04-25 DIAGNOSIS — I679 Cerebrovascular disease, unspecified: Secondary | ICD-10-CM

## 2014-04-25 LAB — POCT INR: INR: 2.2

## 2014-04-25 MED ORDER — WARFARIN SODIUM 2.5 MG PO TABS: ORAL_TABLET | ORAL | Status: DC

## 2014-05-09 ENCOUNTER — Other Ambulatory Visit: Payer: Self-pay | Admitting: Cardiology

## 2014-05-09 ENCOUNTER — Other Ambulatory Visit: Payer: Self-pay | Admitting: *Deleted

## 2014-05-09 MED ORDER — DILTIAZEM HCL 30 MG PO TABS: 30.0000 mg | ORAL_TABLET | Freq: Two times a day (BID) | ORAL | Status: DC

## 2014-05-09 NOTE — Telephone Encounter (Signed)
Received fax refill request  Rx # [id number] Medication:  Diltiazem 30 mg tablet Qty 60 Sig:  Take one tablet twice a day Physician:  Harl Bowie

## 2014-05-09 NOTE — Telephone Encounter (Signed)
escribed as requested

## 2014-05-09 NOTE — Telephone Encounter (Signed)
escribed as requested confirmed by pharmacy

## 2014-05-09 NOTE — Telephone Encounter (Signed)
Pt needs refill on diltiazem. states they called pharmacy already/tmj

## 2014-05-23 ENCOUNTER — Ambulatory Visit (INDEPENDENT_AMBULATORY_CARE_PROVIDER_SITE_OTHER): Payer: Medicare Other | Admitting: *Deleted

## 2014-05-23 DIAGNOSIS — G450 Vertebro-basilar artery syndrome: Secondary | ICD-10-CM

## 2014-05-23 DIAGNOSIS — I4892 Unspecified atrial flutter: Secondary | ICD-10-CM

## 2014-05-23 DIAGNOSIS — I480 Paroxysmal atrial fibrillation: Secondary | ICD-10-CM

## 2014-05-23 DIAGNOSIS — Z5181 Encounter for therapeutic drug level monitoring: Secondary | ICD-10-CM

## 2014-05-23 DIAGNOSIS — I679 Cerebrovascular disease, unspecified: Secondary | ICD-10-CM

## 2014-05-23 DIAGNOSIS — I4891 Unspecified atrial fibrillation: Secondary | ICD-10-CM

## 2014-05-23 DIAGNOSIS — Z7901 Long term (current) use of anticoagulants: Secondary | ICD-10-CM

## 2014-05-23 LAB — POCT INR: INR: 3.3

## 2014-05-23 MED ORDER — ATENOLOL-CHLORTHALIDONE 100-25 MG PO TABS: 1.0000 | ORAL_TABLET | Freq: Every day | ORAL | Status: DC

## 2014-06-13 ENCOUNTER — Ambulatory Visit (INDEPENDENT_AMBULATORY_CARE_PROVIDER_SITE_OTHER): Payer: Medicare Other | Admitting: *Deleted

## 2014-06-13 DIAGNOSIS — Z7901 Long term (current) use of anticoagulants: Secondary | ICD-10-CM

## 2014-06-13 DIAGNOSIS — I4891 Unspecified atrial fibrillation: Secondary | ICD-10-CM | POA: Diagnosis not present

## 2014-06-13 DIAGNOSIS — I480 Paroxysmal atrial fibrillation: Secondary | ICD-10-CM

## 2014-06-13 DIAGNOSIS — Z5181 Encounter for therapeutic drug level monitoring: Secondary | ICD-10-CM | POA: Diagnosis not present

## 2014-06-13 DIAGNOSIS — G450 Vertebro-basilar artery syndrome: Secondary | ICD-10-CM

## 2014-06-13 DIAGNOSIS — I679 Cerebrovascular disease, unspecified: Secondary | ICD-10-CM

## 2014-06-13 DIAGNOSIS — I4892 Unspecified atrial flutter: Secondary | ICD-10-CM

## 2014-06-13 LAB — POCT INR: INR: 2.7

## 2014-07-11 ENCOUNTER — Ambulatory Visit (INDEPENDENT_AMBULATORY_CARE_PROVIDER_SITE_OTHER): Payer: Medicare Other | Admitting: *Deleted

## 2014-07-11 DIAGNOSIS — Z7901 Long term (current) use of anticoagulants: Secondary | ICD-10-CM

## 2014-07-11 DIAGNOSIS — I679 Cerebrovascular disease, unspecified: Secondary | ICD-10-CM

## 2014-07-11 DIAGNOSIS — I4892 Unspecified atrial flutter: Secondary | ICD-10-CM

## 2014-07-11 DIAGNOSIS — Z5181 Encounter for therapeutic drug level monitoring: Secondary | ICD-10-CM

## 2014-07-11 DIAGNOSIS — I4891 Unspecified atrial fibrillation: Secondary | ICD-10-CM

## 2014-07-11 DIAGNOSIS — G450 Vertebro-basilar artery syndrome: Secondary | ICD-10-CM

## 2014-07-11 LAB — POCT INR: INR: 3.2

## 2014-07-19 ENCOUNTER — Encounter (INDEPENDENT_AMBULATORY_CARE_PROVIDER_SITE_OTHER): Payer: Self-pay | Admitting: *Deleted

## 2014-08-08 ENCOUNTER — Ambulatory Visit (INDEPENDENT_AMBULATORY_CARE_PROVIDER_SITE_OTHER): Payer: Medicare Other | Admitting: *Deleted

## 2014-08-08 DIAGNOSIS — G450 Vertebro-basilar artery syndrome: Secondary | ICD-10-CM

## 2014-08-08 DIAGNOSIS — I4891 Unspecified atrial fibrillation: Secondary | ICD-10-CM | POA: Diagnosis not present

## 2014-08-08 DIAGNOSIS — Z5181 Encounter for therapeutic drug level monitoring: Secondary | ICD-10-CM | POA: Diagnosis not present

## 2014-08-08 DIAGNOSIS — I4892 Unspecified atrial flutter: Secondary | ICD-10-CM

## 2014-08-08 DIAGNOSIS — Z7901 Long term (current) use of anticoagulants: Secondary | ICD-10-CM

## 2014-08-08 DIAGNOSIS — I679 Cerebrovascular disease, unspecified: Secondary | ICD-10-CM

## 2014-08-08 LAB — POCT INR: INR: 2.9

## 2014-09-07 ENCOUNTER — Ambulatory Visit (INDEPENDENT_AMBULATORY_CARE_PROVIDER_SITE_OTHER): Payer: Medicare Other | Admitting: Internal Medicine

## 2014-09-12 ENCOUNTER — Ambulatory Visit (INDEPENDENT_AMBULATORY_CARE_PROVIDER_SITE_OTHER): Payer: Medicare Other | Admitting: *Deleted

## 2014-09-12 DIAGNOSIS — Z7901 Long term (current) use of anticoagulants: Secondary | ICD-10-CM

## 2014-09-12 DIAGNOSIS — I679 Cerebrovascular disease, unspecified: Secondary | ICD-10-CM | POA: Diagnosis not present

## 2014-09-12 DIAGNOSIS — I4891 Unspecified atrial fibrillation: Secondary | ICD-10-CM

## 2014-09-12 DIAGNOSIS — G450 Vertebro-basilar artery syndrome: Secondary | ICD-10-CM | POA: Diagnosis not present

## 2014-09-12 DIAGNOSIS — Z5181 Encounter for therapeutic drug level monitoring: Secondary | ICD-10-CM

## 2014-09-12 DIAGNOSIS — I4892 Unspecified atrial flutter: Secondary | ICD-10-CM

## 2014-09-12 LAB — POCT INR: INR: 2.3

## 2014-09-19 ENCOUNTER — Other Ambulatory Visit: Payer: Self-pay

## 2014-09-19 MED ORDER — ATENOLOL-CHLORTHALIDONE 100-25 MG PO TABS: 1.0000 | ORAL_TABLET | Freq: Every day | ORAL | Status: DC

## 2014-10-05 ENCOUNTER — Telehealth: Payer: Self-pay | Admitting: *Deleted

## 2014-10-05 MED ORDER — WARFARIN SODIUM 2.5 MG PO TABS: ORAL_TABLET | ORAL | Status: DC

## 2014-10-05 NOTE — Telephone Encounter (Signed)
Needs refill on Warfarin sent to [facility] / tg

## 2014-10-12 ENCOUNTER — Other Ambulatory Visit (INDEPENDENT_AMBULATORY_CARE_PROVIDER_SITE_OTHER): Payer: Self-pay | Admitting: *Deleted

## 2014-10-12 MED ORDER — PANTOPRAZOLE SODIUM 40 MG PO TBEC: 40.0000 mg | DELAYED_RELEASE_TABLET | Freq: Every day | ORAL | Status: DC

## 2014-10-12 NOTE — Telephone Encounter (Signed)
Prescription sent to [facility]

## 2014-11-14 ENCOUNTER — Ambulatory Visit (INDEPENDENT_AMBULATORY_CARE_PROVIDER_SITE_OTHER): Payer: Medicare Other | Admitting: *Deleted

## 2014-11-14 DIAGNOSIS — Z7901 Long term (current) use of anticoagulants: Secondary | ICD-10-CM

## 2014-11-14 DIAGNOSIS — I679 Cerebrovascular disease, unspecified: Secondary | ICD-10-CM | POA: Diagnosis not present

## 2014-11-14 DIAGNOSIS — I4891 Unspecified atrial fibrillation: Secondary | ICD-10-CM

## 2014-11-14 DIAGNOSIS — G450 Vertebro-basilar artery syndrome: Secondary | ICD-10-CM

## 2014-11-14 DIAGNOSIS — I4892 Unspecified atrial flutter: Secondary | ICD-10-CM

## 2014-11-14 DIAGNOSIS — Z5181 Encounter for therapeutic drug level monitoring: Secondary | ICD-10-CM | POA: Diagnosis not present

## 2014-11-14 LAB — POCT INR: INR: 3.2

## 2014-11-14 MED ORDER — ATENOLOL-CHLORTHALIDONE 100-25 MG PO TABS: 1.0000 | ORAL_TABLET | Freq: Every day | ORAL | Status: DC

## 2014-11-14 MED ORDER — WARFARIN SODIUM 2.5 MG PO TABS: ORAL_TABLET | ORAL | Status: DC

## 2014-11-14 MED ORDER — DILTIAZEM HCL 30 MG PO TABS: 30.0000 mg | ORAL_TABLET | Freq: Two times a day (BID) | ORAL | Status: DC

## 2014-12-07 ENCOUNTER — Other Ambulatory Visit: Payer: Self-pay | Admitting: Cardiovascular Disease

## 2014-12-07 ENCOUNTER — Other Ambulatory Visit: Payer: Self-pay | Admitting: *Deleted

## 2014-12-07 MED ORDER — DILTIAZEM HCL 30 MG PO TABS: 30.0000 mg | ORAL_TABLET | Freq: Two times a day (BID) | ORAL | Status: DC

## 2014-12-07 NOTE — Telephone Encounter (Signed)
Needs refill on Diltiazem sent to [facility] / wants 90 day supply / tg

## 2014-12-12 ENCOUNTER — Ambulatory Visit (INDEPENDENT_AMBULATORY_CARE_PROVIDER_SITE_OTHER): Payer: Medicare Other | Admitting: *Deleted

## 2014-12-12 DIAGNOSIS — I679 Cerebrovascular disease, unspecified: Secondary | ICD-10-CM

## 2014-12-12 DIAGNOSIS — G450 Vertebro-basilar artery syndrome: Secondary | ICD-10-CM

## 2014-12-12 DIAGNOSIS — I4892 Unspecified atrial flutter: Secondary | ICD-10-CM

## 2014-12-12 DIAGNOSIS — Z7901 Long term (current) use of anticoagulants: Secondary | ICD-10-CM

## 2014-12-12 DIAGNOSIS — I4891 Unspecified atrial fibrillation: Secondary | ICD-10-CM

## 2014-12-12 DIAGNOSIS — Z5181 Encounter for therapeutic drug level monitoring: Secondary | ICD-10-CM

## 2014-12-12 LAB — POCT INR: INR: 2.6

## 2015-01-09 ENCOUNTER — Ambulatory Visit (INDEPENDENT_AMBULATORY_CARE_PROVIDER_SITE_OTHER): Payer: Medicare Other | Admitting: *Deleted

## 2015-01-09 DIAGNOSIS — I4891 Unspecified atrial fibrillation: Secondary | ICD-10-CM

## 2015-01-09 DIAGNOSIS — Z5181 Encounter for therapeutic drug level monitoring: Secondary | ICD-10-CM | POA: Diagnosis not present

## 2015-01-09 DIAGNOSIS — G450 Vertebro-basilar artery syndrome: Secondary | ICD-10-CM

## 2015-01-09 DIAGNOSIS — I4892 Unspecified atrial flutter: Secondary | ICD-10-CM

## 2015-01-09 DIAGNOSIS — I679 Cerebrovascular disease, unspecified: Secondary | ICD-10-CM | POA: Diagnosis not present

## 2015-01-09 DIAGNOSIS — Z7901 Long term (current) use of anticoagulants: Secondary | ICD-10-CM

## 2015-01-09 LAB — POCT INR: INR: 2.8

## 2015-01-09 MED ORDER — WARFARIN SODIUM 2.5 MG PO TABS: ORAL_TABLET | ORAL | Status: DC

## 2015-01-09 MED ORDER — ATENOLOL-CHLORTHALIDONE 100-25 MG PO TABS: 1.0000 | ORAL_TABLET | Freq: Every day | ORAL | Status: DC

## 2015-01-17 ENCOUNTER — Encounter: Payer: Self-pay | Admitting: Cardiology

## 2015-01-17 ENCOUNTER — Ambulatory Visit (INDEPENDENT_AMBULATORY_CARE_PROVIDER_SITE_OTHER): Payer: Medicare Other | Admitting: Cardiology

## 2015-01-17 VITALS — BP 116/76 | HR 107 | Ht 66.0 in | Wt 228.0 lb

## 2015-01-17 DIAGNOSIS — I4891 Unspecified atrial fibrillation: Secondary | ICD-10-CM

## 2015-01-17 DIAGNOSIS — I10 Essential (primary) hypertension: Secondary | ICD-10-CM | POA: Diagnosis not present

## 2015-01-17 DIAGNOSIS — Z79899 Other long term (current) drug therapy: Secondary | ICD-10-CM

## 2015-01-17 MED ORDER — ATENOLOL 100 MG PO TABS: 100.0000 mg | ORAL_TABLET | Freq: Every day | ORAL | Status: DC

## 2015-01-17 MED ORDER — DILTIAZEM HCL 60 MG PO TABS: 60.0000 mg | ORAL_TABLET | Freq: Two times a day (BID) | ORAL | Status: DC

## 2015-01-17 NOTE — Progress Notes (Signed)
Patient ID: Daniel Morse, male   DOB: 12-28-1940, 74 y.o.   MRN: [id number]     Clinical Summary Daniel Morse is a 74 y.o.male seen today for follow up of the following medical problems.   1. Afib - denies any palpitaitons. He is on atenolol and diltiazem for rate control.  - on couamadin,denies denies any recent bleeding   Past Medical History  Diagnosis Date  . Hyperlipidemia     Metabolic syndrome with increased triglycerides  . Hypertension   . GERD (gastroesophageal reflux disease)     h/o esophagitis  . Atrial flutter 06/2010  . Nephrolithiasis   . Chest pain     negative stress nuclear in 11/2002  . Macular degeneration     legally blind  . Tobacco abuse     50 pack years; 1/2 pack per day  . Cerebrovascular disease 2008    Left CEA  . Chronic anticoagulation   . History of colonic polyps     Colonoscopy and polypectomy in 03/2011     Allergies  Allergen Reactions  . Simvastatin Other (See Comments)    Severe myalgias     Current Outpatient Prescriptions  Medication Sig Dispense Refill  . atenolol-chlorthalidone (TENORETIC) 100-25 MG tablet Take 1 tablet by mouth daily. 90 tablet 3  . diltiazem (CARDIZEM) 30 MG tablet Take 1 tablet (30 mg total) by mouth 2 (two) times daily. 60 tablet 0  . esomeprazole (NEXIUM) 20 MG capsule Take 1 capsule (20 mg total) by mouth daily at 12 noon. 30 capsule 4  . levothyroxine (SYNTHROID, LEVOTHROID) 50 MCG tablet Take 50 mcg by mouth daily before breakfast.    . Multiple Vitamins-Minerals (ICAPS PO) Take by mouth.    . pantoprazole (PROTONIX) 40 MG tablet Take 1 tablet (40 mg total) by mouth daily. 90 tablet 3  . potassium chloride SA (K-DUR,KLOR-CON) 20 MEQ tablet Take 20 mEq by mouth 2 (two) times daily.    Marland Kitchen Specialty Vitamins Products (ICAPS LUTEIN-ZEAXANTHIN PO) Take 1 tablet by mouth daily.    Marland Kitchen warfarin (COUMADIN) 2.5 MG tablet Take 1 1/2 tablets daily except 1 on Sundays, Tuesdays and Thursdays 135 tablet 3   No  current facility-administered medications for this visit.     Past Surgical History  Procedure Laterality Date  . Cystoscopy w/ ureteral stent placement      and stone extraction  . Carotid endarterectomy  2008    Left  . Colonoscopy  04/04/2011    Rogene Houston, MD;  multiple polyps ablated     Allergies  Allergen Reactions  . Simvastatin Other (See Comments)    Severe myalgias      Family History  Problem Relation Age of Onset  . Colon cancer Paternal Uncle      Social History Daniel Morse reports that he quit smoking about 5 years ago. His smoking use included Cigarettes. He started smoking about 36 years ago. He has a 25 pack-year smoking history. He quit smokeless tobacco use about 4 years ago. Daniel Morse reports that he does not drink alcohol.   Review of Systems CONSTITUTIONAL: No weight loss, fever, chills, weakness or fatigue.  HEENT: Eyes: No visual loss, blurred vision, double vision or yellow sclerae.No hearing loss, sneezing, congestion, runny nose or sore throat.  SKIN: No rash or itching.  CARDIOVASCULAR: no chest pain, no palpitations.  RESPIRATORY: No shortness of breath, cough or sputum.  GASTROINTESTINAL: No anorexia, nausea, vomiting or diarrhea. No abdominal pain or blood.  GENITOURINARY: No burning on urination, no polyuria NEUROLOGICAL: No headache, dizziness, syncope, paralysis, ataxia, numbness or tingling in the extremities. No change in bowel or bladder control.  MUSCULOSKELETAL: No muscle, back pain, joint pain or stiffness.  LYMPHATICS: No enlarged nodes. No history of splenectomy.  PSYCHIATRIC: No history of depression or anxiety.  ENDOCRINOLOGIC: No reports of sweating, cold or heat intolerance. No polyuria or polydipsia.  Marland Kitchen   Physical Examination Filed Vitals:   01/17/15 0846  BP: 116/76  Pulse: 107   Filed Vitals:   01/17/15 0846  Height: $Remove'5\' 6"'wfjOwNN$  (1.676 m)  Weight: 228 lb (103.42 kg)    Gen: resting comfortably, no acute  distress HEENT: no scleral icterus, pupils equal round and reactive, no palptable cervical adenopathy,  CV: irreg, tachy, no m/r/g, no jvd Resp: Clear to auscultation bilaterally GI: abdomen is soft, non-tender, non-distended, normal bowel sounds, no hepatosplenomegaly MSK: extremities are warm, no edema.  Skin: warm, no rash Neuro:  no focal deficits Psych: appropriate affect   Diagnostic Studies 08/2010 Echo Study Conclusions  - Left ventricle: The cavity size was normal. Mild LVH with  disproportionate thickening of the septum. Systolic function was  mildly to moderately reduced. The estimated ejection fraction was  45%. Mild global hypokinesis; moderate hypokinesis of the mid and  distal septum. - Left atrium: The atrium was mildly to moderately dilated. - Right atrium: The atrium was mildly to moderately dilated. - Atrial septum: No defect or patent foramen ovale was identified. - Pulmonary arteries: PA peak pressure: 65mm Hg (S). Transthoracic echocardiography. M-mode, complete 2D, spectral   04/2013 Carotid US IMPRESSION:  1. No evidence of residual or recurrent stenosis post left carotid  endarterectomy.  2. Mild right carotid bifurcation plaque resulting in less than 50%  diameter stenosis. The exam does not exclude plaque ulceration or  embolization. Continued surveillance recommended.    Assessment and Plan   1. Afib - rates elevated in clinic - will increase dilt to $Remov'60mg'lZxZbF$  bid - continue coumadin for stroke prevention, he has not been interested in NOACs  2. HTN - increaseing dilt for rate control. Given recurrent issues with low K will stop his chlorthalidone and KCl supplement - check labs in 2 weeks.    F/u 6 months      Arnoldo Lenis, M.D.

## 2015-01-17 NOTE — Patient Instructions (Signed)
Medication Instructions:  START ATENOLOL 100 MG DAILY INCREASE DILTIAZEM TO 60 MG TWICE DAILY STOP THE ATENOLOL/CHLORITHALIDONE STOP POTASSIUM   Labwork: Your physician recommends that you return for lab work in: [place] [facility]  Testing/Procedures: NONE  Follow-Up: Your physician wants you to follow-up in: [place] DR. BRANCH. You will receive a reminder letter in the mail two months in advance. If you don't receive a letter, please call our office to schedule the follow-up appointment.   Any Other Special Instructions Will Be Listed Below (If Applicable).  Thanks for choosing [facility]!!!       If you need a refill on your cardiac medications before your next appointment, please call your pharmacy.

## 2015-01-30 ENCOUNTER — Telehealth: Payer: Self-pay | Admitting: Cardiology

## 2015-01-30 NOTE — Telephone Encounter (Signed)
Pt states that he only took the atenolol 100 mg & increased Diltiazem 60 mg  For a total of 7 days before he stopped them due to severe dizziness, jitters & not being able to sleep well. He went back to his old dosage of  Diltiazem. He stated that he tried to tolerate the new meds, but he just couldn't. Please advise.

## 2015-01-30 NOTE — Telephone Encounter (Signed)
Pt will take only atenolol 100 mg daily and will make 6 week f/u apt

## 2015-01-30 NOTE — Telephone Encounter (Signed)
Have his symptoms resolved back on his hold dose. Its ok that he is back on his old dose, lets change his f/u with to 6 weeks.   Zandra Abts MD

## 2015-01-30 NOTE — Telephone Encounter (Signed)
Pt is having some problems w/ the new meds Dr. Harl Bowie started him on 11/15 visit, pt states it was making him dizzy, jittery, and was not sleeping well

## 2015-02-13 ENCOUNTER — Ambulatory Visit (INDEPENDENT_AMBULATORY_CARE_PROVIDER_SITE_OTHER): Payer: Medicare Other | Admitting: *Deleted

## 2015-02-13 DIAGNOSIS — Z5181 Encounter for therapeutic drug level monitoring: Secondary | ICD-10-CM | POA: Diagnosis not present

## 2015-02-13 DIAGNOSIS — I679 Cerebrovascular disease, unspecified: Secondary | ICD-10-CM

## 2015-02-13 DIAGNOSIS — I4892 Unspecified atrial flutter: Secondary | ICD-10-CM

## 2015-02-13 DIAGNOSIS — G450 Vertebro-basilar artery syndrome: Secondary | ICD-10-CM

## 2015-02-13 DIAGNOSIS — I4891 Unspecified atrial fibrillation: Secondary | ICD-10-CM | POA: Diagnosis not present

## 2015-02-13 DIAGNOSIS — Z7901 Long term (current) use of anticoagulants: Secondary | ICD-10-CM

## 2015-02-13 LAB — POCT INR: INR: 2.4

## 2015-03-14 ENCOUNTER — Ambulatory Visit: Payer: Medicare Other | Admitting: Cardiology

## 2015-03-17 ENCOUNTER — Ambulatory Visit (INDEPENDENT_AMBULATORY_CARE_PROVIDER_SITE_OTHER): Payer: Medicare Other | Admitting: Cardiology

## 2015-03-17 ENCOUNTER — Encounter: Payer: Self-pay | Admitting: Cardiology

## 2015-03-17 VITALS — BP 112/70 | HR 108 | Ht 66.0 in | Wt 222.0 lb

## 2015-03-17 DIAGNOSIS — I482 Chronic atrial fibrillation: Secondary | ICD-10-CM

## 2015-03-17 DIAGNOSIS — I4891 Unspecified atrial fibrillation: Secondary | ICD-10-CM

## 2015-03-17 DIAGNOSIS — I10 Essential (primary) hypertension: Secondary | ICD-10-CM

## 2015-03-17 DIAGNOSIS — I6523 Occlusion and stenosis of bilateral carotid arteries: Secondary | ICD-10-CM | POA: Diagnosis not present

## 2015-03-17 NOTE — Patient Instructions (Signed)
Your physician wants you to follow-up in: 6 months with Dr Bryna Colander will receive a reminder letter in the mail two months in advance. If you don't receive a letter, please call our office to schedule the follow-up appointment.   Your physician recommends that you continue on your current medications as directed. Please refer to the Current Medication list given to you today.    Your physician has requested that you have a carotid duplex. This test is an ultrasound of the carotid arteries in your neck. It looks at blood flow through these arteries that supply the brain with blood. Allow one hour for this exam. There are no restrictions or special instructions.     Thank you for choosing [facility] !

## 2015-03-17 NOTE — Progress Notes (Signed)
Patient ID: ALA CAPRI, male   DOB: 10-Feb-1941, 75 y.o.   MRN: [id number]     Clinical Summary [name] is a 75 y.o.male seen today for follow up of the following medical problems.   1. Afib - no recent palpitations. No recent lightheadness or dizziness.  - denies any bleeding issues on of coumadin. He is not interested in NOACs.    2. HTN - compliant with meds  3. Screening for OSA - + snoring, no apneic episodes, no day time somnolence.   4. Carotid stenosis - left CEA in 05/2006 - statin intolerance. Reports he may have tried zetia in the past but unsure   Past Medical History  Diagnosis Date  . Hyperlipidemia     Metabolic syndrome with increased triglycerides  . Hypertension   . GERD (gastroesophageal reflux disease)     h/o esophagitis  . Atrial flutter ([place]) 06/2010  . Nephrolithiasis   . Chest pain     negative stress nuclear in 11/2002  . Macular degeneration     legally blind  . Tobacco abuse     50 pack years; 1/2 pack per day  . Cerebrovascular disease 2008    Left CEA  . Chronic anticoagulation   . History of colonic polyps     Colonoscopy and polypectomy in 03/2011     Allergies  Allergen Reactions  . Simvastatin Other (See Comments)    Severe myalgias     Current Outpatient Prescriptions  Medication Sig Dispense Refill  . atenolol (TENORMIN) 100 MG tablet Take 1 tablet (100 mg total) by mouth daily. 90 tablet 3  . diltiazem (CARDIZEM) 60 MG tablet Take 1 tablet (60 mg total) by mouth 2 (two) times daily. 180 tablet 3  . esomeprazole (NEXIUM) 20 MG capsule Take 1 capsule (20 mg total) by mouth daily at 12 noon. 30 capsule 4  . levothyroxine (SYNTHROID, LEVOTHROID) 50 MCG tablet Take 50 mcg by mouth daily before breakfast.    . Multiple Vitamins-Minerals (ICAPS PO) Take by mouth.    . pantoprazole (PROTONIX) 40 MG tablet Take 1 tablet (40 mg total) by mouth daily. 90 tablet 3  . Specialty Vitamins Products (ICAPS LUTEIN-ZEAXANTHIN PO)  Take 1 tablet by mouth daily.    Marland Kitchen warfarin (COUMADIN) 2.5 MG tablet Take 1 1/2 tablets daily except 1 on Sundays, Tuesdays and Thursdays 135 tablet 3   No current facility-administered medications for this visit.     Past Surgical History  Procedure Laterality Date  . Cystoscopy w/ ureteral stent placement      and stone extraction  . Carotid endarterectomy  2008    Left  . Colonoscopy  04/04/2011    Rogene Houston, MD;  multiple polyps ablated     Allergies  Allergen Reactions  . Simvastatin Other (See Comments)    Severe myalgias      Family History  Problem Relation Age of Onset  . Colon cancer Paternal Uncle      Social History [name] reports that he quit smoking about 5 years ago. His smoking use included Cigarettes. He started smoking about 36 years ago. He has a 25 pack-year smoking history. He quit smokeless tobacco use about 4 years ago. [name] reports that he does not drink alcohol.   Review of Systems CONSTITUTIONAL: No weight loss, fever, chills, weakness or fatigue.  HEENT: Eyes: No visual loss, blurred vision, double vision or yellow sclerae.No hearing loss, sneezing, congestion, runny nose or sore throat.  SKIN: No rash or itching.  CARDIOVASCULAR: per hpi RESPIRATORY: No shortness of breath, cough or sputum.  GASTROINTESTINAL: No anorexia, nausea, vomiting or diarrhea. No abdominal pain or blood.  GENITOURINARY: No burning on urination, no polyuria NEUROLOGICAL: No headache, dizziness, syncope, paralysis, ataxia, numbness or tingling in the extremities. No change in bowel or bladder control.  MUSCULOSKELETAL: No muscle, back pain, joint pain or stiffness.  LYMPHATICS: No enlarged nodes. No history of splenectomy.  PSYCHIATRIC: No history of depression or anxiety.  ENDOCRINOLOGIC: No reports of sweating, cold or heat intolerance. No polyuria or polydipsia.  Marland Kitchen   Physical Examination Filed Vitals:   03/17/15 0947  BP: 112/70  Pulse: 108    Filed Vitals:   03/17/15 0947  Height: $Remove'5\' 6"'Weuggmj$  (1.676 m)  Weight: 222 lb (100.699 kg)    Gen: resting comfortably, no acute distress HEENT: no scleral icterus, pupils equal round and reactive, no palptable cervical adenopathy,  CV: irreg, no m/r/g, no jvd Resp: Clear to auscultation bilaterally GI: abdomen is soft, non-tender, non-distended, normal bowel sounds, no hepatosplenomegaly MSK: extremities are warm, no edema.  Skin: warm, no rash Neuro:  no focal deficits Psych: appropriate affect   Diagnostic Studies 08/2010 Echo Study Conclusions  - Left ventricle: The cavity size was normal. Mild LVH with  disproportionate thickening of the septum. Systolic function was  mildly to moderately reduced. The estimated ejection fraction was  45%. Mild global hypokinesis; moderate hypokinesis of the mid and  distal septum. - Left atrium: The atrium was mildly to moderately dilated. - Right atrium: The atrium was mildly to moderately dilated. - Atrial septum: No defect or patent foramen ovale was identified. - Pulmonary arteries: PA peak pressure: 77mm Hg (S). Transthoracic echocardiography. M-mode, complete 2D, spectral   04/2013 Carotid US IMPRESSION:  1. No evidence of residual or recurrent stenosis post left carotid  endarterectomy.  2. Mild right carotid bifurcation plaque resulting in less than 50%  diameter stenosis. The exam does not exclude plaque ulceration or  embolization. Continued surveillance recommended.     Assessment and Plan  1. Afib - no recent symptoms, continue current meds - continue coumadin for stroke prevention, he has not been interested in NOACs  2. HTN - at goal, continue current meds  3. Carotid stenosis - we will repeat his surveillance carotid US   F/u 6 months      Arnoldo Lenis, M.D.

## 2015-03-23 ENCOUNTER — Ambulatory Visit (HOSPITAL_COMMUNITY)
Admission: RE | Admit: 2015-03-23 | Discharge: 2015-03-23 | Disposition: A | Discharge status: Home / Self Care | Payer: Medicare Other | Source: Ambulatory Visit | Attending: Cardiology | Admitting: Cardiology

## 2015-03-23 DIAGNOSIS — I6523 Occlusion and stenosis of bilateral carotid arteries: Secondary | ICD-10-CM | POA: Insufficient documentation

## 2015-03-27 ENCOUNTER — Ambulatory Visit (INDEPENDENT_AMBULATORY_CARE_PROVIDER_SITE_OTHER): Payer: Medicare Other | Admitting: *Deleted

## 2015-03-27 DIAGNOSIS — I679 Cerebrovascular disease, unspecified: Secondary | ICD-10-CM | POA: Diagnosis not present

## 2015-03-27 DIAGNOSIS — I480 Paroxysmal atrial fibrillation: Secondary | ICD-10-CM

## 2015-03-27 DIAGNOSIS — Z7901 Long term (current) use of anticoagulants: Secondary | ICD-10-CM | POA: Diagnosis not present

## 2015-03-27 DIAGNOSIS — I4891 Unspecified atrial fibrillation: Secondary | ICD-10-CM

## 2015-03-27 DIAGNOSIS — I4892 Unspecified atrial flutter: Secondary | ICD-10-CM

## 2015-03-27 DIAGNOSIS — Z5181 Encounter for therapeutic drug level monitoring: Secondary | ICD-10-CM | POA: Diagnosis not present

## 2015-03-27 DIAGNOSIS — G450 Vertebro-basilar artery syndrome: Secondary | ICD-10-CM | POA: Diagnosis not present

## 2015-03-27 LAB — POCT INR: INR: 2.1

## 2015-04-25 ENCOUNTER — Ambulatory Visit (INDEPENDENT_AMBULATORY_CARE_PROVIDER_SITE_OTHER): Payer: Medicare Other | Admitting: Internal Medicine

## 2015-05-08 ENCOUNTER — Ambulatory Visit (INDEPENDENT_AMBULATORY_CARE_PROVIDER_SITE_OTHER): Payer: Medicare Other | Admitting: *Deleted

## 2015-05-08 DIAGNOSIS — G450 Vertebro-basilar artery syndrome: Secondary | ICD-10-CM | POA: Diagnosis not present

## 2015-05-08 DIAGNOSIS — Z5181 Encounter for therapeutic drug level monitoring: Secondary | ICD-10-CM | POA: Diagnosis not present

## 2015-05-08 DIAGNOSIS — Z7901 Long term (current) use of anticoagulants: Secondary | ICD-10-CM

## 2015-05-08 DIAGNOSIS — I480 Paroxysmal atrial fibrillation: Secondary | ICD-10-CM

## 2015-05-08 DIAGNOSIS — I679 Cerebrovascular disease, unspecified: Secondary | ICD-10-CM | POA: Diagnosis not present

## 2015-05-08 DIAGNOSIS — I4892 Unspecified atrial flutter: Secondary | ICD-10-CM

## 2015-05-08 DIAGNOSIS — I4891 Unspecified atrial fibrillation: Secondary | ICD-10-CM

## 2015-05-08 LAB — POCT INR: INR: 2.2

## 2015-05-23 ENCOUNTER — Encounter: Payer: Self-pay | Admitting: Cardiology

## 2015-06-19 ENCOUNTER — Ambulatory Visit (INDEPENDENT_AMBULATORY_CARE_PROVIDER_SITE_OTHER): Payer: Medicare Other | Admitting: *Deleted

## 2015-06-19 DIAGNOSIS — G450 Vertebro-basilar artery syndrome: Secondary | ICD-10-CM | POA: Diagnosis not present

## 2015-06-19 DIAGNOSIS — I679 Cerebrovascular disease, unspecified: Secondary | ICD-10-CM | POA: Diagnosis not present

## 2015-06-19 DIAGNOSIS — Z5181 Encounter for therapeutic drug level monitoring: Secondary | ICD-10-CM | POA: Diagnosis not present

## 2015-06-19 DIAGNOSIS — Z7901 Long term (current) use of anticoagulants: Secondary | ICD-10-CM

## 2015-06-19 DIAGNOSIS — I4891 Unspecified atrial fibrillation: Secondary | ICD-10-CM | POA: Diagnosis not present

## 2015-06-19 DIAGNOSIS — I4892 Unspecified atrial flutter: Secondary | ICD-10-CM

## 2015-06-19 LAB — POCT INR: INR: 2.6

## 2015-07-28 ENCOUNTER — Telehealth: Payer: Self-pay | Admitting: Cardiology

## 2015-07-28 NOTE — Telephone Encounter (Signed)
Pt's ins is not going to cover his atenolol (TENORMIN) 100 MG tablet [[id number]] this year. Would like to know if Dr. Harl Bowie would put him on something else.

## 2015-07-28 NOTE — Telephone Encounter (Signed)
Pt's wife called back to say they're now using [facility]

## 2015-08-01 MED ORDER — METOPROLOL TARTRATE 50 MG PO TABS: 50.0000 mg | ORAL_TABLET | Freq: Two times a day (BID) | ORAL | Status: DC

## 2015-08-01 NOTE — Telephone Encounter (Signed)
Can stop atenolol and change to lopressor $RemoveBefo'50mg'hUioNdlvDQZ$  bid   Zandra Abts MD

## 2015-08-01 NOTE — Telephone Encounter (Signed)
Patients insurance will not cover Daniel Morse change as just an FYI to [place]

## 2015-08-01 NOTE — Telephone Encounter (Signed)
Pt understands to stop atenolol and start lopressor 50 mg twice a day

## 2015-08-01 NOTE — Telephone Encounter (Signed)
With pharmacy change is this still an issue for him with the atenolol?   Zandra Abts MD

## 2015-08-01 NOTE — Addendum Note (Signed)
Addended by: Barbarann Ehlers A on: 08/01/2015 01:49 PM   Modules accepted: Orders, Medications

## 2015-08-02 ENCOUNTER — Ambulatory Visit (INDEPENDENT_AMBULATORY_CARE_PROVIDER_SITE_OTHER): Payer: Medicare Other | Admitting: *Deleted

## 2015-08-02 DIAGNOSIS — I679 Cerebrovascular disease, unspecified: Secondary | ICD-10-CM

## 2015-08-02 DIAGNOSIS — G450 Vertebro-basilar artery syndrome: Secondary | ICD-10-CM | POA: Diagnosis not present

## 2015-08-02 DIAGNOSIS — I4891 Unspecified atrial fibrillation: Secondary | ICD-10-CM | POA: Diagnosis not present

## 2015-08-02 DIAGNOSIS — Z5181 Encounter for therapeutic drug level monitoring: Secondary | ICD-10-CM

## 2015-08-02 DIAGNOSIS — Z7901 Long term (current) use of anticoagulants: Secondary | ICD-10-CM | POA: Diagnosis not present

## 2015-08-02 DIAGNOSIS — I4892 Unspecified atrial flutter: Secondary | ICD-10-CM

## 2015-08-02 LAB — POCT INR: INR: 2.3

## 2015-09-18 ENCOUNTER — Ambulatory Visit (INDEPENDENT_AMBULATORY_CARE_PROVIDER_SITE_OTHER): Payer: Medicare Other | Admitting: *Deleted

## 2015-09-18 DIAGNOSIS — I4891 Unspecified atrial fibrillation: Secondary | ICD-10-CM | POA: Diagnosis not present

## 2015-09-18 DIAGNOSIS — I4892 Unspecified atrial flutter: Secondary | ICD-10-CM

## 2015-09-18 DIAGNOSIS — G450 Vertebro-basilar artery syndrome: Secondary | ICD-10-CM | POA: Diagnosis not present

## 2015-09-18 DIAGNOSIS — Z5181 Encounter for therapeutic drug level monitoring: Secondary | ICD-10-CM

## 2015-09-18 DIAGNOSIS — I679 Cerebrovascular disease, unspecified: Secondary | ICD-10-CM | POA: Diagnosis not present

## 2015-09-18 DIAGNOSIS — Z7901 Long term (current) use of anticoagulants: Secondary | ICD-10-CM

## 2015-09-18 LAB — POCT INR: INR: 2.5

## 2015-10-25 ENCOUNTER — Other Ambulatory Visit (INDEPENDENT_AMBULATORY_CARE_PROVIDER_SITE_OTHER): Payer: Self-pay | Admitting: Internal Medicine

## 2015-10-30 ENCOUNTER — Ambulatory Visit (INDEPENDENT_AMBULATORY_CARE_PROVIDER_SITE_OTHER): Payer: Medicare Other | Admitting: *Deleted

## 2015-10-30 DIAGNOSIS — I4891 Unspecified atrial fibrillation: Secondary | ICD-10-CM | POA: Diagnosis not present

## 2015-10-30 DIAGNOSIS — I679 Cerebrovascular disease, unspecified: Secondary | ICD-10-CM | POA: Diagnosis not present

## 2015-10-30 DIAGNOSIS — G450 Vertebro-basilar artery syndrome: Secondary | ICD-10-CM

## 2015-10-30 DIAGNOSIS — I4892 Unspecified atrial flutter: Secondary | ICD-10-CM

## 2015-10-30 DIAGNOSIS — Z7901 Long term (current) use of anticoagulants: Secondary | ICD-10-CM | POA: Diagnosis not present

## 2015-10-30 DIAGNOSIS — Z5181 Encounter for therapeutic drug level monitoring: Secondary | ICD-10-CM

## 2015-10-30 LAB — POCT INR: INR: 2.9

## 2015-12-11 ENCOUNTER — Ambulatory Visit (INDEPENDENT_AMBULATORY_CARE_PROVIDER_SITE_OTHER): Payer: Medicare Other | Admitting: *Deleted

## 2015-12-11 DIAGNOSIS — I4891 Unspecified atrial fibrillation: Secondary | ICD-10-CM

## 2015-12-11 DIAGNOSIS — G450 Vertebro-basilar artery syndrome: Secondary | ICD-10-CM | POA: Diagnosis not present

## 2015-12-11 DIAGNOSIS — I679 Cerebrovascular disease, unspecified: Secondary | ICD-10-CM | POA: Diagnosis not present

## 2015-12-11 DIAGNOSIS — Z7901 Long term (current) use of anticoagulants: Secondary | ICD-10-CM | POA: Diagnosis not present

## 2015-12-11 DIAGNOSIS — I4892 Unspecified atrial flutter: Secondary | ICD-10-CM

## 2015-12-11 DIAGNOSIS — Z5181 Encounter for therapeutic drug level monitoring: Secondary | ICD-10-CM

## 2015-12-11 LAB — POCT INR: INR: 2.8

## 2016-01-15 ENCOUNTER — Other Ambulatory Visit: Payer: Self-pay

## 2016-01-15 ENCOUNTER — Telehealth: Payer: Self-pay | Admitting: Cardiology

## 2016-01-15 MED ORDER — DILTIAZEM HCL 60 MG PO TABS: 60.0000 mg | ORAL_TABLET | Freq: Two times a day (BID) | ORAL | 0 refills | Status: DC

## 2016-01-15 MED ORDER — WARFARIN SODIUM 2.5 MG PO TABS: ORAL_TABLET | ORAL | 3 refills | Status: DC

## 2016-01-15 NOTE — Telephone Encounter (Signed)
REFILL : WARFARIN 2.5 MG   Please send to the [facility] , [place], [place]

## 2016-01-15 NOTE — Telephone Encounter (Signed)
Needs fu, #30 day supply given

## 2016-01-22 ENCOUNTER — Ambulatory Visit (INDEPENDENT_AMBULATORY_CARE_PROVIDER_SITE_OTHER): Payer: Medicare Other | Admitting: *Deleted

## 2016-01-22 DIAGNOSIS — G450 Vertebro-basilar artery syndrome: Secondary | ICD-10-CM | POA: Diagnosis not present

## 2016-01-22 DIAGNOSIS — I4891 Unspecified atrial fibrillation: Secondary | ICD-10-CM

## 2016-01-22 DIAGNOSIS — I679 Cerebrovascular disease, unspecified: Secondary | ICD-10-CM

## 2016-01-22 DIAGNOSIS — Z7901 Long term (current) use of anticoagulants: Secondary | ICD-10-CM

## 2016-01-22 DIAGNOSIS — I4892 Unspecified atrial flutter: Secondary | ICD-10-CM

## 2016-01-22 DIAGNOSIS — Z5181 Encounter for therapeutic drug level monitoring: Secondary | ICD-10-CM

## 2016-01-22 LAB — POCT INR: INR: 2.4

## 2016-02-21 ENCOUNTER — Telehealth: Payer: Self-pay | Admitting: Cardiology

## 2016-02-21 MED ORDER — DILTIAZEM HCL 60 MG PO TABS: 60.0000 mg | ORAL_TABLET | Freq: Two times a day (BID) | ORAL | 0 refills | Status: DC

## 2016-02-21 NOTE — Telephone Encounter (Signed)
1. Which medications need to be refilled? (please list name of each medication and dose if known)  diltiazem (CARDIZEM) 60 MG tablet [[id number]]    2. Which pharmacy/location (including street and city if local pharmacy) is medication to be sent to? [place]  3. Do they need a 30 day or 90 day supply?   Pt has made an apt to see Dr. Harl Bowie in Feb, offered sooner apt in [place], but pt refuses to go to [place].

## 2016-02-21 NOTE — Telephone Encounter (Signed)
Gave pt 1 month with no refills

## 2016-03-06 ENCOUNTER — Ambulatory Visit (INDEPENDENT_AMBULATORY_CARE_PROVIDER_SITE_OTHER): Payer: Medicare HMO | Admitting: *Deleted

## 2016-03-06 DIAGNOSIS — G450 Vertebro-basilar artery syndrome: Secondary | ICD-10-CM

## 2016-03-06 DIAGNOSIS — I4892 Unspecified atrial flutter: Secondary | ICD-10-CM

## 2016-03-06 DIAGNOSIS — I679 Cerebrovascular disease, unspecified: Secondary | ICD-10-CM

## 2016-03-06 DIAGNOSIS — I4891 Unspecified atrial fibrillation: Secondary | ICD-10-CM | POA: Diagnosis not present

## 2016-03-06 DIAGNOSIS — Z7901 Long term (current) use of anticoagulants: Secondary | ICD-10-CM

## 2016-03-06 DIAGNOSIS — Z5181 Encounter for therapeutic drug level monitoring: Secondary | ICD-10-CM

## 2016-03-06 LAB — POCT INR: INR: 1.9

## 2016-03-27 ENCOUNTER — Telehealth: Payer: Self-pay | Admitting: Cardiology

## 2016-03-27 MED ORDER — DILTIAZEM HCL 60 MG PO TABS: 60.0000 mg | ORAL_TABLET | Freq: Two times a day (BID) | ORAL | 0 refills | Status: DC

## 2016-03-27 NOTE — Telephone Encounter (Signed)
° °  1. Which medications need to be refilled? (please list name of each medication and dose if known diltiazem (CARDIZEM) 60 MG tablet [[id number]]    2. Which pharmacy/location (including street and city if local pharmacy) is medication to be sent to? [place]   3. Do they need a 30 day or 90 day supply? [place]

## 2016-04-02 ENCOUNTER — Encounter: Payer: Self-pay | Admitting: Internal Medicine

## 2016-04-02 ENCOUNTER — Encounter (INDEPENDENT_AMBULATORY_CARE_PROVIDER_SITE_OTHER): Payer: Self-pay | Admitting: *Deleted

## 2016-04-10 ENCOUNTER — Ambulatory Visit (INDEPENDENT_AMBULATORY_CARE_PROVIDER_SITE_OTHER): Payer: Medicare HMO | Admitting: *Deleted

## 2016-04-10 DIAGNOSIS — I4892 Unspecified atrial flutter: Secondary | ICD-10-CM

## 2016-04-10 DIAGNOSIS — I4891 Unspecified atrial fibrillation: Secondary | ICD-10-CM

## 2016-04-10 DIAGNOSIS — Z7901 Long term (current) use of anticoagulants: Secondary | ICD-10-CM

## 2016-04-10 DIAGNOSIS — I679 Cerebrovascular disease, unspecified: Secondary | ICD-10-CM | POA: Diagnosis not present

## 2016-04-10 DIAGNOSIS — G450 Vertebro-basilar artery syndrome: Secondary | ICD-10-CM

## 2016-04-10 DIAGNOSIS — Z5181 Encounter for therapeutic drug level monitoring: Secondary | ICD-10-CM

## 2016-04-10 LAB — POCT INR: INR: 2.3

## 2016-04-19 ENCOUNTER — Other Ambulatory Visit (INDEPENDENT_AMBULATORY_CARE_PROVIDER_SITE_OTHER): Payer: Self-pay | Admitting: *Deleted

## 2016-04-19 DIAGNOSIS — Z800 Family history of malignant neoplasm of digestive organs: Secondary | ICD-10-CM | POA: Insufficient documentation

## 2016-04-19 DIAGNOSIS — Z86010 Personal history of colonic polyps: Secondary | ICD-10-CM | POA: Insufficient documentation

## 2016-04-25 ENCOUNTER — Encounter: Payer: Self-pay | Admitting: Cardiology

## 2016-04-25 ENCOUNTER — Ambulatory Visit (INDEPENDENT_AMBULATORY_CARE_PROVIDER_SITE_OTHER): Payer: Medicare HMO | Admitting: Cardiology

## 2016-04-25 VITALS — BP 122/68 | HR 108 | Ht 66.0 in | Wt 235.0 lb

## 2016-04-25 DIAGNOSIS — I4891 Unspecified atrial fibrillation: Secondary | ICD-10-CM | POA: Diagnosis not present

## 2016-04-25 DIAGNOSIS — I10 Essential (primary) hypertension: Secondary | ICD-10-CM

## 2016-04-25 DIAGNOSIS — E782 Mixed hyperlipidemia: Secondary | ICD-10-CM

## 2016-04-25 DIAGNOSIS — I6523 Occlusion and stenosis of bilateral carotid arteries: Secondary | ICD-10-CM

## 2016-04-25 MED ORDER — METOPROLOL TARTRATE 50 MG PO TABS: 50.0000 mg | ORAL_TABLET | Freq: Two times a day (BID) | ORAL | 3 refills | Status: DC

## 2016-04-25 MED ORDER — DILTIAZEM HCL 60 MG PO TABS: 60.0000 mg | ORAL_TABLET | Freq: Two times a day (BID) | ORAL | 3 refills | Status: DC

## 2016-04-25 NOTE — Patient Instructions (Signed)

## 2016-04-25 NOTE — Progress Notes (Signed)
Clinical Summary Daniel Morse is a 76 y.o.male seen today for follow up of the following medical problems.   1. Afib -no recent palpitations - no bleeding issues on coumadin - compliant with meds  2. HTN - he is compliant with meds  3. Screening for OSA - + snoring, no apneic episodes, no day time somnolence.   4. Carotid stenosis - left CEA in 05/2006 - carotid US Jan 2017 mild bilateral disease -no recent neuro symptoms  5. Colonoscopy - upcoming procedure, will need to be off coumadin.   6. Hyperlipidemia - intolerant to statins - he is unsure if he has tried zetia in the past.   7. AAA screen - normal CT scan 2015  Upcoming labs with pcp Past Medical History:  Diagnosis Date  . Atrial flutter ([place]) 06/2010  . Cerebrovascular disease 2008   Left CEA  . Chest pain    negative stress nuclear in 11/2002  . Chronic anticoagulation   . GERD (gastroesophageal reflux disease)    h/o esophagitis  . History of colonic polyps    Colonoscopy and polypectomy in 03/2011  . Hyperlipidemia    Metabolic syndrome with increased triglycerides  . Hypertension   . Macular degeneration    legally blind  . Nephrolithiasis   . Tobacco abuse    50 pack years; 1/2 pack per day     Allergies  Allergen Reactions  . Simvastatin Other (See Comments)    Severe myalgias     Current Outpatient Prescriptions  Medication Sig Dispense Refill  . diltiazem (CARDIZEM) 60 MG tablet Take 1 tablet (60 mg total) by mouth 2 (two) times daily. 30 tablet 0  . esomeprazole (NEXIUM) 20 MG capsule Take 1 capsule (20 mg total) by mouth daily at 12 noon. 30 capsule 4  . levothyroxine (SYNTHROID, LEVOTHROID) 50 MCG tablet Take 50 mcg by mouth daily before breakfast.    . metoprolol (LOPRESSOR) 50 MG tablet Take 1 tablet (50 mg total) by mouth 2 (two) times daily. 180 tablet 3  . Multiple Vitamins-Minerals (ICAPS PO) Take by mouth.    . pantoprazole (PROTONIX) 40 MG tablet TAKE ONE TABLET BY  MOUTH ONCE DAILY. 90 tablet 0  . Specialty Vitamins Products (ICAPS LUTEIN-ZEAXANTHIN PO) Take 1 tablet by mouth daily.    Daniel Morse Kitchen warfarin (COUMADIN) 2.5 MG tablet Take 1 1/2 tablets daily except 1 on Sundays, Tuesdays and Thursdays 135 tablet 3   No current facility-administered medications for this visit.      Past Surgical History:  Procedure Laterality Date  . CAROTID ENDARTERECTOMY  2008   Left  . COLONOSCOPY  04/04/2011   Rogene Houston, MD;  multiple polyps ablated  . CYSTOSCOPY W/ URETERAL STENT PLACEMENT     and stone extraction     Allergies  Allergen Reactions  . Simvastatin Other (See Comments)    Severe myalgias      Family History  Problem Relation Age of Onset  . Colon cancer Paternal Uncle      Social History Mr. Voorhies reports that he quit smoking about 6 years ago. His smoking use included Cigarettes. He started smoking about 37 years ago. He has a 25.00 pack-year smoking history. He quit smokeless tobacco use about 5 years ago. Mr. Lipscomb reports that he does not drink alcohol.   Review of Systems CONSTITUTIONAL: No weight loss, fever, chills, weakness or fatigue.  HEENT: Eyes: No visual loss, blurred vision, double vision or yellow sclerae.No hearing loss, sneezing,  congestion, runny nose or sore throat.  SKIN: No rash or itching.  CARDIOVASCULAR: per HPI RESPIRATORY: No shortness of breath, cough or sputum.  GASTROINTESTINAL: No anorexia, nausea, vomiting or diarrhea. No abdominal pain or blood.  GENITOURINARY: No burning on urination, no polyuria NEUROLOGICAL: No headache, dizziness, syncope, paralysis, ataxia, numbness or tingling in the extremities. No change in bowel or bladder control.  MUSCULOSKELETAL: No muscle, back pain, joint pain or stiffness.  LYMPHATICS: No enlarged nodes. No history of splenectomy.  PSYCHIATRIC: No history of depression or anxiety.  ENDOCRINOLOGIC: No reports of sweating, cold or heat intolerance. No polyuria or  polydipsia.  Daniel Morse Kitchen   Physical Examination Vitals:   04/25/16 0841  BP: 122/68  Pulse: (!) 108   Vitals:   04/25/16 0841  Weight: 235 lb (106.6 kg)  Height: $Remove'5\' 6"'ivhtdmO$  (1.676 m)    Gen: resting comfortably, no acute distress HEENT: no scleral icterus, pupils equal round and reactive, no palptable cervical adenopathy,  CV: RRR, no m/r/g, no jvd Resp: Clear to auscultation bilaterally GI: abdomen is soft, non-tender, non-distended, normal bowel sounds, no hepatosplenomegaly MSK: extremities are warm, no edema.  Skin: warm, no rash Neuro:  no focal deficits Psych: appropriate affect   Diagnostic Studies  08/2010 Echo Study Conclusions  - Left ventricle: The cavity size was normal. Mild LVH with  disproportionate thickening of the septum. Systolic function was  mildly to moderately reduced. The estimated ejection fraction was  45%. Mild global hypokinesis; moderate hypokinesis of the mid and  distal septum. - Left atrium: The atrium was mildly to moderately dilated. - Right atrium: The atrium was mildly to moderately dilated. - Atrial septum: No defect or patent foramen ovale was identified. - Pulmonary arteries: PA peak pressure: 7mm Hg (S). Transthoracic echocardiography. M-mode, complete 2D, spectral   04/2013 Carotid US IMPRESSION:  1. No evidence of residual or recurrent stenosis post left carotid  endarterectomy.  2. Mild right carotid bifurcation plaque resulting in less than 50%  diameter stenosis. The exam does not exclude plaque ulceration or  embolization. Continued surveillance recommended.  Jan 2017 Carotid US IMPRESSION: 1. Mild right common carotid and carotid bifurcation atherosclerotic vascular plaque. No flow limiting stenosis. Degree of stenosis less than 50%. 2. Mild left common carotid artery plaque. Left carotid bifurcation widely patent. Prior left carotid endarterectomy. No flow limiting stenosis. Degree of stenosis less than 50%. 3.  Vertebral arteries are patent antegrade flow .   Assessment and Plan  1. Afib - no recent symptoms. EKG in clinic today shows afib rate 90 - continue coumadin for stroke prevention, he has not been interested in NOACs. CHADS2Vasc score is 3, continue anticoag.   2. HTN - he remains at goal, continue current meds  3. Carotid stenosis -continue to monitor  4. Hyperlipidemia - intolerant to statins - request labs from pcp - could consider zetia of pcsk9 inhibtior if significantly elevated LDL     F/u 1 year      Arnoldo Lenis, M.D., F.A.C.C.

## 2016-04-27 ENCOUNTER — Encounter: Payer: Self-pay | Admitting: Cardiology

## 2016-04-29 ENCOUNTER — Telehealth (INDEPENDENT_AMBULATORY_CARE_PROVIDER_SITE_OTHER): Payer: Self-pay | Admitting: *Deleted

## 2016-04-29 ENCOUNTER — Telehealth: Payer: Self-pay | Admitting: *Deleted

## 2016-04-29 ENCOUNTER — Encounter (INDEPENDENT_AMBULATORY_CARE_PROVIDER_SITE_OTHER): Payer: Self-pay | Admitting: *Deleted

## 2016-04-29 MED ORDER — PEG 3350-KCL-NA BICARB-NACL 420 G PO SOLR: 4000.0000 mL | Freq: Once | ORAL | 0 refills | Status: AC

## 2016-04-29 NOTE — Telephone Encounter (Signed)
Patient aware ok to hold Coumadin 5 days prior to TCS

## 2016-04-29 NOTE — Telephone Encounter (Signed)
OK for pt to hold coumadin 5 days before procedure.  No bridging required.  Sent to Dr Waylan Rocher office.

## 2016-04-29 NOTE — Telephone Encounter (Signed)
Patient needs trilyte 

## 2016-04-29 NOTE — Telephone Encounter (Signed)
-----   Message from Arnoldo Lenis, MD sent at 04/27/2016  6:23 PM EST ----- Lattie Haw, this patient has a colonoscopy March 8th. Will need to be off coumadin, does not require a bridge  Zandra Abts MD

## 2016-05-09 DIAGNOSIS — H25813 Combined forms of age-related cataract, bilateral: Secondary | ICD-10-CM | POA: Diagnosis not present

## 2016-05-09 DIAGNOSIS — H43811 Vitreous degeneration, right eye: Secondary | ICD-10-CM | POA: Diagnosis not present

## 2016-05-09 DIAGNOSIS — H353232 Exudative age-related macular degeneration, bilateral, with inactive choroidal neovascularization: Secondary | ICD-10-CM | POA: Diagnosis not present

## 2016-05-16 ENCOUNTER — Telehealth (INDEPENDENT_AMBULATORY_CARE_PROVIDER_SITE_OTHER): Payer: Self-pay | Admitting: *Deleted

## 2016-05-16 NOTE — Telephone Encounter (Signed)
agree

## 2016-05-16 NOTE — Telephone Encounter (Signed)
Referring MD/PCP: golding   Procedure: tcs  Reason/Indication:  Hx polyps, famhx colon ca  Has patient had this procedure before?  Yes, 03/2011  If so, when, by whom and where?    Is there a family history of colon cancer?  Yes, uncle  Who?  What age when diagnosed?    Is patient diabetic?   no      Does patient have prosthetic heart valve or mechanical valve?  no  Do you have a pacemaker?  no  Has patient ever had endocarditis? no  Has patient had joint replacement within last 12 months?  no  Does patient tend to be constipated or take laxatives? some  Does patient have a history of alcohol/drug use?  no  Is patient on Coumadin, Plavix and/or Aspirin? yes  Medications: see epic  Allergies: see epic  Medication Adjustment per Dr Laural Golden: warfarin 5 days  Procedure date & time: 06/12/16 at 1030

## 2016-05-21 DIAGNOSIS — E782 Mixed hyperlipidemia: Secondary | ICD-10-CM | POA: Diagnosis not present

## 2016-05-21 DIAGNOSIS — Z1389 Encounter for screening for other disorder: Secondary | ICD-10-CM | POA: Diagnosis not present

## 2016-05-21 DIAGNOSIS — I10 Essential (primary) hypertension: Secondary | ICD-10-CM | POA: Diagnosis not present

## 2016-05-21 DIAGNOSIS — L570 Actinic keratosis: Secondary | ICD-10-CM | POA: Diagnosis not present

## 2016-05-21 DIAGNOSIS — Z6836 Body mass index (BMI) 36.0-36.9, adult: Secondary | ICD-10-CM | POA: Diagnosis not present

## 2016-05-27 ENCOUNTER — Ambulatory Visit (INDEPENDENT_AMBULATORY_CARE_PROVIDER_SITE_OTHER): Payer: Medicare HMO | Admitting: *Deleted

## 2016-05-27 DIAGNOSIS — Z7901 Long term (current) use of anticoagulants: Secondary | ICD-10-CM

## 2016-05-27 DIAGNOSIS — I4892 Unspecified atrial flutter: Secondary | ICD-10-CM | POA: Diagnosis not present

## 2016-05-27 DIAGNOSIS — Z5181 Encounter for therapeutic drug level monitoring: Secondary | ICD-10-CM | POA: Diagnosis not present

## 2016-05-27 DIAGNOSIS — G450 Vertebro-basilar artery syndrome: Secondary | ICD-10-CM | POA: Diagnosis not present

## 2016-05-27 DIAGNOSIS — I4891 Unspecified atrial fibrillation: Secondary | ICD-10-CM

## 2016-05-27 DIAGNOSIS — I679 Cerebrovascular disease, unspecified: Secondary | ICD-10-CM

## 2016-05-27 LAB — POCT INR: INR: 2.2

## 2016-06-12 ENCOUNTER — Encounter (HOSPITAL_COMMUNITY)
Admission: RE | Disposition: A | Discharge status: Home / Self Care | Payer: Self-pay | Source: Ambulatory Visit | Attending: Internal Medicine

## 2016-06-12 ENCOUNTER — Encounter (HOSPITAL_COMMUNITY): Payer: Self-pay | Admitting: *Deleted

## 2016-06-12 ENCOUNTER — Ambulatory Visit (HOSPITAL_COMMUNITY)
Admission: RE | Admit: 2016-06-12 | Discharge: 2016-06-12 | Disposition: A | Discharge status: Home / Self Care | Payer: Medicare HMO | Source: Ambulatory Visit | Attending: Internal Medicine | Admitting: Internal Medicine

## 2016-06-12 DIAGNOSIS — D123 Benign neoplasm of transverse colon: Secondary | ICD-10-CM | POA: Insufficient documentation

## 2016-06-12 DIAGNOSIS — I739 Peripheral vascular disease, unspecified: Secondary | ICD-10-CM | POA: Diagnosis not present

## 2016-06-12 DIAGNOSIS — Z09 Encounter for follow-up examination after completed treatment for conditions other than malignant neoplasm: Secondary | ICD-10-CM | POA: Diagnosis not present

## 2016-06-12 DIAGNOSIS — Z1211 Encounter for screening for malignant neoplasm of colon: Secondary | ICD-10-CM | POA: Diagnosis present

## 2016-06-12 DIAGNOSIS — Z79899 Other long term (current) drug therapy: Secondary | ICD-10-CM | POA: Diagnosis not present

## 2016-06-12 DIAGNOSIS — Z87891 Personal history of nicotine dependence: Secondary | ICD-10-CM | POA: Diagnosis not present

## 2016-06-12 DIAGNOSIS — K219 Gastro-esophageal reflux disease without esophagitis: Secondary | ICD-10-CM | POA: Diagnosis not present

## 2016-06-12 DIAGNOSIS — D1779 Benign lipomatous neoplasm of other sites: Secondary | ICD-10-CM | POA: Insufficient documentation

## 2016-06-12 DIAGNOSIS — K5730 Diverticulosis of large intestine without perforation or abscess without bleeding: Secondary | ICD-10-CM | POA: Insufficient documentation

## 2016-06-12 DIAGNOSIS — Z86010 Personal history of colonic polyps: Secondary | ICD-10-CM | POA: Insufficient documentation

## 2016-06-12 DIAGNOSIS — Z800 Family history of malignant neoplasm of digestive organs: Secondary | ICD-10-CM

## 2016-06-12 DIAGNOSIS — H3530 Unspecified macular degeneration: Secondary | ICD-10-CM | POA: Diagnosis not present

## 2016-06-12 DIAGNOSIS — Z7901 Long term (current) use of anticoagulants: Secondary | ICD-10-CM | POA: Insufficient documentation

## 2016-06-12 DIAGNOSIS — I679 Cerebrovascular disease, unspecified: Secondary | ICD-10-CM | POA: Diagnosis not present

## 2016-06-12 DIAGNOSIS — K644 Residual hemorrhoidal skin tags: Secondary | ICD-10-CM | POA: Insufficient documentation

## 2016-06-12 DIAGNOSIS — D175 Benign lipomatous neoplasm of intra-abdominal organs: Secondary | ICD-10-CM | POA: Diagnosis not present

## 2016-06-12 HISTORY — DX: Cardiac arrhythmia, unspecified: I49.9

## 2016-06-12 HISTORY — DX: Personal history of urinary calculi: Z87.442

## 2016-06-12 HISTORY — PX: COLONOSCOPY: SHX5424

## 2016-06-12 HISTORY — DX: Peripheral vascular disease, unspecified: I73.9

## 2016-06-12 SURGERY — COLONOSCOPY
Anesthesia: Moderate Sedation

## 2016-06-12 MED ORDER — MEPERIDINE HCL 50 MG/ML IJ SOLN
INTRAMUSCULAR | Status: AC
  Filled 2016-06-12: qty 1

## 2016-06-12 MED ORDER — STERILE WATER FOR IRRIGATION IR SOLN
Status: DC | PRN
  Administered 2016-06-12: 2.5 mL

## 2016-06-12 MED ORDER — MIDAZOLAM HCL 5 MG/5ML IJ SOLN
INTRAMUSCULAR | Status: DC | PRN
  Administered 2016-06-12 (×2): 1 mg via INTRAVENOUS
  Administered 2016-06-12 (×2): 2 mg via INTRAVENOUS

## 2016-06-12 MED ORDER — MEPERIDINE HCL 50 MG/ML IJ SOLN
INTRAMUSCULAR | Status: DC | PRN
  Administered 2016-06-12 (×2): 25 mg via INTRAVENOUS

## 2016-06-12 MED ORDER — SODIUM CHLORIDE 0.9 % IV SOLN
INTRAVENOUS | Status: DC
  Administered 2016-06-12: 1000 mL via INTRAVENOUS

## 2016-06-12 MED ORDER — MIDAZOLAM HCL 5 MG/5ML IJ SOLN
INTRAMUSCULAR | Status: AC
  Filled 2016-06-12: qty 10

## 2016-06-12 NOTE — H&P (Addendum)
Daniel Morse is an 76 y.o. male.   Chief Complaint: Patient is here for colonoscopy. HPI: Patient is 76 year old Caucasian male with history of colonic polyps and is here for surveillance colonoscopy. He has had polyps on prior colonoscopy his last exam was 5 years ago with removal of 4 small polyps there were not adenomas. He denies abdominal pain change in bowel habits or rectal bleeding. Family history is positive for CRC in a second-degree relative(paternal uncle)  Past Medical History:  Diagnosis Date  . Atrial flutter ([place]) 06/2010  . Cerebrovascular disease 2008   Left CEA  . Chest pain    negative stress nuclear in 11/2002  . Chronic anticoagulation   . Dysrhythmia   . GERD (gastroesophageal reflux disease)    h/o esophagitis  . History of colonic polyps    Colonoscopy and polypectomy in 03/2011  . History of kidney stones   . Hyperlipidemia    Metabolic syndrome with increased triglycerides  . Hypertension   . Macular degeneration    legally blind  . Nephrolithiasis   . Peripheral vascular disease ([place])   . Tobacco abuse    50 pack years; 1/2 pack per day    Past Surgical History:  Procedure Laterality Date  . CAROTID ENDARTERECTOMY  2008   Left  . COLONOSCOPY  04/04/2011   Rogene Houston, MD;  multiple polyps ablated  . CYSTOSCOPY W/ URETERAL STENT PLACEMENT     and stone extraction    Family History  Problem Relation Age of Onset  . Heart attack Father   . Hypertension Sister   . Heart attack Brother   . Colon cancer Paternal Uncle   . Heart attack Brother    Social History:  reports that he quit smoking about 6 years ago. His smoking use included Cigarettes. He started smoking about 37 years ago. He has a 25.00 pack-year smoking history. He quit smokeless tobacco use about 6 years ago. He reports that he does not drink alcohol or use drugs.  Allergies:  Allergies  Allergen Reactions  . Simvastatin Other (See Comments)    Severe myalgias     Medications Prior to Admission  Medication Sig Dispense Refill  . diltiazem (CARDIZEM) 60 MG tablet Take 1 tablet (60 mg total) by mouth 2 (two) times daily. 90 tablet 3  . metoprolol (LOPRESSOR) 50 MG tablet Take 1 tablet (50 mg total) by mouth 2 (two) times daily. 180 tablet 3  . Omega-3 Fatty Acids (FISH OIL) 1000 MG CAPS Take 1,000 mg by mouth 2 (two) times daily.    . pantoprazole (PROTONIX) 40 MG tablet TAKE ONE TABLET BY MOUTH ONCE DAILY. 90 tablet 0  . Polyethyl Glycol-Propyl Glycol (SYSTANE OP) Apply 1 drop to eye daily as needed (allergies).    . pravastatin (PRAVACHOL) 10 MG tablet Take 10 mg by mouth at bedtime.     Marland Kitchen Specialty Vitamins Products (ICAPS LUTEIN-ZEAXANTHIN PO) Take 1 tablet by mouth 2 (two) times daily.     Marland Kitchen warfarin (COUMADIN) 2.5 MG tablet Take 1 1/2 tablets daily except 1 on Sundays, Tuesdays and Thursdays 135 tablet 3    No results found for this or any previous visit (from the past 48 hour(s)). No results found.  ROS  Blood pressure 128/83, pulse 94, temperature 97.8 F (36.6 C), temperature source Oral, resp. rate 18, height $RemoveBe'5\' 6"'MvBrFHJFB$  (1.676 m), weight 235 lb (106.6 kg), SpO2 99 %. Physical Exam  Constitutional: He appears well-developed and well-nourished.  HENT:  Mouth/Throat: Oropharynx is clear and moist.  Eyes: Conjunctivae are normal. No scleral icterus.  Neck: No thyromegaly present.  Cardiovascular:  Irregular rhythm normal S1 and S2. No murmur or gallop noted.  Respiratory: Effort normal and breath sounds normal.  GI:  Abdomen is full but soft and nontender without organomegaly or masses.  Musculoskeletal: He exhibits no edema.  Lymphadenopathy:    He has no cervical adenopathy.  Neurological: He is alert.  Skin: Skin is warm and dry.     Assessment/Plan History of colonic adenomas. Surveillance colonoscopy.  Hildred Laser, MD 06/12/2016, 10:47 AM

## 2016-06-12 NOTE — Op Note (Signed)
[facility] Patient Name: Daniel Morse Procedure Date: 06/12/2016 10:37 AM MRN: [id number] Date of Birth: [birth year] Attending MD: Hildred Laser , MD CSN: [id number] Age: 76 Admit Type: Outpatient Procedure:                Colonoscopy Indications:              High risk colon cancer surveillance: Personal                            history of colonic polyps Providers:                Hildred Laser, MD, Charlyne Petrin RN, RN, Zoila Shutter, Technologist Referring MD:             Halford Chessman, MD Medicines:                Meperidine 50 mg IV, Midazolam 6 mg IV Complications:            No immediate complications. Estimated Blood Loss:     Estimated blood loss was minimal. Procedure:                Pre-Anesthesia Assessment:                           - Prior to the procedure, a History and Physical                            was performed, and patient medications and                            allergies were reviewed. The patient's tolerance of                            previous anesthesia was also reviewed. The risks                            and benefits of the procedure and the sedation                            options and risks were discussed with the patient.                            All questions were answered, and informed consent                            was obtained. Prior Anticoagulants: The patient                            last took Coumadin (warfarin) 5 days prior to the                            procedure. ASA Grade Assessment: III - A patient  with severe systemic disease. After reviewing the                            risks and benefits, the patient was deemed in                            satisfactory condition to undergo the procedure.                           After obtaining informed consent, the colonoscope                            was passed under direct vision. Throughout the               procedure, the patient's blood pressure, pulse, and                            oxygen saturations were monitored continuously. The                            EC-[id number] ([id number]) scope was introduced through                            the anus and advanced to the the cecum, identified                            by appendiceal orifice and ileocecal valve. The                            colonoscopy was somewhat difficult due to a                            tortuous colon. Successful completion of the                            procedure was aided by changing the patient's                            position, using manual pressure and withdrawing and                            reinserting the scope. The patient tolerated the                            procedure well. The quality of the bowel                            preparation was excellent. The ileocecal valve,                            appendiceal orifice, and rectum were photographed. Scope In: 10:56:58 AM Scope Out: 11:18:36 AM Scope Withdrawal Time: 0 hours 5 minutes 11 seconds  Total Procedure Duration: 0 hours 21 minutes 38 seconds  Findings:  The perianal and digital rectal examinations were normal.      There was a medium-sized lipoma, in the proximal ascending colon.      A small polyp was found in the transverse colon. The polyp was sessile.       Biopsies were taken with a cold forceps for histology.      Multiple small and large-mouthed diverticula were found in the sigmoid       colon.      External hemorrhoids were found during retroflexion. The hemorrhoids       were small. Impression:               - Medium-sized lipoma in the proximal ascending                            colon.                           - One small polyp in the transverse colon. Biopsied.                           - Diverticulosis in the sigmoid colon.                           - External hemorrhoids. Moderate Sedation:      Moderate  (conscious) sedation was administered by the endoscopy nurse       and supervised by the endoscopist. The following parameters were       monitored: oxygen saturation, heart rate, blood pressure, CO2       capnography and response to care. Total physician intraservice time was       27 minutes. Recommendation:           - Patient has a contact number available for                            emergencies. The signs and symptoms of potential                            delayed complications were discussed with the                            patient. Return to normal activities tomorrow.                            Written discharge instructions were provided to the                            patient.                           - High fiber diet today.                           - Continue present medications.                           - Resume Coumadin (warfarin) at prior dose today.  Refer to [facility] for further adjustment of                            therapy.                           - Await pathology results.                           - Repeat colonoscopy in 7 years for surveillance. Procedure Code(s):        --- Professional ---                           [phone], Colonoscopy, flexible; with biopsy, single                            or multiple                           99152, Moderate sedation services provided by the                            same physician or other qualified health care                            professional performing the diagnostic or                            therapeutic service that the sedation supports,                            requiring the presence of an independent trained                            observer to assist in the monitoring of the                            patient's level of consciousness and physiological                            status; initial 15 minutes of intraservice time,                             patient age 5 years or older                           [phone], Moderate sedation services; each additional                            15 minutes intraservice time Diagnosis Code(s):        --- Professional ---                           Z86.010, Personal history of colonic polyps  D17.5, Benign lipomatous neoplasm of                            intra-abdominal organs                           D12.3, Benign neoplasm of transverse colon (hepatic                            flexure or splenic flexure)                           K64.4, Residual hemorrhoidal skin tags                           K57.30, Diverticulosis of large intestine without                            perforation or abscess without bleeding CPT copyright 2016 American Medical Association. All rights reserved. The codes documented in this report are preliminary and upon coder review may  be revised to meet current compliance requirements. Hildred Laser, MD Hildred Laser, MD 06/12/2016 11:28:13 AM This report has been signed electronically. Number of Addenda: 0

## 2016-06-12 NOTE — Discharge Instructions (Signed)
Colon Polyps Polyps are tissue growths inside the body. Polyps can grow in many places, including the large intestine (colon). A polyp may be a round bump or a mushroom-shaped growth. You could have one polyp or several. Most colon polyps are noncancerous (benign). However, some colon polyps can become cancerous over time. What are the causes? The exact cause of colon polyps is not known. What increases the risk? This condition is more likely to develop in people who:  Have a family history of colon cancer or colon polyps.  Are older than 33 or older than 45 if they are African American.  Have inflammatory bowel disease, such as ulcerative colitis or Crohn disease.  Are overweight.  Smoke cigarettes.  Do not get enough exercise.  Drink too much alcohol.  Eat a diet that is:  High in fat and red meat.  Low in fiber.  Had childhood cancer that was treated with abdominal radiation. What are the signs or symptoms? Most polyps do not cause symptoms. If you have symptoms, they may include:  Blood coming from your rectum when having a bowel movement.  Blood in your stool.The stool may look dark red or black.  A change in bowel habits, such as constipation or diarrhea. How is this diagnosed? This condition is diagnosed with a colonoscopy. This is a procedure that uses a lighted, flexible scope to look at the inside of your colon. How is this treated? Treatment for this condition involves removing any polyps that are found. Those polyps will then be tested for cancer. If cancer is found, your health care provider will talk to you about options for colon cancer treatment. Follow these instructions at home: Diet   Eat plenty of fiber, such as fruits, vegetables, and whole grains.  Eat foods that are high in calcium and vitamin D, such as milk, cheese, yogurt, eggs, liver, fish, and broccoli.  Limit foods high in fat, red meats, and processed meats, such as hot dogs, sausage,  bacon, and lunch meats.  Maintain a healthy weight, or lose weight if recommended by your health care provider. General instructions   Do not smoke cigarettes.  Do not drink alcohol excessively.  Keep all follow-up visits as told by your health care provider. This is important. This includes keeping regularly scheduled colonoscopies. Talk to your health care provider about when you need a colonoscopy.  Exercise every day or as told by your health care provider. Contact a health care provider if:  You have new or worsening bleeding during a bowel movement.  You have new or increased blood in your stool.  You have a change in bowel habits.  You unexpectedly lose weight. This information is not intended to replace advice given to you by your health care provider. Make sure you discuss any questions you have with your health care provider. Document Released: 11/15/2003 Document Revised: 07/27/2015 Document Reviewed: 01/09/2015 Elsevier Interactive Patient Education  2017 [place]. Colonoscopy, Adult, Care After This sheet gives you information about how to care for yourself after your procedure. Your health care provider may also give you more specific instructions. If you have problems or questions, contact your health care provider. What can I expect after the procedure? After the procedure, it is common to have:  A small amount of blood in your stool for 24 hours after the procedure.  Some gas.  Mild abdominal cramping or bloating. Follow these instructions at home: General instructions    For the first 24 hours  after the procedure:  Do not drive or use machinery.  Do not sign important documents.  Do not drink alcohol.  Do your regular daily activities at a slower pace than normal.  Eat soft, easy-to-digest foods.  Rest often.  Take over-the-counter or prescription medicines only as told by your health care provider.  It is up to you to get the results of  your procedure. Ask your health care provider, or the department performing the procedure, when your results will be ready. Relieving cramping and bloating   Try walking around when you have cramps or feel bloated.  Apply heat to your abdomen as told by your health care provider. Use a heat source that your health care provider recommends, such as a moist heat pack or a heating pad.  Place a towel between your skin and the heat source.  Leave the heat on for 20-30 minutes.  Remove the heat if your skin turns bright red. This is especially important if you are unable to feel pain, heat, or cold. You may have a greater risk of getting burned. Eating and drinking   Drink enough fluid to keep your urine clear or pale yellow.  Resume your normal diet as instructed by your health care provider. Avoid heavy or fried foods that are hard to digest.  Avoid drinking alcohol for as long as instructed by your health care provider. Contact a health care provider if:  You have blood in your stool 2-3 days after the procedure. Get help right away if:  You have more than a small spotting of blood in your stool.  You pass large blood clots in your stool.  Your abdomen is swollen.  You have nausea or vomiting.  You have a fever.  You have increasing abdominal pain that is not relieved with medicine. This information is not intended to replace advice given to you by your health care provider. Make sure you discuss any questions you have with your health care provider. Document Released: 10/03/2003 Document Revised: 11/13/2015 Document Reviewed: 05/02/2015 Elsevier Interactive Patient Education  2017 [place] usual medications including warfarin. INR check in one week. High fiber diet. No driving for 24 hours. Physician will call with biopsy results

## 2016-06-14 ENCOUNTER — Encounter (HOSPITAL_COMMUNITY): Payer: Self-pay | Admitting: Internal Medicine

## 2016-06-17 DIAGNOSIS — L570 Actinic keratosis: Secondary | ICD-10-CM | POA: Diagnosis not present

## 2016-06-17 DIAGNOSIS — C44319 Basal cell carcinoma of skin of other parts of face: Secondary | ICD-10-CM | POA: Diagnosis not present

## 2016-06-17 DIAGNOSIS — X32XXXA Exposure to sunlight, initial encounter: Secondary | ICD-10-CM | POA: Diagnosis not present

## 2016-06-19 ENCOUNTER — Ambulatory Visit (INDEPENDENT_AMBULATORY_CARE_PROVIDER_SITE_OTHER): Payer: Medicare HMO | Admitting: *Deleted

## 2016-06-19 DIAGNOSIS — I679 Cerebrovascular disease, unspecified: Secondary | ICD-10-CM

## 2016-06-19 DIAGNOSIS — Z5181 Encounter for therapeutic drug level monitoring: Secondary | ICD-10-CM

## 2016-06-19 LAB — POCT INR: INR: 1.6

## 2016-07-08 ENCOUNTER — Ambulatory Visit (INDEPENDENT_AMBULATORY_CARE_PROVIDER_SITE_OTHER): Payer: Medicare HMO | Admitting: *Deleted

## 2016-07-08 DIAGNOSIS — I679 Cerebrovascular disease, unspecified: Secondary | ICD-10-CM | POA: Diagnosis not present

## 2016-07-08 DIAGNOSIS — Z5181 Encounter for therapeutic drug level monitoring: Secondary | ICD-10-CM

## 2016-07-08 DIAGNOSIS — I4819 Other persistent atrial fibrillation: Secondary | ICD-10-CM

## 2016-07-08 DIAGNOSIS — I481 Persistent atrial fibrillation: Secondary | ICD-10-CM

## 2016-07-08 LAB — POCT INR: INR: 3

## 2016-07-15 DIAGNOSIS — Z08 Encounter for follow-up examination after completed treatment for malignant neoplasm: Secondary | ICD-10-CM | POA: Diagnosis not present

## 2016-07-15 DIAGNOSIS — Z85828 Personal history of other malignant neoplasm of skin: Secondary | ICD-10-CM | POA: Diagnosis not present

## 2016-07-15 DIAGNOSIS — C44319 Basal cell carcinoma of skin of other parts of face: Secondary | ICD-10-CM | POA: Diagnosis not present

## 2016-08-05 ENCOUNTER — Ambulatory Visit (INDEPENDENT_AMBULATORY_CARE_PROVIDER_SITE_OTHER): Payer: Medicare HMO | Admitting: *Deleted

## 2016-08-05 DIAGNOSIS — I679 Cerebrovascular disease, unspecified: Secondary | ICD-10-CM | POA: Diagnosis not present

## 2016-08-05 DIAGNOSIS — Z5181 Encounter for therapeutic drug level monitoring: Secondary | ICD-10-CM | POA: Diagnosis not present

## 2016-08-05 LAB — POCT INR: INR: 2.1

## 2016-08-19 DIAGNOSIS — C44319 Basal cell carcinoma of skin of other parts of face: Secondary | ICD-10-CM | POA: Diagnosis not present

## 2016-09-02 ENCOUNTER — Ambulatory Visit (INDEPENDENT_AMBULATORY_CARE_PROVIDER_SITE_OTHER): Payer: Medicare HMO | Admitting: *Deleted

## 2016-09-02 DIAGNOSIS — I679 Cerebrovascular disease, unspecified: Secondary | ICD-10-CM

## 2016-09-02 DIAGNOSIS — Z5181 Encounter for therapeutic drug level monitoring: Secondary | ICD-10-CM

## 2016-09-02 LAB — POCT INR: INR: 2.1

## 2016-09-18 DIAGNOSIS — C44319 Basal cell carcinoma of skin of other parts of face: Secondary | ICD-10-CM | POA: Diagnosis not present

## 2016-10-14 ENCOUNTER — Ambulatory Visit (INDEPENDENT_AMBULATORY_CARE_PROVIDER_SITE_OTHER): Payer: Medicare HMO | Admitting: *Deleted

## 2016-10-14 DIAGNOSIS — Z5181 Encounter for therapeutic drug level monitoring: Secondary | ICD-10-CM | POA: Diagnosis not present

## 2016-10-14 DIAGNOSIS — I679 Cerebrovascular disease, unspecified: Secondary | ICD-10-CM

## 2016-10-14 LAB — POCT INR: INR: 2.1

## 2016-10-18 ENCOUNTER — Telehealth: Payer: Self-pay | Admitting: Cardiology

## 2016-10-18 MED ORDER — DILTIAZEM HCL 60 MG PO TABS: 60.0000 mg | ORAL_TABLET | Freq: Two times a day (BID) | ORAL | 3 refills | Status: DC

## 2016-10-18 NOTE — Telephone Encounter (Signed)
°*  STAT* If patient is at the pharmacy, call can be transferred to refill team.   1. Which medications need to be refilled? (please list name of each medication and dose if known)  diltiazem (CARDIZEM) 60 MG tablet [[id number]]   2. Which pharmacy/location (including street and city if local pharmacy) is medication to be sent to? [facility]   3. Do they need a 30 day or 90 day supply? 90 day suppy for 1 year

## 2016-10-21 ENCOUNTER — Other Ambulatory Visit: Payer: Self-pay | Admitting: Cardiology

## 2016-10-21 MED ORDER — DILTIAZEM HCL 60 MG PO TABS: 60.0000 mg | ORAL_TABLET | Freq: Two times a day (BID) | ORAL | 3 refills | Status: DC

## 2016-10-21 NOTE — Telephone Encounter (Signed)
I escribed rx for cardizem 60 mg BID, #180 with RF:3 to [facility] for pt

## 2016-10-21 NOTE — Telephone Encounter (Signed)
Per phone call--pt only received 15 pills for his diltiazem (CARDIZEM) 60 MG tablet [[id number]]

## 2016-10-28 DIAGNOSIS — D485 Neoplasm of uncertain behavior of skin: Secondary | ICD-10-CM | POA: Diagnosis not present

## 2016-10-28 DIAGNOSIS — L905 Scar conditions and fibrosis of skin: Secondary | ICD-10-CM | POA: Diagnosis not present

## 2016-11-25 ENCOUNTER — Ambulatory Visit (INDEPENDENT_AMBULATORY_CARE_PROVIDER_SITE_OTHER): Payer: Medicare HMO | Admitting: *Deleted

## 2016-11-25 DIAGNOSIS — Z5181 Encounter for therapeutic drug level monitoring: Secondary | ICD-10-CM

## 2016-11-25 DIAGNOSIS — I679 Cerebrovascular disease, unspecified: Secondary | ICD-10-CM

## 2016-11-25 DIAGNOSIS — I4891 Unspecified atrial fibrillation: Secondary | ICD-10-CM | POA: Diagnosis not present

## 2016-11-25 LAB — POCT INR: INR: 2.4

## 2016-12-19 DIAGNOSIS — Z6836 Body mass index (BMI) 36.0-36.9, adult: Secondary | ICD-10-CM | POA: Diagnosis not present

## 2016-12-19 DIAGNOSIS — I4891 Unspecified atrial fibrillation: Secondary | ICD-10-CM | POA: Diagnosis not present

## 2016-12-19 DIAGNOSIS — Z125 Encounter for screening for malignant neoplasm of prostate: Secondary | ICD-10-CM | POA: Diagnosis not present

## 2016-12-19 DIAGNOSIS — Z23 Encounter for immunization: Secondary | ICD-10-CM | POA: Diagnosis not present

## 2016-12-19 DIAGNOSIS — E782 Mixed hyperlipidemia: Secondary | ICD-10-CM | POA: Diagnosis not present

## 2016-12-19 DIAGNOSIS — R972 Elevated prostate specific antigen [PSA]: Secondary | ICD-10-CM | POA: Diagnosis not present

## 2016-12-19 DIAGNOSIS — H3530 Unspecified macular degeneration: Secondary | ICD-10-CM | POA: Diagnosis not present

## 2016-12-19 DIAGNOSIS — R946 Abnormal results of thyroid function studies: Secondary | ICD-10-CM | POA: Diagnosis not present

## 2016-12-19 DIAGNOSIS — I10 Essential (primary) hypertension: Secondary | ICD-10-CM | POA: Diagnosis not present

## 2016-12-19 DIAGNOSIS — E785 Hyperlipidemia, unspecified: Secondary | ICD-10-CM | POA: Diagnosis not present

## 2017-01-15 ENCOUNTER — Ambulatory Visit (INDEPENDENT_AMBULATORY_CARE_PROVIDER_SITE_OTHER): Payer: Medicare HMO | Admitting: *Deleted

## 2017-01-15 DIAGNOSIS — I4891 Unspecified atrial fibrillation: Secondary | ICD-10-CM

## 2017-01-15 DIAGNOSIS — I679 Cerebrovascular disease, unspecified: Secondary | ICD-10-CM | POA: Diagnosis not present

## 2017-01-15 DIAGNOSIS — Z5181 Encounter for therapeutic drug level monitoring: Secondary | ICD-10-CM

## 2017-01-15 LAB — POCT INR: INR: 2.1

## 2017-01-20 ENCOUNTER — Other Ambulatory Visit (INDEPENDENT_AMBULATORY_CARE_PROVIDER_SITE_OTHER): Payer: Self-pay | Admitting: Internal Medicine

## 2017-01-30 DIAGNOSIS — Z6837 Body mass index (BMI) 37.0-37.9, adult: Secondary | ICD-10-CM | POA: Diagnosis not present

## 2017-01-30 DIAGNOSIS — I6523 Occlusion and stenosis of bilateral carotid arteries: Secondary | ICD-10-CM | POA: Diagnosis not present

## 2017-01-30 DIAGNOSIS — E039 Hypothyroidism, unspecified: Secondary | ICD-10-CM | POA: Diagnosis not present

## 2017-02-04 ENCOUNTER — Other Ambulatory Visit: Payer: Self-pay | Admitting: Cardiology

## 2017-02-21 DIAGNOSIS — I6523 Occlusion and stenosis of bilateral carotid arteries: Secondary | ICD-10-CM | POA: Diagnosis not present

## 2017-03-05 ENCOUNTER — Ambulatory Visit (INDEPENDENT_AMBULATORY_CARE_PROVIDER_SITE_OTHER): Payer: Medicare HMO | Admitting: *Deleted

## 2017-03-05 DIAGNOSIS — Z5181 Encounter for therapeutic drug level monitoring: Secondary | ICD-10-CM

## 2017-03-05 DIAGNOSIS — I679 Cerebrovascular disease, unspecified: Secondary | ICD-10-CM

## 2017-03-05 LAB — POCT INR: INR: 2

## 2017-03-24 ENCOUNTER — Emergency Department (HOSPITAL_COMMUNITY): Payer: Medicare HMO

## 2017-03-24 ENCOUNTER — Other Ambulatory Visit: Payer: Self-pay

## 2017-03-24 ENCOUNTER — Encounter (HOSPITAL_COMMUNITY): Payer: Self-pay | Admitting: *Deleted

## 2017-03-24 ENCOUNTER — Emergency Department (HOSPITAL_COMMUNITY)
Admission: EM | Admit: 2017-03-24 | Discharge: 2017-03-24 | Disposition: A | Discharge status: Home / Self Care | Payer: Medicare HMO | Attending: Emergency Medicine | Admitting: Emergency Medicine

## 2017-03-24 DIAGNOSIS — I10 Essential (primary) hypertension: Secondary | ICD-10-CM | POA: Insufficient documentation

## 2017-03-24 DIAGNOSIS — R791 Abnormal coagulation profile: Secondary | ICD-10-CM | POA: Insufficient documentation

## 2017-03-24 DIAGNOSIS — K611 Rectal abscess: Secondary | ICD-10-CM | POA: Insufficient documentation

## 2017-03-24 DIAGNOSIS — Z7901 Long term (current) use of anticoagulants: Secondary | ICD-10-CM | POA: Diagnosis not present

## 2017-03-24 DIAGNOSIS — K610 Anal abscess: Secondary | ICD-10-CM | POA: Diagnosis not present

## 2017-03-24 DIAGNOSIS — K6289 Other specified diseases of anus and rectum: Secondary | ICD-10-CM | POA: Diagnosis not present

## 2017-03-24 DIAGNOSIS — Z87891 Personal history of nicotine dependence: Secondary | ICD-10-CM | POA: Insufficient documentation

## 2017-03-24 LAB — CBC
HCT: 45.5 % (ref 39.0–52.0)
Hemoglobin: 15.2 g/dL (ref 13.0–17.0)
MCH: 31.7 pg (ref 26.0–34.0)
MCHC: 33.4 g/dL (ref 30.0–36.0)
MCV: 94.8 fL (ref 78.0–100.0)
Platelets: 191 10*3/uL (ref 150–400)
RBC: 4.8 MIL/uL (ref 4.22–5.81)
RDW: 14.2 % (ref 11.5–15.5)
WBC: 11.9 10*3/uL — ABNORMAL HIGH (ref 4.0–10.5)

## 2017-03-24 LAB — BASIC METABOLIC PANEL
Anion gap: 12 (ref 5–15)
BUN: 19 mg/dL (ref 6–20)
CO2: 26 mmol/L (ref 22–32)
CREATININE: 1.01 mg/dL (ref 0.61–1.24)
Calcium: 9 mg/dL (ref 8.9–10.3)
Chloride: 105 mmol/L (ref 101–111)
GFR calc Af Amer: >60 mL/min (ref >60)
GLUCOSE: 94 mg/dL (ref 65–99)
Potassium: 3.8 mmol/L (ref 3.5–5.1)
Sodium: 143 mmol/L (ref 135–145)

## 2017-03-24 LAB — PROTIME-INR
INR: 1.58
PROTHROMBIN TIME: 18.7 s — AB (ref 11.4–15.2)

## 2017-03-24 MED ORDER — POVIDONE-IODINE 10 % EX SOLN
1.0000 | Freq: Once | CUTANEOUS | Status: AC
Start: 2017-03-24 — End: 2017-03-24
  Administered 2017-03-24: 18:00:00 via TOPICAL

## 2017-03-24 MED ORDER — LIDOCAINE HCL (PF) 1 % IJ SOLN
INTRAMUSCULAR | Status: AC
  Administered 2017-03-24: 5 mL via INTRADERMAL
  Filled 2017-03-24: qty 10

## 2017-03-24 MED ORDER — POVIDONE-IODINE 10 % EX SOLN
CUTANEOUS | Status: AC
  Filled 2017-03-24: qty 15

## 2017-03-24 MED ORDER — HYDROCODONE-ACETAMINOPHEN 5-325 MG PO TABS: 2.0000 | ORAL_TABLET | Freq: Four times a day (QID) | ORAL | 0 refills | Status: DC | PRN

## 2017-03-24 MED ORDER — IOPAMIDOL (ISOVUE-300) INJECTION 61%
100.0000 mL | Freq: Once | INTRAVENOUS | Status: AC | PRN
  Administered 2017-03-24: 100 mL via INTRAVENOUS

## 2017-03-24 MED ORDER — LIDOCAINE HCL (PF) 1 % IJ SOLN
5.0000 mL | Freq: Once | INTRAMUSCULAR | Status: AC
  Administered 2017-03-24: 5 mL via INTRADERMAL

## 2017-03-24 MED ORDER — AMOXICILLIN-POT CLAVULANATE 875-125 MG PO TABS: 1.0000 | ORAL_TABLET | Freq: Two times a day (BID) | ORAL | 0 refills | Status: DC

## 2017-03-24 NOTE — Discharge Instructions (Signed)
Do not take any Coumadin (warfarin) today .  Dr. Constance Haw will advise you as to when you should start Coumadin again.  Your Coumadin level(INR) is low today at 1.54.  The doctor prescribing Coumadin may need to adjust your dose.  Take the antibiotic as prescribed.  Take Tylenol for mild pain or the pain medicine prescribed for bad pain.  Do not take Tylenol together with the pain medicine prescribed as the combination can be dangerous to your liver.  Your blood pressure should be rechecked within the next 3 weeks.  Today's was mildly elevated at 154/93.  Appointment has been scheduled for you for tomorrow at Dr. Constance Haw office at 9:30 AM

## 2017-03-24 NOTE — Consult Note (Signed)
[facility] Consult  Reason for Consult: Perirectal abscess  Referring Physician:  Dr. Winfred Leeds   Chief Complaint    Abscess      Daniel Morse is a 77 y.o. male.  HPI: Daniel Morse is a very nice 77 yo who comes in complaining of a "knot" on his buttock around his anus that he first noted on Thursday. He says that the area has become larger and more tender overtime. He has never had any perirectal abscesses but has had "boils" on other parts of his body as a kid. He takes warfarin for A flutter and a level that normally runs about 2-2.2.  He says that he has not had any fevers or chills, but it has been uncomfortable at the perirectal region, and he is afraid it is going to make constipated.  He has had some issues recently with constipation, having hard stools daily and is taking a stool softener which has helped.  He has had a colonoscopy in 2017 that demonstrated polyp that was removed and a ascending colon lipoma.    Past Medical History:  Diagnosis Date  . Atrial flutter ([place]) 06/2010  . Cerebrovascular disease 2008   Left CEA  . Chest pain    negative stress nuclear in 11/2002  . Chronic anticoagulation   . Dysrhythmia   . GERD (gastroesophageal reflux disease)    h/o esophagitis  . History of colonic polyps    Colonoscopy and polypectomy in 03/2011  . History of kidney stones   . Hyperlipidemia    Metabolic syndrome with increased triglycerides  . Hypertension   . Macular degeneration    legally blind  . Nephrolithiasis   . Peripheral vascular disease ([place])   . Tobacco abuse    50 pack years; 1/2 pack per day    Past Surgical History:  Procedure Laterality Date  . CAROTID ENDARTERECTOMY  2008   Left  . COLONOSCOPY  04/04/2011   Rogene Houston, MD;  multiple polyps ablated  . COLONOSCOPY N/A 06/12/2016   Procedure: COLONOSCOPY;  Surgeon: Rogene Houston, MD;  Location: [facility];  Service: Endoscopy;  Laterality: N/A;  1030  . CYSTOSCOPY  W/ URETERAL STENT PLACEMENT     and stone extraction    Family History  Problem Relation Age of Onset  . Heart attack Father   . Hypertension Sister   . Heart attack Brother   . Colon cancer Paternal Uncle   . Heart attack Brother     Social History   Tobacco Use  . Smoking status: Former Smoker    Packs/day: 0.50    Years: 50.00    Pack years: 25.00    Types: Cigarettes    Start date: 11/06/1978    Last attempt to quit: 11/05/2009    Years since quitting: 7.3  . Smokeless tobacco: Former Systems developer    Quit date: 06/01/2010  Substance Use Topics  . Alcohol use: No    Alcohol/week: 0.0 oz    Comment: occasionally  . Drug use: No    No current facility-administered medications for this encounter.    Current Outpatient Medications  Medication Sig Dispense Refill Last Dose  . diltiazem (CARDIZEM) 60 MG tablet Take 1 tablet (60 mg total) by mouth 2 (two) times daily. 180 tablet 3   . metoprolol (LOPRESSOR) 50 MG tablet Take 1 tablet (50 mg total) by mouth 2 (two) times daily. 180 tablet 3 06/12/2016 at 0700  . Omega-3 Fatty Acids (FISH OIL)  1000 MG CAPS Take 1,000 mg by mouth 2 (two) times daily.   06/10/2016  . pantoprazole (PROTONIX) 40 MG tablet TAKE ONE TABLET BY MOUTH ONCE DAILY 90 tablet 3   . Polyethyl Glycol-Propyl Glycol (SYSTANE OP) Apply 1 drop to eye daily as needed (allergies).   06/11/2016 at Unknown time  . pravastatin (PRAVACHOL) 10 MG tablet Take 10 mg by mouth at bedtime.    06/10/2016  . Specialty Vitamins Products (ICAPS LUTEIN-ZEAXANTHIN PO) Take 1 tablet by mouth 2 (two) times daily.    06/10/2016  . warfarin (COUMADIN) 2.5 MG tablet Take 1 tablet daily except 1 1/2 tablets on Tuesdays, Thursdays and Saturdays 114 tablet 4     Allergies Allergies  Allergen Reactions  . Simvastatin Other (See Comments)    Severe myalgias     ROS:  A comprehensive review of systems was negative except for: Gastrointestinal: positive for perirectal pain, constipation  Blood  pressure (!) 154/88, pulse 96, temperature 98.4 F (36.9 C), temperature source Oral, resp. rate 18, height $RemoveBe'5\' 6"'fEdyVarEF$  (1.676 m), weight 235 lb (106.6 kg), SpO2 98 %. Physical Exam  Constitutional: He is oriented to person, place, and time and well-developed, well-nourished, and in no distress.  HENT:  Head: Normocephalic.  Eyes: Pupils are equal, round, and reactive to light.  Neck: Normal range of motion.  Cardiovascular: Normal rate.  Pulmonary/Chest: Effort normal.  Abdominal: Soft. He exhibits no distension. There is no tenderness.  Genitourinary:  Genitourinary Comments: No external hemorrhoids, left perirectal area with approximately 1.5cm fluctuant area with erythema, no drainage, internal exam without gross lesions or blood, tender to palpation and possible fluctuance extending superiorly   Musculoskeletal: Normal range of motion.  Neurological: He is alert and oriented to person, place, and time.  Skin: Skin is warm and dry.  Psychiatric: Mood, memory, affect and judgment normal.  Vitals reviewed.   Results: None  Assessment & Plan:  Daniel Morse is a 77 y.o. male with a perirectal abscess of unknown extent at this time on warfarin for A flutter.  The patient overall does not look toxic but is uncomfortable.    -Labs ordered including INR, would prefer INR < 1.5 for drainage but will discuss with patient once extent of abscess determined  -Antibiotics IV  -CT pelvis with IV contrast to assess the depth of the abscess to determine feasibility of drainage at bedside pending INR   All questions were answered to the satisfaction of the patient and family including need for CT scan to determine how extensive the abscess is and the depth, and the risk of bleeding due to the warfarin, and the possibility of observation overnight if INR levels are elevated and CT scan shows deep abscess.   Updated Dr. Winfred Leeds.  Virl Cagey 03/24/2017, 4:13 PM

## 2017-03-24 NOTE — Progress Notes (Signed)
INR 1.58 CT with superficial abscess  Incision and Drainage done at bedside with gauze packing. Area cleaned with betadine, 1% lidocaine used to anesthetize.  Radial incision made, purulence evacuated and saline irrigated. Gauze packed.  Plan to see patient in clinic in Am @ 930. Hold coumadin Augment for 7 days Pain meds from ED  Some bleeding ok, if gauze falls out then just replace the gauze between butt cheeks, no need to attempt repacking. If bleeds significantly, go to ED.  Curlene Labrum, MD [facility] [address] [place], [place] [phone] (office)

## 2017-03-24 NOTE — ED Triage Notes (Signed)
Abscess on buttock onset last week

## 2017-03-24 NOTE — ED Provider Notes (Signed)
[facility] EMERGENCY DEPARTMENT Provider Note   CSN: [id number] Arrival date & time: 03/24/17  1436     History   Chief Complaint Chief Complaint  Patient presents with  . Abscess    HPI Daniel Morse is a 77 y.o. male.  HPI Complains of left-sided buttock pain near rectum, onset 4 days ago.  Pain is exacerbated by sitting or with bowel movement.  Denies fever denies abdominal pain.  Pain improved by standing.  No treatment prior to coming here.  No abdominal pain no nausea or vomiting.  No other associated symptoms Past Medical History:  Diagnosis Date  . Atrial flutter ([place]) 06/2010  . Cerebrovascular disease 2008   Left CEA  . Chest pain    negative stress nuclear in 11/2002  . Chronic anticoagulation   . Dysrhythmia   . GERD (gastroesophageal reflux disease)    h/o esophagitis  . History of colonic polyps    Colonoscopy and polypectomy in 03/2011  . History of kidney stones   . Hyperlipidemia    Metabolic syndrome with increased triglycerides  . Hypertension   . Macular degeneration    legally blind  . Nephrolithiasis   . Peripheral vascular disease ([place])   . Tobacco abuse    50 pack years; 1/2 pack per day    Patient Active Problem List   Diagnosis Date Noted  . Family hx of colon cancer 04/19/2016  . History of colonic polyps 04/19/2016  . Encounter for therapeutic drug monitoring 04/14/2013  . Hypokalemia 12/03/2012  . Peripheral vascular disease ([place]) 11/05/2012  . Chronic anticoagulation   . Tobacco abuse   . Cerebrovascular disease   . Nephrolithiasis   . Hyperlipidemia   . Hypertension   . GERD (gastroesophageal reflux disease)   . Atrial fibrillation     Past Surgical History:  Procedure Laterality Date  . CAROTID ENDARTERECTOMY  2008   Left  . COLONOSCOPY  04/04/2011   Rogene Houston, MD;  multiple polyps ablated  . COLONOSCOPY N/A 06/12/2016   Procedure: COLONOSCOPY;  Surgeon: Rogene Houston, MD;  Location: [facility];  Service:  Endoscopy;  Laterality: N/A;  1030  . CYSTOSCOPY W/ URETERAL STENT PLACEMENT     and stone extraction       Home Medications    Prior to Admission medications   Medication Sig Start Date End Date Taking? Authorizing Provider  diltiazem (CARDIZEM) 60 MG tablet Take 1 tablet (60 mg total) by mouth 2 (two) times daily. 10/21/16   Arnoldo Lenis, MD  metoprolol (LOPRESSOR) 50 MG tablet Take 1 tablet (50 mg total) by mouth 2 (two) times daily. 04/25/16   Arnoldo Lenis, MD  Omega-3 Fatty Acids (FISH OIL) 1000 MG CAPS Take 1,000 mg by mouth 2 (two) times daily.    [provider]  pantoprazole (PROTONIX) 40 MG tablet TAKE ONE TABLET BY MOUTH ONCE DAILY 01/20/17   [name], Mechele Dawley, MD  Polyethyl Glycol-Propyl Glycol (SYSTANE OP) Apply 1 drop to eye daily as needed (allergies).    [provider]  pravastatin (PRAVACHOL) 10 MG tablet Take 10 mg by mouth at bedtime.     [provider]  Specialty Vitamins Products (ICAPS LUTEIN-ZEAXANTHIN PO) Take 1 tablet by mouth 2 (two) times daily.     [provider]  warfarin (COUMADIN) 2.5 MG tablet Take 1 tablet daily except 1 1/2 tablets on Tuesdays, Thursdays and Saturdays 02/04/17   Arnoldo Lenis, MD    Family  History Family History  Problem Relation Age of Onset  . Heart attack Father   . Hypertension Sister   . Heart attack Brother   . Colon cancer Paternal Uncle   . Heart attack Brother     Social History Social History   Tobacco Use  . Smoking status: Former Smoker    Packs/day: 0.50    Years: 50.00    Pack years: 25.00    Types: Cigarettes    Start date: 11/06/1978    Last attempt to quit: 11/05/2009    Years since quitting: 7.3  . Smokeless tobacco: Former Systems developer    Quit date: 06/01/2010  Substance Use Topics  . Alcohol use: No    Alcohol/week: 0.0 oz    Comment: occasionally  . Drug use: No     Allergies   Simvastatin   Review of Systems Review of Systems  Constitutional:  Negative.   HENT: Negative.   Respiratory: Negative.   Cardiovascular: Negative.   Gastrointestinal: Positive for rectal pain.  Musculoskeletal: Negative.   Skin: Negative.   Neurological: Negative.   Hematological: Bruises/bleeds easily.  Psychiatric/Behavioral: Negative.   All other systems reviewed and are negative.    Physical Exam Updated Vital Signs BP (!) 154/88   Pulse 96   Temp 98.4 F (36.9 C) (Oral)   Resp 18   Ht $R'5\' 6"'oV$  (1.676 m)   Wt 106.6 kg (235 lb)   SpO2 98%   BMI 37.93 kg/m   Physical Exam  Constitutional: He appears well-developed and well-nourished.  HENT:  Head: Normocephalic and atraumatic.  Eyes: Conjunctivae are normal. Pupils are equal, round, and reactive to light.  Neck: Neck supple. No tracheal deviation present. No thyromegaly present.  Cardiovascular: Normal rate and regular rhythm.  No murmur heard. Pulmonary/Chest: Effort normal and breath sounds normal.  Abdominal: Soft. Bowel sounds are normal. He exhibits no distension. There is no tenderness.  Genitourinary:  Genitourinary Comments: Left buttock there is a 3 cm fluctuant area immediately lateral to the rectum which is red and tender.  No obvious drainage.  Musculoskeletal: Normal range of motion. He exhibits no edema or tenderness.  Neurological: He is alert. Coordination normal.  Skin: Skin is warm and dry. No rash noted.  Psychiatric: He has a normal mood and affect.  Nursing note and vitals reviewed.    ED Treatments / Results  Labs (all labs ordered are listed, but only abnormal results are displayed) Labs Reviewed  BASIC METABOLIC PANEL  CBC  PROTIME-INR    EKG  EKG Interpretation None       Radiology No results found.  Procedures Procedures (including critical care time)  Medications Ordered in ED Medications - No data to display  Results for orders placed or performed during the hospital encounter of 51/70/01  Basic metabolic panel  Result Value Ref  Range   Sodium 143 135 - 145 mmol/L   Potassium 3.8 3.5 - 5.1 mmol/L   Chloride 105 101 - 111 mmol/L   CO2 26 22 - 32 mmol/L   Glucose, Bld 94 65 - 99 mg/dL   BUN 19 6 - 20 mg/dL   Creatinine, Ser 1.01 0.61 - 1.24 mg/dL   Calcium 9.0 8.9 - 10.3 mg/dL   GFR calc non Af Amer >60 >60 mL/min   GFR calc Af Amer >60 >60 mL/min   Anion gap 12 5 - 15  CBC  Result Value Ref Range   WBC 11.9 (H) 4.0 - 10.5 K/uL   RBC  4.80 4.22 - 5.81 MIL/uL   Hemoglobin 15.2 13.0 - 17.0 g/dL   HCT 45.5 39.0 - 52.0 %   MCV 94.8 78.0 - 100.0 fL   MCH 31.7 26.0 - 34.0 pg   MCHC 33.4 30.0 - 36.0 g/dL   RDW 14.2 11.5 - 15.5 %   Platelets 191 150 - 400 K/uL  Protime-INR  Result Value Ref Range   Prothrombin Time 18.7 (H) 11.4 - 15.2 seconds   INR 1.58    Ct Pelvis W Contrast  Result Date: 03/24/2017 CLINICAL DATA:  Anorectal ab assess EXAM: CT PELVIS WITH CONTRAST TECHNIQUE: Multidetector CT imaging of the pelvis was performed using the standard protocol following the bolus administration of intravenous contrast. Sagittal and coronal MPR images reconstructed from axial data set. CONTRAST:  178mL ISOVUE-300 IOPAMIDOL (ISOVUE-300) INJECTION 61% IV. No oral contrast administered. COMPARISON:  02/23/2014 FINDINGS: Urinary Tract:  Unremarkable bladder and distal ureters. Bowel: Appendix not visualized. Mild sigmoid diverticulosis. No CT evidence of diverticulitis. Remaining visualized bowel loops unremarkable. Vascular/Lymphatic: Atherosclerotic calcifications aorta and iliac arteries through femoral arterial bifurcations bilaterally no adenopathy. Few normal sized pelvic lymph nodes visualized. Reproductive: Mild prostatic enlargement, 5.5 x 3.6 x 4.1 cm. Unremarkable seminal vesicles. Other: No free air or free fluid. No inguinal hernias. Small subcutaneous collection is identified at the medial aspect of the LEFT buttock adjacent to the buttock crease, 2.8 x 1.3 x 2.0 cm consistent with a perianal abscess. Mild  associated skin thickening and subcutaneous infiltration at the medial LEFT buttock. No additional fluid collections. Perirectal fat planes clear. Musculoskeletal: Demineralized but otherwise unremarkable. IMPRESSION: Small perianal abscess 2.8 x 1.3 x 2.0 cm with surrounding edema. Mild prostatic enlargement. Minimal sigmoid diverticulosis. Aortic Atherosclerosis (ICD10-I70.0). Electronically Signed   By: Lavonia Dana M.D.   On: 03/24/2017 17:37   Initial Impression / Assessment and Plan / ED Course  I have reviewed the triage vital signs and the nursing notes.  Pertinent labs & imaging results that were available during my care of the patient were reviewed by me and considered in my medical decision making (see chart for details).     Declines pain medicine.  Dr. Constance Haw, general surgeon consulted and saw patient in the emergency department.  DrBridges has not drained abscess in the emergency department and made arrangements for follow-up tomorrow in her office.  She has thyroid prescription for Augmentin and Norco which I have. [place] Controlled Substance reporting System queried. Instructed to hold Coumadin today.  Blood pressure recheck 3 weeks  Final Clinical Impressions(s) / ED Diagnoses  Diagnosis#1perianal abscess #2 subtherapeutic INR #3 elevated blood pressure Final diagnoses:  None    ED Discharge Orders    None       Orlie Dakin, MD 03/24/17 [phone]

## 2017-03-25 ENCOUNTER — Ambulatory Visit (INDEPENDENT_AMBULATORY_CARE_PROVIDER_SITE_OTHER): Payer: Self-pay | Admitting: General Surgery

## 2017-03-25 ENCOUNTER — Encounter: Payer: Self-pay | Admitting: General Surgery

## 2017-03-25 VITALS — BP 128/91 | HR 95 | Temp 97.1°F | Resp 18 | Ht 66.0 in | Wt 230.0 lb

## 2017-03-25 DIAGNOSIS — K611 Rectal abscess: Secondary | ICD-10-CM

## 2017-03-25 NOTE — Patient Instructions (Signed)
How to Take a Sitz Bath A sitz bath is a warm water bath that is taken while you are sitting down. The water should only come up to your hips and should cover your buttocks. Your health care provider may recommend a sitz bath to help you:  Clean the lower part of your body, including your genital area.  With itching.  With pain.  With sore muscles or muscles that tighten or spasm.  How to take a sitz bath Take 3-4 sitz baths per day or as told by your health care provider. 1. Partially fill a bathtub with warm water. You will only need the water to be deep enough to cover your hips and buttocks when you are sitting in it. 2. If your health care provider told you to put medicine in the water, follow the directions exactly. 3. Sit in the water and open the tub drain a little. 4. Turn on the warm water again to keep the tub at the correct level. Keep the water running constantly. 5. Soak in the water for 15-20 minutes or as told by your health care provider. 6. After the sitz bath, pat the affected area dry first. Do not rub it. 7. Be careful when you stand up after the sitz bath because you may feel dizzy.  Contact a health care provider if:  Your symptoms get worse. Do not continue with sitz baths if your symptoms get worse.  You have new symptoms. Do not continue with sitz baths until you talk with your health care provider. This information is not intended to replace advice given to you by your health care provider. Make sure you discuss any questions you have with your health care provider. Document Released: 11/11/2003 Document Revised: 07/19/2015 Document Reviewed: 02/16/2014 Elsevier Interactive Patient Education  2018 Elsevier Inc.  Perianal Abscess An abscess is an infected area that is filled with pus. A perianal abscess occurs in the perineum, which is the area between the anus and the scrotum in males and between the anus and the vagina in females. Perianal abscesses can  vary in size. Without treatment, a perianal abscess can become larger and cause other problems. What are the causes? This condition is caused by:  Waste from damaged or dead tissue (debris) that plugs up glands in the perineum. When this happens, an abscess may form.  Infections of the perineum.  What are the signs or symptoms? Common symptoms of this condition include:  Swelling and redness in the area of the abscess. The redness may go beyond the abscess and appear as a red streak on the skin.  Pain in the area of the abscess, including pain when sitting, walking, or passing stool.  Other possible symptoms include:  A visible, painful lump, or a lump that can be felt when touched.  Bleeding or pus-like discharge from the area.  Fever.  General weakness.  How is this diagnosed? This condition is diagnosed based on your medical history and a physical exam of the affected area.  This may involve examining the rectal area with a gloved hand (digital rectal exam).  Sometimes, the health care provider needs to look into the rectum using a probe or a scope.  For women, it may require a careful vaginal exam.  How is this treated? Treatment for this condition may include:  Making a cut (incision) in the abscess to drain the pus. This can sometimes be done in your health care provider's office or an emergency department after  you are given medicine to numb the area (local anesthetic).  Surgery to drain the abscess. This is for larger or deeper abscesses.  Antibiotic medicines, if there is infection in the surrounding tissue (cellulitis).  Having gauze packed into the abscess to continue draining the area.  Frequent baths in warm water that is deep enough to cover your hips and buttocks (sitz baths). These help the wound heal and they make the abscess less likely to come back.  Follow these instructions at home: Medicines  Take over-the-counter and prescription medicines for  pain, fever, or discomfort only as told by your health care provider.  If you were prescribed an antibiotic medicine, use it as told by your health care provider. Do not stop using the antibiotic even if you start to feel better.  Do not drive or use heavy machinery while taking prescription pain medicine. Wound care   Keep the skin around the wound clean and dry. Avoid cleaning the area too much.  Avoid scratching the wound.  Avoid using colored or perfumed toilet papers.  Take a sitz bath 3-4 times a day and after bowel movements. This will help reduce pain and swelling.  If directed, apply ice to the injured area: ? Put ice in a plastic bag. ? Place a towel between your skin and the bag. ? Leave the ice on for 20 minutes, 2-3 times a day.  Check your incision area every day for signs of infection. Check for: ? More redness, swelling, or pain. ? More fluid or blood. ? Warmth. ? Pus or a bad smell. Gauze  If gauze was used in the abscess, follow instructions from your health care provider about removing or changing the gauze. It can usually be removed in 2-3 days.  Wash your hands with soap and water before you remove or change your gauze. If soap and water are not available, use hand sanitizer.  If one or more drains were placed in the abscess cavity, be careful not to pull at them. Your health care provider will tell you how long they need to remain in place. General instructions  Keep all follow-up visits as told by your health care provider. This is important. Contact a health care provider if:  You have trouble passing stool or passing urine.  Your pain or swelling in the affected area does not seem to be getting better.  The gauze packing or the drains come out before the planned time. Get help right away if:  You have problems moving or using your legs.  You have severe or increasing pain.  Your swelling in the affected area suddenly gets worse.  You have a  large increase in bleeding or passing of pus.  You have chills or a fever. This information is not intended to replace advice given to you by your health care provider. Make sure you discuss any questions you have with your health care provider. Document Released: 03/27/2006 Document Revised: 09/08/2015 Document Reviewed: 07/31/2015 Elsevier Interactive Patient Education  [employer].

## 2017-03-25 NOTE — Progress Notes (Signed)
[facility] Note   HPI:  77 y.o. Male presents to clinic for post-op follow-up evaluation after an I&D at the bedside of perianal abscess yesterday. He is a little sore, but otherwise is doing well. Held coumadin this AM.  He feels better. No bleeding overnight.   Review of Systems:  Tenderness in the area Some drainage/ bloody drainage No major bleeding All other review of systems: otherwise negative   Vital Signs:  BP (!) 128/91   Pulse 95   Temp (!) 97.1 F (36.2 C)   Resp 18   Ht $R'5\' 6"'Tu$  (1.676 m)   Wt 230 lb (104.3 kg)   BMI 37.12 kg/m    Physical Exam:  Physical Exam  Constitutional: He is well-developed, well-nourished, and in no distress.  Cardiovascular: Normal rate.  Pulmonary/Chest: Effort normal.  Genitourinary:  Genitourinary Comments: Left perianal abscess incision with blood stained packing, removed, no bleeding or drainage, less indurated, gauze between buttocks applied   Vitals reviewed.   Laboratory studies: Non   Imaging:  None   Assessment:  77 y.o. yo Male with a perianal abscess drained at the bedside in the ED yesterday evening. Doing well.  Plan:  - Finish antibiotics   - PRN pain as needed, has not filled yet - Sitz bath information given to the patient, recommended using feminine napkins for the drainage, does not need to pack - Start back coumadin on Thursday AM - PRN follow up  All of the above recommendations were discussed with the patient and patient's family, and all of patient's and family's questions were answered to their expressed satisfaction.  Curlene Labrum, MD [facility] [address] [place], [place] [id number] [phone] (office)

## 2017-04-16 ENCOUNTER — Ambulatory Visit (INDEPENDENT_AMBULATORY_CARE_PROVIDER_SITE_OTHER): Payer: Medicare HMO | Admitting: *Deleted

## 2017-04-16 DIAGNOSIS — I4891 Unspecified atrial fibrillation: Secondary | ICD-10-CM | POA: Diagnosis not present

## 2017-04-16 DIAGNOSIS — I679 Cerebrovascular disease, unspecified: Secondary | ICD-10-CM | POA: Diagnosis not present

## 2017-04-16 DIAGNOSIS — Z5181 Encounter for therapeutic drug level monitoring: Secondary | ICD-10-CM | POA: Diagnosis not present

## 2017-04-16 LAB — POCT INR: INR: 1.7

## 2017-04-16 MED ORDER — METOPROLOL TARTRATE 50 MG PO TABS: 50.0000 mg | ORAL_TABLET | Freq: Two times a day (BID) | ORAL | 3 refills | Status: DC

## 2017-04-16 NOTE — Patient Instructions (Signed)
Take coumadin 2 tablets tonight then increase dose to 1 1/2 tablets daily except 1 tablet on Mondays, Wednesdays and Fridays  Recheck 3 weeks

## 2017-04-25 ENCOUNTER — Encounter: Payer: Self-pay | Admitting: Cardiology

## 2017-04-25 ENCOUNTER — Ambulatory Visit (INDEPENDENT_AMBULATORY_CARE_PROVIDER_SITE_OTHER): Payer: Medicare HMO | Admitting: Cardiology

## 2017-04-25 ENCOUNTER — Other Ambulatory Visit: Payer: Self-pay

## 2017-04-25 VITALS — BP 110/80 | HR 83 | Ht 66.0 in | Wt 237.4 lb

## 2017-04-25 DIAGNOSIS — I6523 Occlusion and stenosis of bilateral carotid arteries: Secondary | ICD-10-CM | POA: Diagnosis not present

## 2017-04-25 DIAGNOSIS — E782 Mixed hyperlipidemia: Secondary | ICD-10-CM

## 2017-04-25 DIAGNOSIS — I10 Essential (primary) hypertension: Secondary | ICD-10-CM

## 2017-04-25 DIAGNOSIS — I4891 Unspecified atrial fibrillation: Secondary | ICD-10-CM

## 2017-04-25 NOTE — Patient Instructions (Addendum)
Your physician wants you to follow-up in: 1 year  With [facility] will receive a reminder letter in the mail two months in advance. If you don't receive a letter, please call our office to schedule the follow-up appointment.    Your physician recommends that you continue on your current medications as directed. Please refer to the Current Medication list given to you today.    If you need a refill on your cardiac medications before your next appointment, please call your pharmacy.    No lab work or tests ordered today.      Thank you for choosing [facility] !

## 2017-04-25 NOTE — Progress Notes (Signed)
Clinical Summary Mr. Curci is a 77 y.o.male seen today for follow up of the following medical problems.   1. Afib - No recent palpitations. No bleeding on coumadin - compliant with meds.   2. HTN - compliant with meds   3. Carotid stenosis - left CEA in 05/2006 - carotid US Jan 2017 mild bilateral disease -no recent neuro symptoms  - last test at [facility] last year per his report by pcp   5. Hyperlipidemia - intolerant to statins previously - he is unsure if he has tried zetia in the past.   - tolerating pravstatin 10mg  daily currently  - he reports upcoming labs with pcp  6. AAA screen - normal CT scan 2015     Past Medical History:  Diagnosis Date  . Atrial flutter ([place]) 06/2010  . Cerebrovascular disease 2008   Left CEA  . Chest pain    negative stress nuclear in 11/2002  . Chronic anticoagulation   . Dysrhythmia   . GERD (gastroesophageal reflux disease)    h/o esophagitis  . History of colonic polyps    Colonoscopy and polypectomy in 03/2011  . History of kidney stones   . Hyperlipidemia    Metabolic syndrome with increased triglycerides  . Hypertension   . Macular degeneration    legally blind  . Nephrolithiasis   . Peripheral vascular disease ([place])   . Tobacco abuse    50 pack years; 1/2 pack per day     No Active Allergies   Current Outpatient Medications  Medication Sig Dispense Refill  . amoxicillin-clavulanate (AUGMENTIN) 875-125 MG tablet Take 1 tablet by mouth 2 (two) times daily. One po bid x 7 days 14 tablet 0  . diltiazem (CARDIZEM) 60 MG tablet Take 1 tablet (60 mg total) by mouth 2 (two) times daily. 180 tablet 3  . HYDROcodone-acetaminophen (NORCO) 5-325 MG tablet Take 2 tablets by mouth every 6 (six) hours as needed for severe pain. 6 tablet 0  . levothyroxine (SYNTHROID, LEVOTHROID) 50 MCG tablet Take 50 mcg by mouth every morning.    . meclizine (ANTIVERT) 25 MG tablet Take 25 mg by mouth as needed for  dizziness or nausea.     . metoprolol tartrate (LOPRESSOR) 50 MG tablet Take 1 tablet (50 mg total) by mouth 2 (two) times daily. 180 tablet 3  . omega-3 acid ethyl esters (LOVAZA) 1 g capsule Take 1 capsule by mouth 2 (two) times daily.    . pantoprazole (PROTONIX) 40 MG tablet TAKE ONE TABLET BY MOUTH ONCE DAILY 90 tablet 3  . Polyethyl Glycol-Propyl Glycol (SYSTANE OP) Apply 1 drop to eye daily as needed (allergies).    . pravastatin (PRAVACHOL) 10 MG tablet Take 10 mg by mouth at bedtime.     Marland Kitchen Specialty Vitamins Products (ICAPS LUTEIN-ZEAXANTHIN PO) Take 1 tablet by mouth 2 (two) times daily.     Marland Kitchen warfarin (COUMADIN) 2.5 MG tablet Take 1 tablet daily except 1 1/2 tablets on Tuesdays, Thursdays and Saturdays (Patient taking differently: Take 2.5-3.75 mg by mouth See admin instructions. Take 1 tablet daily except 1 1/2 tablets on Tuesdays, Thursdays and Saturdays) 114 tablet 4   No current facility-administered medications for this visit.      Past Surgical History:  Procedure Laterality Date  . CAROTID ENDARTERECTOMY  2008   Left  . COLONOSCOPY  04/04/2011   Rogene Houston, MD;  multiple polyps ablated  . COLONOSCOPY N/A 06/12/2016   Procedure: COLONOSCOPY;  Surgeon:  Rogene Houston, MD;  Location: [facility];  Service: Endoscopy;  Laterality: N/A;  1030  . CYSTOSCOPY W/ URETERAL STENT PLACEMENT     and stone extraction     No Active Allergies    Family History  Problem Relation Age of Onset  . Heart attack Father   . Hypertension Sister   . Heart attack Brother   . Colon cancer Paternal Uncle   . Heart attack Brother      Social History Mr. Suit reports that he quit smoking about 7 years ago. His smoking use included cigarettes. He started smoking about 38 years ago. He has a 25.00 pack-year smoking history. He quit smokeless tobacco use about 6 years ago. Mr. Conwell reports that he does not drink alcohol.   Review of Systems CONSTITUTIONAL: No weight loss,  fever, chills, weakness or fatigue.  HEENT: Eyes: No visual loss, blurred vision, double vision or yellow sclerae.No hearing loss, sneezing, congestion, runny nose or sore throat.  SKIN: No rash or itching.  CARDIOVASCULAR: per hpi RESPIRATORY: No shortness of breath, cough or sputum.  GASTROINTESTINAL: No anorexia, nausea, vomiting or diarrhea. No abdominal pain or blood.  GENITOURINARY: No burning on urination, no polyuria NEUROLOGICAL: No headache, dizziness, syncope, paralysis, ataxia, numbness or tingling in the extremities. No change in bowel or bladder control.  MUSCULOSKELETAL: No muscle, back pain, joint pain or stiffness.  LYMPHATICS: No enlarged nodes. No history of splenectomy.  PSYCHIATRIC: No history of depression or anxiety.  ENDOCRINOLOGIC: No reports of sweating, cold or heat intolerance. No polyuria or polydipsia.  Marland Kitchen   Physical Examination Vitals:   04/25/17 0940  BP: 110/80  Pulse: 83  SpO2: 95%   Vitals:   04/25/17 0940  Weight: 237 lb 6.4 oz (107.7 kg)  Height: $Remove'5\' 6"'xYlvWHt$  (1.676 m)    Gen: resting comfortably, no acute distress HEENT: no scleral icterus, pupils equal round and reactive, no palptable cervical adenopathy,  CV: RRR,  No m/r/v, no jvd Resp: Clear to auscultation bilaterally GI: abdomen is soft, non-tender, non-distended, normal bowel sounds, no hepatosplenomegaly MSK: extremities are warm, no edema.  Skin: warm, no rash Neuro:  no focal deficits Psych: appropriate affect   Diagnostic Studies 08/2010 Echo Study Conclusions  - Left ventricle: The cavity size was normal. Mild LVH with  disproportionate thickening of the septum. Systolic function was  mildly to moderately reduced. The estimated ejection fraction was  45%. Mild global hypokinesis; moderate hypokinesis of the mid and  distal septum. - Left atrium: The atrium was mildly to moderately dilated. - Right atrium: The atrium was mildly to moderately dilated. - Atrial septum: No  defect or patent foramen ovale was identified. - Pulmonary arteries: PA peak pressure: 80mm Hg (S). Transthoracic echocardiography. M-mode, complete 2D, spectral   04/2013 Carotid US IMPRESSION:  1. No evidence of residual or recurrent stenosis post left carotid  endarterectomy.  2. Mild right carotid bifurcation plaque resulting in less than 50%  diameter stenosis. The exam does not exclude plaque ulceration or  embolization. Continued surveillance recommended.  Jan 2017 Carotid US IMPRESSION: 1. Mild right common carotid and carotid bifurcation atherosclerotic vascular plaque. No flow limiting stenosis. Degree of stenosis less than 50%. 2. Mild left common carotid artery plaque. Left carotid bifurcation widely patent. Prior left carotid endarterectomy. No flow limiting stenosis. Degree of stenosis less than 50%. 3. Vertebral arteries are patent antegrade flow .    Assessment and Plan  1. Afib - continue coumadin for stroke prevention,  he has not been interested in NOACs. CHADS2Vasc score is 3, continue anticoag.  - no recent symptoms, continue current meds  2. HTN - bp at goal, continue current meds  3. Carotid stenosis -no recent symptoms, continue current medical therapy.   4. Hyperlipidemia - tolerating low dose statins only - f/u labs from pcp      Arnoldo Lenis, M.D.

## 2017-04-29 ENCOUNTER — Encounter: Payer: Self-pay | Admitting: Cardiology

## 2017-05-07 ENCOUNTER — Ambulatory Visit (INDEPENDENT_AMBULATORY_CARE_PROVIDER_SITE_OTHER): Payer: Medicare HMO | Admitting: *Deleted

## 2017-05-07 DIAGNOSIS — Z5181 Encounter for therapeutic drug level monitoring: Secondary | ICD-10-CM | POA: Diagnosis not present

## 2017-05-07 DIAGNOSIS — I4891 Unspecified atrial fibrillation: Secondary | ICD-10-CM | POA: Diagnosis not present

## 2017-05-07 DIAGNOSIS — I679 Cerebrovascular disease, unspecified: Secondary | ICD-10-CM | POA: Diagnosis not present

## 2017-05-07 LAB — POCT INR: INR: 1.8

## 2017-05-07 NOTE — Patient Instructions (Signed)
Increase dose to 1 1/2 tablets daily except 1 tablet on Mondays  Recheck 3 weeks

## 2017-05-12 ENCOUNTER — Emergency Department (HOSPITAL_COMMUNITY)
Admission: EM | Admit: 2017-05-12 | Discharge: 2017-05-12 | Disposition: A | Discharge status: Home / Self Care | Payer: Medicare HMO | Attending: Emergency Medicine | Admitting: Emergency Medicine

## 2017-05-12 ENCOUNTER — Encounter (HOSPITAL_COMMUNITY): Payer: Self-pay

## 2017-05-12 ENCOUNTER — Emergency Department (HOSPITAL_COMMUNITY): Payer: Medicare HMO

## 2017-05-12 DIAGNOSIS — M10072 Idiopathic gout, left ankle and foot: Secondary | ICD-10-CM | POA: Diagnosis not present

## 2017-05-12 DIAGNOSIS — Z79899 Other long term (current) drug therapy: Secondary | ICD-10-CM | POA: Diagnosis not present

## 2017-05-12 DIAGNOSIS — M79672 Pain in left foot: Secondary | ICD-10-CM | POA: Diagnosis not present

## 2017-05-12 DIAGNOSIS — Z87891 Personal history of nicotine dependence: Secondary | ICD-10-CM | POA: Diagnosis not present

## 2017-05-12 DIAGNOSIS — M1000 Idiopathic gout, unspecified site: Secondary | ICD-10-CM

## 2017-05-12 DIAGNOSIS — M79675 Pain in left toe(s): Secondary | ICD-10-CM | POA: Diagnosis present

## 2017-05-12 DIAGNOSIS — R002 Palpitations: Secondary | ICD-10-CM | POA: Diagnosis not present

## 2017-05-12 DIAGNOSIS — I10 Essential (primary) hypertension: Secondary | ICD-10-CM | POA: Diagnosis not present

## 2017-05-12 DIAGNOSIS — Z7901 Long term (current) use of anticoagulants: Secondary | ICD-10-CM | POA: Insufficient documentation

## 2017-05-12 DIAGNOSIS — M7989 Other specified soft tissue disorders: Secondary | ICD-10-CM | POA: Diagnosis not present

## 2017-05-12 MED ORDER — DEXAMETHASONE 4 MG PO TABS: 4.0000 mg | ORAL_TABLET | Freq: Two times a day (BID) | ORAL | 0 refills | Status: DC

## 2017-05-12 MED ORDER — HYDROCODONE-ACETAMINOPHEN 5-325 MG PO TABS: 1.0000 | ORAL_TABLET | ORAL | 0 refills | Status: DC | PRN

## 2017-05-12 MED ORDER — HYDROCODONE-ACETAMINOPHEN 5-325 MG PO TABS
2.0000 | ORAL_TABLET | Freq: Once | ORAL | Status: AC
  Administered 2017-05-12: 2 via ORAL
  Filled 2017-05-12: qty 2

## 2017-05-12 MED ORDER — PREDNISONE 20 MG PO TABS
40.0000 mg | ORAL_TABLET | Freq: Once | ORAL | Status: AC
Start: 2017-05-12 — End: 2017-05-12
  Administered 2017-05-12: 40 mg via ORAL
  Filled 2017-05-12: qty 2

## 2017-05-12 MED ORDER — ONDANSETRON HCL 4 MG PO TABS
4.0000 mg | ORAL_TABLET | Freq: Once | ORAL | Status: AC
  Administered 2017-05-12: 4 mg via ORAL
  Filled 2017-05-12: qty 1

## 2017-05-12 NOTE — Discharge Instructions (Signed)
Vital signs have been reviewed.  Your x-ray shows arthritis changes of the joints of your left foot.  I believe that she may have gout present.  Please use Decadron 2 times daily with food.  Use Tylenol extra strength for mild pain, use Norco for more severe pain.  Norco may cause drowsiness, please use this medication with caution.  Please see Dr. Hilma Favors for additional evaluation and management of your discomfort in the office.

## 2017-05-12 NOTE — ED Triage Notes (Signed)
Left foot pain and swelling.  Hurts at the great toe.  No known injury.  The whole top of my foot up to my ankle hurts when I put pressure on it.  Hurts to walk.  Have soaked in epson salts and used horse lineament on it and it still hurts.  Have not taken anything for pain.  Foot has never been x-rayed and no history of gout.  No injury, hurting x 1 week,

## 2017-05-12 NOTE — ED Provider Notes (Signed)
[facility] EMERGENCY DEPARTMENT Provider Note   CSN: [id number] Arrival date & time: 05/12/17  1205     History   Chief Complaint Chief Complaint  Patient presents with  . Foot Pain    HPI Daniel Morse is a 77 y.o. male.  Patient is a 77 year old male who presents to the emergency department with a complaint of left foot pain and swelling.  This problem started approximately 1 week ago.  The patient complains of pain primarily at the great toe.  He has no known injury.  He states that he has not been on his feet more than usual.  The pain has moved from the toe, to the foot, to the ankle.  It is worse when he puts pressure on it and walking.  No fever or chills reported.  Nothing is broken the skin that he is aware of.  He is not had any previous operations or procedures involving this extremity.  Patient has tried warm Epson salt soaks.  He is also tried horse liniment to the area, but these were unsuccessful in stopping the pain.  He presents now for assistance with this issue.   The history is provided by the patient.    Past Medical History:  Diagnosis Date  . Atrial flutter ([place]) 06/2010  . Cerebrovascular disease 2008   Left CEA  . Chest pain    negative stress nuclear in 11/2002  . Chronic anticoagulation   . Dysrhythmia   . GERD (gastroesophageal reflux disease)    h/o esophagitis  . History of colonic polyps    Colonoscopy and polypectomy in 03/2011  . History of kidney stones   . Hyperlipidemia    Metabolic syndrome with increased triglycerides  . Hypertension   . Macular degeneration    legally blind  . Nephrolithiasis   . Peripheral vascular disease ([place])   . Tobacco abuse    50 pack years; 1/2 pack per day    Patient Active Problem List   Diagnosis Date Noted  . Perirectal abscess   . Family hx of colon cancer 04/19/2016  . History of colonic polyps 04/19/2016  . Encounter for therapeutic drug monitoring 04/14/2013  . Hypokalemia 12/03/2012    . Peripheral vascular disease ([place]) 11/05/2012  . Chronic anticoagulation   . Tobacco abuse   . Cerebrovascular disease   . Nephrolithiasis   . Hyperlipidemia   . Hypertension   . GERD (gastroesophageal reflux disease)   . Atrial fibrillation     Past Surgical History:  Procedure Laterality Date  . CAROTID ENDARTERECTOMY  2008   Left  . COLONOSCOPY  04/04/2011   Rogene Houston, MD;  multiple polyps ablated  . COLONOSCOPY N/A 06/12/2016   Procedure: COLONOSCOPY;  Surgeon: Rogene Houston, MD;  Location: [facility];  Service: Endoscopy;  Laterality: N/A;  1030  . CYSTOSCOPY W/ URETERAL STENT PLACEMENT     and stone extraction       Home Medications    Prior to Admission medications   Medication Sig Start Date End Date Taking? Authorizing Provider  diltiazem (CARDIZEM) 60 MG tablet Take 1 tablet (60 mg total) by mouth 2 (two) times daily. 10/21/16   Arnoldo Lenis, MD  HYDROcodone-acetaminophen (NORCO) 5-325 MG tablet Take 2 tablets by mouth every 6 (six) hours as needed for severe pain. 03/24/17   Orlie Dakin, MD  levothyroxine (SYNTHROID, LEVOTHROID) 50 MCG tablet Take 50 mcg by mouth every morning. 03/19/17   [provider]  meclizine (ANTIVERT) 25 MG tablet Take 25 mg by mouth as needed for dizziness or nausea.  12/25/16   [provider]  metoprolol tartrate (LOPRESSOR) 50 MG tablet Take 1 tablet (50 mg total) by mouth 2 (two) times daily. 04/16/17   Laqueta Linden, MD  omega-3 acid ethyl esters (LOVAZA) 1 g capsule Take 1 capsule by mouth 2 (two) times daily. 03/19/17   [provider]  pantoprazole (PROTONIX) 40 MG tablet TAKE ONE TABLET BY MOUTH ONCE DAILY 01/20/17   [name], Joline Maxcy, MD  Polyethyl Glycol-Propyl Glycol (SYSTANE OP) Apply 1 drop to eye daily as needed (allergies).    [provider]  pravastatin (PRAVACHOL) 10 MG tablet Take 10 mg by mouth at bedtime.     [provider]  Specialty Vitamins Products  (ICAPS LUTEIN-ZEAXANTHIN PO) Take 1 tablet by mouth 2 (two) times daily.     [provider]  warfarin (COUMADIN) 2.5 MG tablet Take 1 tablet daily except 1 1/2 tablets on Tuesdays, Thursdays and Saturdays Patient taking differently: Take 2.5-3.75 mg by mouth See admin instructions. Take 1 tablet daily except 1 1/2 tablets on Tuesdays, Thursdays and Saturdays 02/04/17   Antoine Poche, MD    Family History Family History  Problem Relation Age of Onset  . Heart attack Father   . Hypertension Sister   . Heart attack Brother   . Colon cancer Paternal Uncle   . Heart attack Brother     Social History Social History   Tobacco Use  . Smoking status: Former Smoker    Packs/day: 0.50    Years: 50.00    Pack years: 25.00    Types: Cigarettes    Start date: 11/06/1978    Last attempt to quit: 11/05/2009    Years since quitting: 7.5  . Smokeless tobacco: Former Neurosurgeon    Quit date: 06/01/2010  Substance Use Topics  . Alcohol use: No    Alcohol/week: 0.0 oz    Comment: occasionally  . Drug use: No     Allergies   Patient has no known allergies.   Review of Systems Review of Systems  Constitutional: Negative for activity change.       All ROS Neg except as noted in HPI  HENT: Negative for nosebleeds.   Eyes: Negative for photophobia and discharge.  Respiratory: Negative for cough, shortness of breath and wheezing.   Cardiovascular: Positive for palpitations. Negative for chest pain.  Gastrointestinal: Negative for abdominal pain and blood in stool.  Genitourinary: Negative for dysuria, frequency and hematuria.  Musculoskeletal: Positive for arthralgias. Negative for back pain and neck pain.  Skin: Negative.   Neurological: Negative for dizziness, seizures and speech difficulty.  Psychiatric/Behavioral: Negative for confusion and hallucinations.     Physical Exam Updated Vital Signs BP 140/89 (BP Location: Left Arm)   Pulse 85   Temp 98.3 F (36.8 C) (Oral)    Resp 16   Ht 5\' 6"  (1.676 m)   Wt 104.3 kg (230 lb)   SpO2 95%   BMI 37.12 kg/m   Physical Exam  Constitutional: He is oriented to person, place, and time. He appears well-developed and well-nourished.  Non-toxic appearance.  HENT:  Head: Normocephalic.  Right Ear: Tympanic membrane and external ear normal.  Left Ear: Tympanic membrane and external ear normal.  Eyes: EOM and lids are normal. Pupils are equal, round, and reactive to light.  Neck: Normal range of motion. Neck supple. Carotid bruit is not present.  Cardiovascular:  Normal rate, regular rhythm, normal heart sounds, intact distal pulses and normal pulses.  Pulmonary/Chest: Breath sounds normal. No respiratory distress.  Abdominal: Soft. Bowel sounds are normal. There is no tenderness. There is no guarding.  Musculoskeletal: Normal range of motion.  There is pain and some redness involving the metatarsophalangeal joint of the left foot great toe.  There is tenderness to palpation in this area.  The dorsalis pedis pulse is 2+.  Capillary refill is less than 2 seconds.  No lesions noted between the toes of the left foot.  No puncture wound noted of the plantar surface.  Lymphadenopathy:       Head (right side): No submandibular adenopathy present.       Head (left side): No submandibular adenopathy present.    He has no cervical adenopathy.  Neurological: He is alert and oriented to person, place, and time. He has normal strength. No cranial nerve deficit or sensory deficit.  Skin: Skin is warm and dry.  Psychiatric: He has a normal mood and affect. His speech is normal.  Nursing note and vitals reviewed.    ED Treatments / Results  Labs (all labs ordered are listed, but only abnormal results are displayed) Labs Reviewed - No data to display  EKG  EKG Interpretation None       Radiology Dg Foot Complete Left  Result Date: 05/12/2017 CLINICAL DATA:  Acute left foot pain and swelling without known injury. EXAM:  LEFT FOOT - COMPLETE 3+ VIEW COMPARISON:  None. FINDINGS: There is no evidence of fracture or dislocation. Mild degenerative changes seen involving the first metatarsophalangeal joint. Mild posterior calcaneal spurring is noted. Dorsal soft tissue swelling is noted. 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 Dorsal soft tissue swelling is noted. No fracture or dislocation is noted. Electronically Signed   By: Marijo Conception, M.D.   On: 05/12/2017 13:46    Procedures Procedures (including critical care time)  Medications Ordered in ED Medications - No data to display   Initial Impression / Assessment and Plan / ED Course  I have reviewed the triage vital signs and the nursing notes.  Pertinent labs & imaging results that were available during my care of the patient were reviewed by me and considered in my medical decision making (see chart for details).       Final Clinical Impressions(s) / ED Diagnoses MDM Vital signs are within normal limits. There is been no puncture wounds or any other injury that broke the skin of the lower extremity.  No recent injury or trauma reported.  X-ray is negative for fracture or dislocation.  There is mild degenerative joint disease changes noted.  I suspect the patient is having a gout arthritis attack. Patient will be treated with Decadron and Norco for pain.  Have asked him to follow-up with Dr. Hilma Favors for additional evaluation and management.  Patient is in agreement with this plan.   Final diagnoses:  Acute idiopathic gout, unspecified site    ED Discharge Orders        Ordered    dexamethasone (DECADRON) 4 MG tablet  2 times daily with meals     05/12/17 1523    HYDROcodone-acetaminophen (NORCO/VICODIN) 5-325 MG tablet  Every 4 hours PRN     05/12/17 1523       Lily Kocher, PA-C 05/14/17 1450    Julianne Rice, MD 05/14/17 1534

## 2017-05-20 DIAGNOSIS — H3530 Unspecified macular degeneration: Secondary | ICD-10-CM | POA: Diagnosis not present

## 2017-05-20 DIAGNOSIS — Z1389 Encounter for screening for other disorder: Secondary | ICD-10-CM | POA: Diagnosis not present

## 2017-05-20 DIAGNOSIS — I6523 Occlusion and stenosis of bilateral carotid arteries: Secondary | ICD-10-CM | POA: Diagnosis not present

## 2017-05-20 DIAGNOSIS — I4891 Unspecified atrial fibrillation: Secondary | ICD-10-CM | POA: Diagnosis not present

## 2017-05-20 DIAGNOSIS — Z6835 Body mass index (BMI) 35.0-35.9, adult: Secondary | ICD-10-CM | POA: Diagnosis not present

## 2017-05-20 DIAGNOSIS — E782 Mixed hyperlipidemia: Secondary | ICD-10-CM | POA: Diagnosis not present

## 2017-06-02 ENCOUNTER — Ambulatory Visit (INDEPENDENT_AMBULATORY_CARE_PROVIDER_SITE_OTHER): Payer: Medicare HMO | Admitting: *Deleted

## 2017-06-02 DIAGNOSIS — I4891 Unspecified atrial fibrillation: Secondary | ICD-10-CM | POA: Diagnosis not present

## 2017-06-02 DIAGNOSIS — Z5181 Encounter for therapeutic drug level monitoring: Secondary | ICD-10-CM | POA: Diagnosis not present

## 2017-06-02 DIAGNOSIS — I679 Cerebrovascular disease, unspecified: Secondary | ICD-10-CM

## 2017-06-02 LAB — POCT INR: INR: 3.5

## 2017-06-02 NOTE — Patient Instructions (Signed)
Hold coumadin tonight then resume 1 1/2 tablets daily except 1 tablet on Mondays  Recheck 3 weeks

## 2017-06-11 DIAGNOSIS — E6609 Other obesity due to excess calories: Secondary | ICD-10-CM | POA: Diagnosis not present

## 2017-06-11 DIAGNOSIS — D72829 Elevated white blood cell count, unspecified: Secondary | ICD-10-CM | POA: Diagnosis not present

## 2017-06-11 DIAGNOSIS — Z6836 Body mass index (BMI) 36.0-36.9, adult: Secondary | ICD-10-CM | POA: Diagnosis not present

## 2017-06-11 DIAGNOSIS — M109 Gout, unspecified: Secondary | ICD-10-CM | POA: Diagnosis not present

## 2017-06-23 ENCOUNTER — Ambulatory Visit (INDEPENDENT_AMBULATORY_CARE_PROVIDER_SITE_OTHER): Payer: Medicare HMO | Admitting: *Deleted

## 2017-06-23 DIAGNOSIS — Z5181 Encounter for therapeutic drug level monitoring: Secondary | ICD-10-CM | POA: Diagnosis not present

## 2017-06-23 DIAGNOSIS — I4891 Unspecified atrial fibrillation: Secondary | ICD-10-CM | POA: Diagnosis not present

## 2017-06-23 DIAGNOSIS — I679 Cerebrovascular disease, unspecified: Secondary | ICD-10-CM | POA: Diagnosis not present

## 2017-06-23 LAB — POCT INR: INR: 2.8

## 2017-06-23 NOTE — Patient Instructions (Signed)
Continue coumadin 1 1/2 tablets daily except 1 tablet on Mondays  Recheck in 4 weeks

## 2017-07-28 ENCOUNTER — Other Ambulatory Visit: Payer: Self-pay | Admitting: Cardiology

## 2017-07-29 ENCOUNTER — Other Ambulatory Visit: Payer: Self-pay | Admitting: Cardiology

## 2017-07-29 MED ORDER — METOPROLOL TARTRATE 50 MG PO TABS: 50.0000 mg | ORAL_TABLET | Freq: Two times a day (BID) | ORAL | 3 refills | Status: DC

## 2017-07-29 NOTE — Telephone Encounter (Signed)
°*  STAT* If patient is at the pharmacy, call can be transferred to refill team.   1. Which medications need to be refilled? (please list name of each medication and dose if known)  metoprolol tartrate (LOPRESSOR) 50 MG tablet [[id number]]    2. Which pharmacy/location (including street and city if local pharmacy) is medication to be sent to? [facility]   3. Do they need a 30 day or 90 day supply?  90 day

## 2017-07-29 NOTE — Telephone Encounter (Signed)
Refilled metoprolol 50 mg BID per phone request

## 2017-08-27 ENCOUNTER — Ambulatory Visit (INDEPENDENT_AMBULATORY_CARE_PROVIDER_SITE_OTHER): Payer: Medicare HMO | Admitting: *Deleted

## 2017-08-27 DIAGNOSIS — I679 Cerebrovascular disease, unspecified: Secondary | ICD-10-CM | POA: Diagnosis not present

## 2017-08-27 DIAGNOSIS — Z5181 Encounter for therapeutic drug level monitoring: Secondary | ICD-10-CM

## 2017-08-27 DIAGNOSIS — I4891 Unspecified atrial fibrillation: Secondary | ICD-10-CM | POA: Diagnosis not present

## 2017-08-27 LAB — POCT INR: INR: 2.5 (ref 2.0–3.0)

## 2017-08-27 NOTE — Patient Instructions (Signed)
Pt states he cut dose back to 1 tablet daily except 1 1/2 tablets on Tuesdays, Thursdays and Saturdays Continue above dose  Recheck in 4 weeks

## 2017-10-15 ENCOUNTER — Ambulatory Visit (INDEPENDENT_AMBULATORY_CARE_PROVIDER_SITE_OTHER): Payer: Medicare HMO | Admitting: *Deleted

## 2017-10-15 DIAGNOSIS — I679 Cerebrovascular disease, unspecified: Secondary | ICD-10-CM

## 2017-10-15 DIAGNOSIS — Z5181 Encounter for therapeutic drug level monitoring: Secondary | ICD-10-CM

## 2017-10-15 DIAGNOSIS — I4891 Unspecified atrial fibrillation: Secondary | ICD-10-CM

## 2017-10-15 LAB — POCT INR: INR: 2.1 (ref 2.0–3.0)

## 2017-10-15 NOTE — Patient Instructions (Signed)
Continue coumadin 1 tablet daily except 1 1/2 tablets on Tuesdays, Thursdays and Saturdays Recheck in 6 weeks 

## 2017-11-06 ENCOUNTER — Telehealth: Payer: Self-pay | Admitting: Cardiology

## 2017-11-06 MED ORDER — DILTIAZEM HCL 60 MG PO TABS: 60.0000 mg | ORAL_TABLET | Freq: Two times a day (BID) | ORAL | 3 refills | Status: DC

## 2017-11-06 NOTE — Telephone Encounter (Signed)
Patient's wife states that he needs 3 mth supply on  Diltiazem sent to [facility] / [facility]

## 2017-11-26 ENCOUNTER — Ambulatory Visit (INDEPENDENT_AMBULATORY_CARE_PROVIDER_SITE_OTHER): Payer: Medicare HMO | Admitting: *Deleted

## 2017-11-26 DIAGNOSIS — I4891 Unspecified atrial fibrillation: Secondary | ICD-10-CM

## 2017-11-26 DIAGNOSIS — Z5181 Encounter for therapeutic drug level monitoring: Secondary | ICD-10-CM

## 2017-11-26 DIAGNOSIS — I679 Cerebrovascular disease, unspecified: Secondary | ICD-10-CM | POA: Diagnosis not present

## 2017-11-26 LAB — POCT INR: INR: 2.4 (ref 2.0–3.0)

## 2017-11-26 NOTE — Patient Instructions (Signed)
Continue coumadin 1 tablet daily except 1 1/2 tablets on Tuesdays, Thursdays and Saturdays Recheck in 6 weeks 

## 2017-12-30 DIAGNOSIS — Z6835 Body mass index (BMI) 35.0-35.9, adult: Secondary | ICD-10-CM | POA: Diagnosis not present

## 2017-12-30 DIAGNOSIS — R946 Abnormal results of thyroid function studies: Secondary | ICD-10-CM | POA: Diagnosis not present

## 2017-12-30 DIAGNOSIS — Z0001 Encounter for general adult medical examination with abnormal findings: Secondary | ICD-10-CM | POA: Diagnosis not present

## 2017-12-30 DIAGNOSIS — Z23 Encounter for immunization: Secondary | ICD-10-CM | POA: Diagnosis not present

## 2017-12-30 DIAGNOSIS — I4891 Unspecified atrial fibrillation: Secondary | ICD-10-CM | POA: Diagnosis not present

## 2017-12-30 DIAGNOSIS — R972 Elevated prostate specific antigen [PSA]: Secondary | ICD-10-CM | POA: Diagnosis not present

## 2017-12-30 DIAGNOSIS — Z1389 Encounter for screening for other disorder: Secondary | ICD-10-CM | POA: Diagnosis not present

## 2017-12-30 DIAGNOSIS — I6523 Occlusion and stenosis of bilateral carotid arteries: Secondary | ICD-10-CM | POA: Diagnosis not present

## 2017-12-30 DIAGNOSIS — E782 Mixed hyperlipidemia: Secondary | ICD-10-CM | POA: Diagnosis not present

## 2017-12-30 DIAGNOSIS — R948 Abnormal results of function studies of other organs and systems: Secondary | ICD-10-CM | POA: Diagnosis not present

## 2017-12-30 DIAGNOSIS — E6609 Other obesity due to excess calories: Secondary | ICD-10-CM | POA: Diagnosis not present

## 2017-12-30 DIAGNOSIS — E039 Hypothyroidism, unspecified: Secondary | ICD-10-CM | POA: Diagnosis not present

## 2017-12-30 DIAGNOSIS — E785 Hyperlipidemia, unspecified: Secondary | ICD-10-CM | POA: Diagnosis not present

## 2018-01-12 ENCOUNTER — Other Ambulatory Visit: Payer: Self-pay | Admitting: Cardiology

## 2018-01-12 ENCOUNTER — Other Ambulatory Visit (INDEPENDENT_AMBULATORY_CARE_PROVIDER_SITE_OTHER): Payer: Self-pay | Admitting: Internal Medicine

## 2018-01-12 MED ORDER — METOPROLOL TARTRATE 50 MG PO TABS: 50.0000 mg | ORAL_TABLET | Freq: Two times a day (BID) | ORAL | 3 refills | Status: DC

## 2018-01-12 NOTE — Telephone Encounter (Signed)
escribed lopressor refill to [facility] without any issue

## 2018-01-12 NOTE — Telephone Encounter (Signed)
Pt is having a hard time getting his refill for metoprolol tartrate (LOPRESSOR) 50 MG tablet [[id number]]  From [facility]-- wife states she's call them for refill but hasn't heard anything

## 2018-01-19 ENCOUNTER — Ambulatory Visit (INDEPENDENT_AMBULATORY_CARE_PROVIDER_SITE_OTHER): Payer: Medicare HMO | Admitting: *Deleted

## 2018-01-19 DIAGNOSIS — I679 Cerebrovascular disease, unspecified: Secondary | ICD-10-CM | POA: Diagnosis not present

## 2018-01-19 DIAGNOSIS — Z5181 Encounter for therapeutic drug level monitoring: Secondary | ICD-10-CM | POA: Diagnosis not present

## 2018-01-19 LAB — POCT INR: INR: 1.9 — AB (ref 2.0–3.0)

## 2018-01-19 NOTE — Patient Instructions (Signed)
Take coumadin extra 1 tablet today then resume 1 tablet daily except 1 1/2 tablets on Tuesdays, Thursdays and Saturdays  Recheck in 4 weeks

## 2018-02-02 ENCOUNTER — Encounter: Payer: Self-pay | Admitting: Internal Medicine

## 2018-02-02 DIAGNOSIS — R945 Abnormal results of liver function studies: Secondary | ICD-10-CM | POA: Diagnosis not present

## 2018-02-16 ENCOUNTER — Ambulatory Visit (INDEPENDENT_AMBULATORY_CARE_PROVIDER_SITE_OTHER): Payer: Medicare HMO | Admitting: *Deleted

## 2018-02-16 DIAGNOSIS — I4891 Unspecified atrial fibrillation: Secondary | ICD-10-CM

## 2018-02-16 DIAGNOSIS — Z5181 Encounter for therapeutic drug level monitoring: Secondary | ICD-10-CM | POA: Diagnosis not present

## 2018-02-16 DIAGNOSIS — I679 Cerebrovascular disease, unspecified: Secondary | ICD-10-CM

## 2018-02-16 LAB — POCT INR: INR: 2.2 (ref 2.0–3.0)

## 2018-02-16 NOTE — Patient Instructions (Signed)
Continue coumadin 1 tablet daily except 1 1/2 tablets on Tuesdays, Thursdays and Saturdays Recheck in 4 weeks 

## 2018-03-10 DIAGNOSIS — R945 Abnormal results of liver function studies: Secondary | ICD-10-CM | POA: Diagnosis not present

## 2018-03-10 DIAGNOSIS — E6609 Other obesity due to excess calories: Secondary | ICD-10-CM | POA: Diagnosis not present

## 2018-03-10 DIAGNOSIS — Z6834 Body mass index (BMI) 34.0-34.9, adult: Secondary | ICD-10-CM | POA: Diagnosis not present

## 2018-04-01 ENCOUNTER — Ambulatory Visit (INDEPENDENT_AMBULATORY_CARE_PROVIDER_SITE_OTHER): Payer: Medicare HMO | Admitting: Internal Medicine

## 2018-04-01 ENCOUNTER — Encounter (INDEPENDENT_AMBULATORY_CARE_PROVIDER_SITE_OTHER): Payer: Self-pay | Admitting: *Deleted

## 2018-04-01 ENCOUNTER — Encounter (INDEPENDENT_AMBULATORY_CARE_PROVIDER_SITE_OTHER): Payer: Self-pay | Admitting: Internal Medicine

## 2018-04-01 VITALS — BP 103/77 | HR 101 | Temp 99.0°F | Ht 66.0 in | Wt 211.1 lb

## 2018-04-01 DIAGNOSIS — I4891 Unspecified atrial fibrillation: Secondary | ICD-10-CM | POA: Diagnosis not present

## 2018-04-01 DIAGNOSIS — I739 Peripheral vascular disease, unspecified: Secondary | ICD-10-CM

## 2018-04-01 DIAGNOSIS — R748 Abnormal levels of other serum enzymes: Secondary | ICD-10-CM

## 2018-04-01 LAB — HEPATIC FUNCTION PANEL
AG RATIO: 0.6 (calc) — AB (ref 1.0–2.5)
ALKALINE PHOSPHATASE (APISO): 122 U/L — AB (ref 40–115)
ALT: 103 U/L — AB (ref 9–46)
AST: 106 U/L — ABNORMAL HIGH (ref 10–35)
Albumin: 2.9 g/dL — ABNORMAL LOW (ref 3.6–5.1)
Bilirubin, Direct: 0.5 mg/dL — ABNORMAL HIGH (ref 0.0–0.2)
GLOBULIN: 5 g/dL — AB (ref 1.9–3.7)
Indirect Bilirubin: 0.8 mg/dL (calc) (ref 0.2–1.2)
Total Bilirubin: 1.3 mg/dL — ABNORMAL HIGH (ref 0.2–1.2)
Total Protein: 7.9 g/dL (ref 6.1–8.1)

## 2018-04-01 NOTE — Patient Instructions (Addendum)
Hepatic today.  OV in 3 months.

## 2018-04-01 NOTE — Progress Notes (Addendum)
Subjective:    Patient ID: Daniel Morse, male    DOB: 03/23/1940, 78 y.o.   MRN: [id number]  HPI Here today with c/o  elevated liver enzymes. Appetite is not good.  He says he sleeps a lot.  He feels exhausted. He feels sore all over. He says his joints are not working right.  He has a BM daily. No melena or BRRB.  Only new medication the his levothyroxine.  No NSAIDs. Stopped drinking etoh since 2019. States was not a heavy drinker.   03/10/2018 total bili 2.3, Direct b ili 0.5, ALP 180, AST 309.  Acute H epatitis Panel negative.    Atrial fib and maintained on Warfarin.   CMP Latest Ref Rng & Units 03/24/2017 02/23/2014 11/03/2013  Glucose 65 - 99 mg/dL 94 136(H) 139(H)  BUN 6 - 20 mg/dL $Remove'19 20 19  'ojvUsQK$ Creatinine 0.61 - 1.24 mg/dL 1.01 1.04 0.94  Sodium 135 - 145 mmol/L 143 133(L) 142  Potassium 3.5 - 5.1 mmol/L 3.8 3.3(L) 3.6(L)  Chloride 101 - 111 mmol/L 105 98 101  CO2 22 - 32 mmol/L $RemoveB'26 26 25  'sOiOiUlu$ Calcium 8.9 - 10.3 mg/dL 9.0 9.0 9.5  Total Protein 6.0 - 8.3 g/dL - 7.6 7.4  Total Bilirubin 0.3 - 1.2 mg/dL - 2.4(H) 1.1  Alkaline Phos 39 - 117 U/L - 80 86  AST 0 - 37 U/L - 22 23  ALT 0 - 53 U/L - 21 24      Cmet in March of 2017 was normal.  Review of Systems Past Medical History:  Diagnosis Date  . Atrial flutter ([place]) 06/2010  . Cerebrovascular disease 2008   Left CEA  . Chest pain    negative stress nuclear in 11/2002  . Chronic anticoagulation   . Dysrhythmia   . GERD (gastroesophageal reflux disease)    h/o esophagitis  . History of colonic polyps    Colonoscopy and polypectomy in 03/2011  . History of kidney stones   . Hyperlipidemia    Metabolic syndrome with increased triglycerides  . Hypertension   . Macular degeneration    legally blind  . Nephrolithiasis   . Peripheral vascular disease ([place])   . Tobacco abuse    50 pack years; 1/2 pack per day    Past Surgical History:  Procedure Laterality Date  . CAROTID ENDARTERECTOMY  2008   Left  .  COLONOSCOPY  04/04/2011   Rogene Houston, MD;  multiple polyps ablated  . COLONOSCOPY N/A 06/12/2016   Procedure: COLONOSCOPY;  Surgeon: Rogene Houston, MD;  Location[facility];  Service: Endoscopy;  Laterality: N/A;  1030  . CYSTOSCOPY W/ URETERAL STENT PLACEMENT     and stone extraction    No Known Allergies  Current Outpatient Medications on File Prior to Visit  Medication Sig Dispense Refill  . diltiazem (CARDIZEM) 60 MG tablet Take 1 tablet (60 mg total) by mouth 2 (two) times daily. 180 tablet 3  . levothyroxine (SYNTHROID, LEVOTHROID) 50 MCG tablet Take 50 mcg by mouth every morning.    . metoprolol tartrate (LOPRESSOR) 50 MG tablet Take 1 tablet (50 mg total) by mouth 2 (two) times daily. 180 tablet 3  . pantoprazole (PROTONIX) 40 MG tablet TAKE 1 TABLET BY MOUTH ONCE DAILY 90 tablet 3  . Specialty Vitamins Products (ICAPS LUTEIN-ZEAXANTHIN PO) Take 1 tablet by mouth 2 (two) times daily.     Marland Kitchen warfarin (COUMADIN) 2.5 MG tablet Take 1 tablet daily except 1 1/2  tablets on Tuesdays, Thursdays and Saturdays (Patient taking differently: Take 2.5-3.75 mg by mouth See admin instructions. Take 1 tablet daily except 1 1/2 tablets on Tuesdays, Thursdays and Saturdays) 114 tablet 4   No current facility-administered medications on file prior to visit.         Objective:   Physical Exam Blood pressure 103/77, pulse (!) 101, temperature 99 F (37.2 C), height $RemoveBe'5\' 6"'ADZtrCdLp$  (1.676 m), weight 211 lb 1.6 oz (95.8 kg). Alert and oriented. Skin warm and dry. Oral mucosa is moist.   . Sclera anicteric, conjunctivae is pink. Thyroid not enlarged. No cervical lymphadenopathy. Lungs clear. Heart regular rate and rhythm.  Abdomen is soft. Bowel sounds are positive. No hepatomegaly. No abdominal masses felt. No tenderness.  No edema to lower extremities.           Assessment & Plan:  Elevated liver enzymes. Will get an Hepatic function.  US abdomen.  Further recommendations to follow.

## 2018-04-03 ENCOUNTER — Ambulatory Visit (HOSPITAL_COMMUNITY)
Admission: RE | Admit: 2018-04-03 | Discharge: 2018-04-03 | Disposition: A | Discharge status: Home / Self Care | Payer: Medicare HMO | Source: Ambulatory Visit | Attending: Internal Medicine | Admitting: Internal Medicine

## 2018-04-03 DIAGNOSIS — N281 Cyst of kidney, acquired: Secondary | ICD-10-CM | POA: Diagnosis not present

## 2018-04-03 DIAGNOSIS — R748 Abnormal levels of other serum enzymes: Secondary | ICD-10-CM | POA: Diagnosis not present

## 2018-04-05 ENCOUNTER — Telehealth: Payer: Self-pay | Admitting: Gastroenterology

## 2018-04-05 MED ORDER — ONDANSETRON HCL 4 MG PO TABS: ORAL_TABLET | ORAL | 0 refills | Status: DC

## 2018-04-05 NOTE — Telephone Encounter (Addendum)
WIFE CALLED  AND T HAVING FATIGUE AND NOT EATING. ALSO COUGHING REAL BAD. HAVING RED EYES AND EVERYTHING LOOKED RED FOR A COUPLE MINS. DISCUSSED LIVER ENZYMES AND U/S RESULTS. RECOMMENDED PT GO TO ED. FOR EVALUATION.   RX SENT FOR ZOFRAN. DECLINED RX FOR TESSALON PERLES.

## 2018-04-06 ENCOUNTER — Ambulatory Visit (HOSPITAL_COMMUNITY)
Admission: RE | Admit: 2018-04-06 | Discharge: 2018-04-06 | Disposition: A | Discharge status: Home / Self Care | Payer: Medicare HMO | Source: Ambulatory Visit | Attending: Internal Medicine | Admitting: Internal Medicine

## 2018-04-06 ENCOUNTER — Ambulatory Visit (INDEPENDENT_AMBULATORY_CARE_PROVIDER_SITE_OTHER): Payer: Medicare HMO | Admitting: Internal Medicine

## 2018-04-06 ENCOUNTER — Encounter (INDEPENDENT_AMBULATORY_CARE_PROVIDER_SITE_OTHER): Payer: Self-pay | Admitting: Internal Medicine

## 2018-04-06 ENCOUNTER — Other Ambulatory Visit (INDEPENDENT_AMBULATORY_CARE_PROVIDER_SITE_OTHER): Payer: Self-pay | Admitting: *Deleted

## 2018-04-06 VITALS — BP 110/75 | HR 84 | Temp 97.8°F | Resp 18 | Ht 66.0 in | Wt 207.8 lb

## 2018-04-06 DIAGNOSIS — R748 Abnormal levels of other serum enzymes: Secondary | ICD-10-CM

## 2018-04-06 DIAGNOSIS — N433 Hydrocele, unspecified: Secondary | ICD-10-CM | POA: Diagnosis not present

## 2018-04-06 DIAGNOSIS — N5089 Other specified disorders of the male genital organs: Secondary | ICD-10-CM | POA: Insufficient documentation

## 2018-04-06 DIAGNOSIS — R531 Weakness: Secondary | ICD-10-CM | POA: Diagnosis not present

## 2018-04-06 DIAGNOSIS — R05 Cough: Secondary | ICD-10-CM

## 2018-04-06 DIAGNOSIS — R059 Cough, unspecified: Secondary | ICD-10-CM

## 2018-04-06 NOTE — Patient Instructions (Addendum)
Physician will call with results of blood work chest x-ray and ultrasound when completed. Notify if you have temp greater than 101 F

## 2018-04-06 NOTE — Progress Notes (Signed)
Presenting complaint;  Follow-up for elevated transaminases.  Database sent subjective:  Patient is 78 year old Caucasian male who has history of GERD and colonic polyps who was last seen on 04/01/2018 because of elevated transaminases. Patient states that several months ago he was noted to have mildly elevated transaminases but these returned to normal without any intervention. He was noted to have AST of 92 and ALT of 132 on 02/02/2018.  LFTs were repeated on 03/10/2018 and his AST was 309 and ALT was 380.  Bilirubin was 2.3 with direct of 0.57 and alkaline phosphatase was 180.  Dr. Armandina Gemma proceeded to check for viral hepatitis.  Hep A IgM antibody was negative hepatitis B surface antigen was negative hep B core antibody IgM was also negative and finally HCV antibody was nonreactive. Following his visit last week he had LFTs repeated.  AST was 106 and ALT 103. Ultrasound on 04/03/2018 revealed hepatic steatosis but no evidence of gallstones or dilated bile duct.  He also had 21 mm cyst in left kidney.  Patient returns for follow-up visit today.  His wife called Dr. Oneida Alar yesterday because of nausea and prescription for ondansetron was called.  Patient does not feel any better.  He has poor appetite and nausea but no vomiting.  He has lost 18 pounds in the last 1 month since his symptoms began.  He also has had night sweats but no fever.  He has had dry hacking cough.  He was coughing intermittently while I was in the room.  He also reports painless enlargement to left testis. He states heartburn is well controlled with medication which he has been on for years.  He denies abdominal pain pruritus dysuria hematuria or diarrhea.  He has myalgias.  He has not had any skin rash.  He did take 3 days of amoxicillin for ear problems about 2 weeks ago.  He did not take any antibiotics prior to his illness.  He also has not traveled outside the country.  He does not have pets at home. Patient says he has not  driven for at least 20 years because he is legally blind.   Current Medications: Outpatient Encounter Medications as of 04/06/2018  Medication Sig  . diltiazem (CARDIZEM) 60 MG tablet Take 1 tablet (60 mg total) by mouth 2 (two) times daily.  Marland Kitchen levothyroxine (SYNTHROID, LEVOTHROID) 50 MCG tablet Take 50 mcg by mouth every morning.  . metoprolol tartrate (LOPRESSOR) 50 MG tablet Take 1 tablet (50 mg total) by mouth 2 (two) times daily.  . ondansetron (ZOFRAN) 4 MG tablet 1 po q6h prn for NAUSEA/VOMITING  . pantoprazole (PROTONIX) 40 MG tablet TAKE 1 TABLET BY MOUTH ONCE DAILY  . Specialty Vitamins Products (ICAPS LUTEIN-ZEAXANTHIN PO) Take 1 tablet by mouth 2 (two) times daily.   Marland Kitchen warfarin (COUMADIN) 2.5 MG tablet Take 1 tablet daily except 1 1/2 tablets on Tuesdays, Thursdays and Saturdays (Patient taking differently: Take 2.5-3.75 mg by mouth See admin instructions. Take 1 tablet daily except 1 1/2 tablets on Tuesdays, Thursdays and Saturdays)   No facility-administered encounter medications on file as of 04/06/2018.      Objective: Blood pressure 110/75, pulse 84, temperature 97.8 F (36.6 C), temperature source Oral, resp. rate 18, height _0  (1.676 m), weight 207 lb 12.8 oz (94.3 kg). Patient is alert and in no acute distress. Conjunctiva is injected. Sclera is nonicteric Oropharyngeal mucosa is normal. No neck masses or thyromegaly noted. Cardiac exam with regular rhythm normal S1 and S2. No  murmur or gallop noted. Lungs are clear to auscultation. Abdomen is full but soft and nontender with organomegaly or masses. No LE edema or clubbing noted.  Labs/studies Results:  CBC Latest Ref Rng & Units 03/24/2017 02/23/2014 11/03/2013  WBC 4.0 - 10.5 K/uL 11.9(H) 16.6(H) 7.9  Hemoglobin 13.0 - 17.0 g/dL 15.2 16.9 16.8  Hematocrit 39.0 - 52.0 % 45.5 47.6 46.8  Platelets 150 - 400 K/uL 191 146(L) 181    CMP Latest Ref Rng & Units 04/01/2018 03/24/2017 02/23/2014  Glucose 65 - 99 mg/dL -  94 136(H)  BUN 6 - 20 mg/dL - 19 20  Creatinine 0.61 - 1.24 mg/dL - 1.01 1.04  Sodium 135 - 145 mmol/L - 143 133(L)  Potassium 3.5 - 5.1 mmol/L - 3.8 3.3(L)  Chloride 101 - 111 mmol/L - 105 98  CO2 22 - 32 mmol/L - 26 26  Calcium 8.9 - 10.3 mg/dL - 9.0 9.0  Total Protein 6.1 - 8.1 g/dL 7.9 - 7.6  Total Bilirubin 0.2 - 1.2 mg/dL 1.3(H) - 2.4(H)  Alkaline Phos 39 - 117 U/L - - 80  AST 10 - 35 U/L 106(H) - 22  ALT 9 - 46 U/L 103(H) - 21    Hepatic Function Latest Ref Rng & Units 04/01/2018 02/23/2014 11/03/2013  Total Protein 6.1 - 8.1 g/dL 7.9 7.6 7.4  Albumin 3.5 - 5.2 g/dL - 4.0 3.8  AST 10 - 35 U/L 106(H) 22 23  ALT 9 - 46 U/L 103(H) 21 24  Alk Phosphatase 39 - 117 U/L - 80 86  Total Bilirubin 0.2 - 1.2 mg/dL 1.3(H) 2.4(H) 1.1  Bilirubin, Direct 0.0 - 0.2 mg/dL 0.5(H) - <0.2    Ultrasound results as above.  Fatty liver but no evidence of gallstones dilated bile duct or focal abnormalities.  Assessment:  #1.  Hepatocellular injury.  His symptoms suggest viral infection but test for hepatitis A,B and C were negative.  He does not have any risk factors.  Since markers negative for hepatitis A, B and C need to look for other etiologies such as autoimmune or drug-induced hepatitis is unlikely since he has not been on a new medication.  He could have atypical or uncommon infection.  #2.  Hacking cough.  Cough may be part and parcel of his acute illness.  Will obtain chest film.  #3.  Left testicular enlargement.  This is possibly an unrelated issue.  Plan:  Patient encouraged to increase p.o. intake of foods that he can tolerate. Continue use ondansetron up to 3 times a day as needed. She will go to radiology department for chest film and scrotal ultrasound. We will also go to the lab for CBC with differential sed rate ANA, TSH and repeat LFTs. Patient advised to call office should he experience fever. Patient will be contacted with results of the studies and further recommendations  made. Office visit in 1 months.

## 2018-04-08 ENCOUNTER — Other Ambulatory Visit (INDEPENDENT_AMBULATORY_CARE_PROVIDER_SITE_OTHER): Payer: Self-pay | Admitting: Internal Medicine

## 2018-04-08 DIAGNOSIS — B179 Acute viral hepatitis, unspecified: Secondary | ICD-10-CM

## 2018-04-08 LAB — HEPATIC FUNCTION PANEL
AG Ratio: 0.5 (calc) — ABNORMAL LOW (ref 1.0–2.5)
ALT: 65 U/L — ABNORMAL HIGH (ref 9–46)
AST: 89 U/L — AB (ref 10–35)
Albumin: 2.6 g/dL — ABNORMAL LOW (ref 3.6–5.1)
Alkaline phosphatase (APISO): 98 U/L (ref 35–144)
BILIRUBIN TOTAL: 1.4 mg/dL — AB (ref 0.2–1.2)
Bilirubin, Direct: 0.6 mg/dL — ABNORMAL HIGH (ref 0.0–0.2)
Globulin: 5.3 g/dL (calc) — ABNORMAL HIGH (ref 1.9–3.7)
Indirect Bilirubin: 0.8 mg/dL (calc) (ref 0.2–1.2)
Total Protein: 7.9 g/dL (ref 6.1–8.1)

## 2018-04-08 LAB — CBC WITH DIFFERENTIAL/PLATELET
Absolute Monocytes: 672 cells/uL (ref 200–950)
Basophils Absolute: 64 cells/uL (ref 0–200)
Basophils Relative: 0.4 %
EOS ABS: 16 {cells}/uL (ref 15–500)
Eosinophils Relative: 0.1 %
HCT: 40.2 % (ref 38.5–50.0)
Hemoglobin: 14 g/dL (ref 13.2–17.1)
Lymphs Abs: 2176 cells/uL (ref 850–3900)
MCH: 32 pg (ref 27.0–33.0)
MCHC: 34.8 g/dL (ref 32.0–36.0)
MCV: 92 fL (ref 80.0–100.0)
MPV: 12 fL (ref 7.5–12.5)
Monocytes Relative: 4.2 %
Neutro Abs: 13072 cells/uL — ABNORMAL HIGH (ref 1500–7800)
Neutrophils Relative %: 81.7 %
Platelets: 436 10*3/uL — ABNORMAL HIGH (ref 140–400)
RBC: 4.37 10*6/uL (ref 4.20–5.80)
RDW: 12.1 % (ref 11.0–15.0)
Total Lymphocyte: 13.6 %
WBC: 16 10*3/uL — ABNORMAL HIGH (ref 3.8–10.8)

## 2018-04-08 LAB — ANA: Anti Nuclear Antibody(ANA): NEGATIVE

## 2018-04-08 LAB — SEDIMENTATION RATE: Sed Rate: 91 mm/h — ABNORMAL HIGH (ref 0–20)

## 2018-04-08 LAB — TSH: TSH: 1.47 m[IU]/L (ref 0.40–4.50)

## 2018-04-09 NOTE — Telephone Encounter (Signed)
Noted. Dr. Laural Golden has seen.

## 2018-04-13 ENCOUNTER — Other Ambulatory Visit: Payer: Self-pay | Admitting: Physician Assistant

## 2018-04-13 ENCOUNTER — Telehealth (INDEPENDENT_AMBULATORY_CARE_PROVIDER_SITE_OTHER): Payer: Self-pay | Admitting: *Deleted

## 2018-04-13 NOTE — Telephone Encounter (Signed)
Daughter called and states that her dad is getting worse. He is not eating and he will be going for his BX tomorrow at [facility]. His nausea is really bad and the Zofran is not working. She is asking if Dr.[name] would call in the Phenergan to see if this is would help him. Per daughter ,Gwenlyn Fudge , he really is getting worse. She would also appreciate call from [place] about this BX. Ect.  Per Dr.[name] - may call in Phenergan 12.5 mg - Take 1 by mouth twice a day #10 no refills. This was called to the patient's pharmacy , [facility] in [place] .

## 2018-04-14 ENCOUNTER — Ambulatory Visit (HOSPITAL_COMMUNITY)
Admission: RE | Admit: 2018-04-14 | Discharge: 2018-04-14 | Disposition: A | Discharge status: Home / Self Care | Payer: Medicare HMO | Source: Ambulatory Visit | Attending: Internal Medicine | Admitting: Internal Medicine

## 2018-04-14 ENCOUNTER — Inpatient Hospital Stay (HOSPITAL_COMMUNITY)
Admission: EM | Admit: 2018-04-14 | Discharge: 2018-04-26 | DRG: 683 | Disposition: A | Discharge status: Home / Self Care | Payer: Medicare HMO | Attending: Internal Medicine | Admitting: Internal Medicine

## 2018-04-14 ENCOUNTER — Other Ambulatory Visit: Payer: Self-pay

## 2018-04-14 ENCOUNTER — Emergency Department (HOSPITAL_COMMUNITY): Payer: Medicare HMO

## 2018-04-14 ENCOUNTER — Encounter (HOSPITAL_COMMUNITY): Payer: Self-pay

## 2018-04-14 DIAGNOSIS — E785 Hyperlipidemia, unspecified: Secondary | ICD-10-CM | POA: Diagnosis not present

## 2018-04-14 DIAGNOSIS — T45511A Poisoning by anticoagulants, accidental (unintentional), initial encounter: Secondary | ICD-10-CM | POA: Diagnosis present

## 2018-04-14 DIAGNOSIS — Z87442 Personal history of urinary calculi: Secondary | ICD-10-CM

## 2018-04-14 DIAGNOSIS — R945 Abnormal results of liver function studies: Secondary | ICD-10-CM | POA: Diagnosis not present

## 2018-04-14 DIAGNOSIS — E440 Moderate protein-calorie malnutrition: Secondary | ICD-10-CM

## 2018-04-14 DIAGNOSIS — E782 Mixed hyperlipidemia: Secondary | ICD-10-CM | POA: Diagnosis not present

## 2018-04-14 DIAGNOSIS — Z6832 Body mass index (BMI) 32.0-32.9, adult: Secondary | ICD-10-CM | POA: Diagnosis not present

## 2018-04-14 DIAGNOSIS — I776 Arteritis, unspecified: Secondary | ICD-10-CM | POA: Diagnosis present

## 2018-04-14 DIAGNOSIS — N179 Acute kidney failure, unspecified: Principal | ICD-10-CM | POA: Diagnosis present

## 2018-04-14 DIAGNOSIS — H5789 Other specified disorders of eye and adnexa: Secondary | ICD-10-CM | POA: Diagnosis present

## 2018-04-14 DIAGNOSIS — B179 Acute viral hepatitis, unspecified: Secondary | ICD-10-CM

## 2018-04-14 DIAGNOSIS — M109 Gout, unspecified: Secondary | ICD-10-CM | POA: Diagnosis present

## 2018-04-14 DIAGNOSIS — I482 Chronic atrial fibrillation, unspecified: Secondary | ICD-10-CM | POA: Diagnosis not present

## 2018-04-14 DIAGNOSIS — K5730 Diverticulosis of large intestine without perforation or abscess without bleeding: Secondary | ICD-10-CM | POA: Diagnosis not present

## 2018-04-14 DIAGNOSIS — E872 Acidosis: Secondary | ICD-10-CM | POA: Diagnosis present

## 2018-04-14 DIAGNOSIS — Z87891 Personal history of nicotine dependence: Secondary | ICD-10-CM

## 2018-04-14 DIAGNOSIS — N141 Nephropathy induced by other drugs, medicaments and biological substances: Secondary | ICD-10-CM | POA: Diagnosis present

## 2018-04-14 DIAGNOSIS — H548 Legal blindness, as defined in USA: Secondary | ICD-10-CM | POA: Diagnosis present

## 2018-04-14 DIAGNOSIS — A0472 Enterocolitis due to Clostridium difficile, not specified as recurrent: Secondary | ICD-10-CM | POA: Diagnosis not present

## 2018-04-14 DIAGNOSIS — N170 Acute kidney failure with tubular necrosis: Secondary | ICD-10-CM | POA: Diagnosis not present

## 2018-04-14 DIAGNOSIS — F1011 Alcohol abuse, in remission: Secondary | ICD-10-CM | POA: Diagnosis present

## 2018-04-14 DIAGNOSIS — R791 Abnormal coagulation profile: Secondary | ICD-10-CM

## 2018-04-14 DIAGNOSIS — Z86010 Personal history of colonic polyps: Secondary | ICD-10-CM

## 2018-04-14 DIAGNOSIS — H3530 Unspecified macular degeneration: Secondary | ICD-10-CM | POA: Diagnosis present

## 2018-04-14 DIAGNOSIS — E8881 Metabolic syndrome: Secondary | ICD-10-CM | POA: Diagnosis present

## 2018-04-14 DIAGNOSIS — E039 Hypothyroidism, unspecified: Secondary | ICD-10-CM | POA: Diagnosis present

## 2018-04-14 DIAGNOSIS — Z515 Encounter for palliative care: Secondary | ICD-10-CM | POA: Diagnosis not present

## 2018-04-14 DIAGNOSIS — I7782 Antineutrophilic cytoplasmic antibody (ANCA) vasculitis: Secondary | ICD-10-CM

## 2018-04-14 DIAGNOSIS — I480 Paroxysmal atrial fibrillation: Secondary | ICD-10-CM | POA: Diagnosis present

## 2018-04-14 DIAGNOSIS — N289 Disorder of kidney and ureter, unspecified: Secondary | ICD-10-CM | POA: Diagnosis not present

## 2018-04-14 DIAGNOSIS — K219 Gastro-esophageal reflux disease without esophagitis: Secondary | ICD-10-CM | POA: Diagnosis present

## 2018-04-14 DIAGNOSIS — I10 Essential (primary) hypertension: Secondary | ICD-10-CM | POA: Diagnosis not present

## 2018-04-14 DIAGNOSIS — N451 Epididymitis: Secondary | ICD-10-CM | POA: Diagnosis present

## 2018-04-14 DIAGNOSIS — I4891 Unspecified atrial fibrillation: Secondary | ICD-10-CM | POA: Diagnosis present

## 2018-04-14 DIAGNOSIS — I959 Hypotension, unspecified: Secondary | ICD-10-CM | POA: Diagnosis present

## 2018-04-14 DIAGNOSIS — Z79890 Hormone replacement therapy (postmenopausal): Secondary | ICD-10-CM

## 2018-04-14 DIAGNOSIS — D72829 Elevated white blood cell count, unspecified: Secondary | ICD-10-CM

## 2018-04-14 DIAGNOSIS — J929 Pleural plaque without asbestos: Secondary | ICD-10-CM | POA: Diagnosis not present

## 2018-04-14 DIAGNOSIS — R69 Illness, unspecified: Secondary | ICD-10-CM | POA: Diagnosis not present

## 2018-04-14 DIAGNOSIS — D890 Polyclonal hypergammaglobulinemia: Secondary | ICD-10-CM | POA: Diagnosis present

## 2018-04-14 DIAGNOSIS — T45515A Adverse effect of anticoagulants, initial encounter: Secondary | ICD-10-CM | POA: Diagnosis present

## 2018-04-14 DIAGNOSIS — Z66 Do not resuscitate: Secondary | ICD-10-CM | POA: Diagnosis not present

## 2018-04-14 DIAGNOSIS — K760 Fatty (change of) liver, not elsewhere classified: Secondary | ICD-10-CM | POA: Diagnosis present

## 2018-04-14 DIAGNOSIS — K759 Inflammatory liver disease, unspecified: Secondary | ICD-10-CM | POA: Diagnosis present

## 2018-04-14 DIAGNOSIS — R000 Tachycardia, unspecified: Secondary | ICD-10-CM | POA: Diagnosis present

## 2018-04-14 DIAGNOSIS — I739 Peripheral vascular disease, unspecified: Secondary | ICD-10-CM | POA: Diagnosis present

## 2018-04-14 DIAGNOSIS — E860 Dehydration: Secondary | ICD-10-CM | POA: Diagnosis present

## 2018-04-14 DIAGNOSIS — Z7189 Other specified counseling: Secondary | ICD-10-CM | POA: Diagnosis not present

## 2018-04-14 DIAGNOSIS — Z8249 Family history of ischemic heart disease and other diseases of the circulatory system: Secondary | ICD-10-CM

## 2018-04-14 DIAGNOSIS — Z7901 Long term (current) use of anticoagulants: Secondary | ICD-10-CM

## 2018-04-14 DIAGNOSIS — I361 Nonrheumatic tricuspid (valve) insufficiency: Secondary | ICD-10-CM | POA: Diagnosis not present

## 2018-04-14 DIAGNOSIS — E781 Pure hyperglyceridemia: Secondary | ICD-10-CM | POA: Diagnosis present

## 2018-04-14 DIAGNOSIS — Z79899 Other long term (current) drug therapy: Secondary | ICD-10-CM

## 2018-04-14 DIAGNOSIS — R4020 Unspecified coma: Secondary | ICD-10-CM | POA: Diagnosis not present

## 2018-04-14 DIAGNOSIS — Z888 Allergy status to other drugs, medicaments and biological substances status: Secondary | ICD-10-CM

## 2018-04-14 LAB — COMPREHENSIVE METABOLIC PANEL
ALT: 40 U/L (ref 0–44)
AST: 50 U/L — AB (ref 15–41)
Albumin: 1.8 g/dL — ABNORMAL LOW (ref 3.5–5.0)
Alkaline Phosphatase: 71 U/L (ref 38–126)
Anion gap: 18 — ABNORMAL HIGH (ref 5–15)
BUN: 109 mg/dL — ABNORMAL HIGH (ref 8–23)
CO2: 15 mmol/L — ABNORMAL LOW (ref 22–32)
Calcium: 7.5 mg/dL — ABNORMAL LOW (ref 8.9–10.3)
Chloride: 101 mmol/L (ref 98–111)
Creatinine, Ser: 8.46 mg/dL — ABNORMAL HIGH (ref 0.61–1.24)
GFR calc Af Amer: 6 mL/min — ABNORMAL LOW (ref >60)
GFR, EST NON AFRICAN AMERICAN: 6 mL/min — AB (ref >60)
Glucose, Bld: 76 mg/dL (ref 70–99)
Potassium: 4.6 mmol/L (ref 3.5–5.1)
Sodium: 134 mmol/L — ABNORMAL LOW (ref 135–145)
Total Bilirubin: 1.7 mg/dL — ABNORMAL HIGH (ref 0.3–1.2)
Total Protein: 8.1 g/dL (ref 6.5–8.1)

## 2018-04-14 LAB — APTT
aPTT: 78 seconds — ABNORMAL HIGH (ref 24–36)
aPTT: 79 seconds — ABNORMAL HIGH (ref 24–36)

## 2018-04-14 LAB — CBC
HCT: 44.2 % (ref 39.0–52.0)
Hemoglobin: 14.6 g/dL (ref 13.0–17.0)
MCH: 31.9 pg (ref 26.0–34.0)
MCHC: 33 g/dL (ref 30.0–36.0)
MCV: 96.5 fL (ref 80.0–100.0)
NRBC: 0 % (ref 0.0–0.2)
Platelets: 550 10*3/uL — ABNORMAL HIGH (ref 150–400)
RBC: 4.58 MIL/uL (ref 4.22–5.81)
RDW: 14.6 % (ref 11.5–15.5)
WBC: 17.5 10*3/uL — ABNORMAL HIGH (ref 4.0–10.5)

## 2018-04-14 LAB — CBC WITH DIFFERENTIAL/PLATELET
Abs Immature Granulocytes: 0.15 10*3/uL — ABNORMAL HIGH (ref 0.00–0.07)
Basophils Absolute: 0 10*3/uL (ref 0.0–0.1)
Basophils Relative: 0 %
EOS PCT: 0 %
Eosinophils Absolute: 0 10*3/uL (ref 0.0–0.5)
HCT: 39.1 % (ref 39.0–52.0)
Hemoglobin: 13.3 g/dL (ref 13.0–17.0)
Immature Granulocytes: 1 %
Lymphocytes Relative: 13 %
Lymphs Abs: 2 10*3/uL (ref 0.7–4.0)
MCH: 31 pg (ref 26.0–34.0)
MCHC: 34 g/dL (ref 30.0–36.0)
MCV: 91.1 fL (ref 80.0–100.0)
MONOS PCT: 3 %
Monocytes Absolute: 0.5 10*3/uL (ref 0.1–1.0)
Neutro Abs: 13 10*3/uL — ABNORMAL HIGH (ref 1.7–7.7)
Neutrophils Relative %: 83 %
Platelets: 440 10*3/uL — ABNORMAL HIGH (ref 150–400)
RBC: 4.29 MIL/uL (ref 4.22–5.81)
RDW: 13.6 % (ref 11.5–15.5)
WBC: 15.7 10*3/uL — ABNORMAL HIGH (ref 4.0–10.5)
nRBC: 0 % (ref 0.0–0.2)

## 2018-04-14 LAB — PROTIME-INR
INR: 9.01
INR: 9.16
PROTHROMBIN TIME: 72 s — AB (ref 11.4–15.2)
Prothrombin Time: 72.9 seconds — ABNORMAL HIGH (ref 11.4–15.2)

## 2018-04-14 LAB — AMMONIA: Ammonia: 12 umol/L (ref 9–35)

## 2018-04-14 LAB — SODIUM, URINE, RANDOM: Sodium, Ur: 45 mmol/L

## 2018-04-14 LAB — PSA: Prostatic Specific Antigen: 1.82 ng/mL (ref 0.00–4.00)

## 2018-04-14 LAB — TROPONIN I: Troponin I: <0.03 ng/mL (ref <0.03)

## 2018-04-14 MED ORDER — ONDANSETRON HCL 4 MG/2ML IJ SOLN: 4.0000 mg | Freq: Four times a day (QID) | INTRAMUSCULAR | Status: DC | PRN

## 2018-04-14 MED ORDER — METOPROLOL TARTRATE 50 MG PO TABS
50.0000 mg | ORAL_TABLET | Freq: Two times a day (BID) | ORAL | Status: DC
  Administered 2018-04-14 – 2018-04-20 (×11): 50 mg via ORAL
  Filled 2018-04-14 (×12): qty 1

## 2018-04-14 MED ORDER — SODIUM CHLORIDE 0.9 % IV SOLN
INTRAVENOUS | Status: DC
  Administered 2018-04-14: 20:00:00 via INTRAVENOUS

## 2018-04-14 MED ORDER — ONDANSETRON HCL 4 MG PO TABS: 4.0000 mg | ORAL_TABLET | Freq: Four times a day (QID) | ORAL | Status: DC | PRN

## 2018-04-14 MED ORDER — VITAMIN B-1 100 MG PO TABS: 100.0000 mg | ORAL_TABLET | Freq: Every day | ORAL | Status: DC

## 2018-04-14 MED ORDER — VITAMIN B-1 100 MG PO TABS
100.0000 mg | ORAL_TABLET | Freq: Every day | ORAL | Status: DC
  Administered 2018-04-14 – 2018-04-26 (×13): 100 mg via ORAL
  Filled 2018-04-14 (×13): qty 1

## 2018-04-14 MED ORDER — VITAMIN K1 10 MG/ML IJ SOLN
5.0000 mg | Freq: Once | INTRAVENOUS | Status: AC
  Administered 2018-04-14: 5 mg via INTRAVENOUS
  Filled 2018-04-14: qty 0.5

## 2018-04-14 MED ORDER — ADULT MULTIVITAMIN W/MINERALS CH
1.0000 | ORAL_TABLET | Freq: Every day | ORAL | Status: DC
  Administered 2018-04-14 – 2018-04-26 (×13): 1 via ORAL
  Filled 2018-04-14 (×13): qty 1

## 2018-04-14 MED ORDER — LORAZEPAM 2 MG/ML IJ SOLN: 0.0000 mg | Freq: Two times a day (BID) | INTRAMUSCULAR | Status: AC

## 2018-04-14 MED ORDER — SODIUM CHLORIDE 0.9 % IV SOLN: INTRAVENOUS | Status: DC

## 2018-04-14 MED ORDER — OXYCODONE HCL 5 MG PO TABS: 10.0000 mg | ORAL_TABLET | ORAL | Status: DC | PRN

## 2018-04-14 MED ORDER — LIDOCAINE HCL (PF) 1 % IJ SOLN
INTRAMUSCULAR | Status: AC
  Filled 2018-04-14: qty 30

## 2018-04-14 MED ORDER — SODIUM CHLORIDE 0.9% FLUSH
3.0000 mL | Freq: Two times a day (BID) | INTRAVENOUS | Status: DC
  Administered 2018-04-15 – 2018-04-25 (×10): 3 mL via INTRAVENOUS

## 2018-04-14 MED ORDER — LORAZEPAM 2 MG/ML IJ SOLN: 0.0000 mg | Freq: Four times a day (QID) | INTRAMUSCULAR | Status: AC

## 2018-04-14 MED ORDER — VITAMIN B-1 100 MG PO TABS
100.0000 mg | ORAL_TABLET | Freq: Once | ORAL | Status: AC
  Administered 2018-04-14: 100 mg via ORAL
  Filled 2018-04-14: qty 1

## 2018-04-14 MED ORDER — LEVOTHYROXINE SODIUM 50 MCG PO TABS
50.0000 ug | ORAL_TABLET | Freq: Every day | ORAL | Status: DC
  Administered 2018-04-15 – 2018-04-26 (×12): 50 ug via ORAL
  Filled 2018-04-14 (×12): qty 1

## 2018-04-14 MED ORDER — SODIUM CHLORIDE 0.9 % IV BOLUS
500.0000 mL | Freq: Once | INTRAVENOUS | Status: AC
  Administered 2018-04-14: 500 mL via INTRAVENOUS

## 2018-04-14 MED ORDER — LORAZEPAM 1 MG PO TABS: 1.0000 mg | ORAL_TABLET | Freq: Four times a day (QID) | ORAL | Status: AC | PRN

## 2018-04-14 MED ORDER — STERILE WATER FOR INJECTION IV SOLN
INTRAVENOUS | Status: DC
  Administered 2018-04-15: via INTRAVENOUS
  Filled 2018-04-14 (×3): qty 850

## 2018-04-14 MED ORDER — LORAZEPAM 2 MG/ML IJ SOLN: 1.0000 mg | Freq: Four times a day (QID) | INTRAMUSCULAR | Status: AC | PRN

## 2018-04-14 MED ORDER — GELATIN ABSORBABLE 12-7 MM EX MISC
CUTANEOUS | Status: AC
  Filled 2018-04-14: qty 1

## 2018-04-14 MED ORDER — THIAMINE HCL 100 MG/ML IJ SOLN
Freq: Once | INTRAVENOUS | Status: AC
  Administered 2018-04-14: 20:00:00 via INTRAVENOUS
  Filled 2018-04-14: qty 1000

## 2018-04-14 MED ORDER — OXYCODONE HCL 5 MG PO TABS: 5.0000 mg | ORAL_TABLET | ORAL | Status: DC | PRN

## 2018-04-14 MED ORDER — THIAMINE HCL 100 MG/ML IJ SOLN: 100.0000 mg | Freq: Every day | INTRAMUSCULAR | Status: DC

## 2018-04-14 MED ORDER — FOLIC ACID 1 MG PO TABS: 1.0000 mg | ORAL_TABLET | Freq: Every day | ORAL | Status: DC

## 2018-04-14 MED ORDER — PANTOPRAZOLE SODIUM 40 MG PO TBEC
40.0000 mg | DELAYED_RELEASE_TABLET | Freq: Every day | ORAL | Status: DC
  Administered 2018-04-14 – 2018-04-18 (×5): 40 mg via ORAL
  Filled 2018-04-14 (×5): qty 1

## 2018-04-14 MED ORDER — TRAZODONE HCL 50 MG PO TABS: 50.0000 mg | ORAL_TABLET | Freq: Every evening | ORAL | Status: DC | PRN

## 2018-04-14 MED ORDER — ADULT MULTIVITAMIN W/MINERALS CH: 1.0000 | ORAL_TABLET | Freq: Every day | ORAL | Status: DC

## 2018-04-14 MED ORDER — FOLIC ACID 1 MG PO TABS
1.0000 mg | ORAL_TABLET | Freq: Every day | ORAL | Status: DC
  Administered 2018-04-14 – 2018-04-26 (×13): 1 mg via ORAL
  Filled 2018-04-14 (×13): qty 1

## 2018-04-14 MED ORDER — DILTIAZEM HCL 60 MG PO TABS
60.0000 mg | ORAL_TABLET | Freq: Two times a day (BID) | ORAL | Status: DC
  Administered 2018-04-15: 60 mg via ORAL
  Filled 2018-04-14 (×2): qty 1

## 2018-04-14 NOTE — ED Triage Notes (Signed)
Pt sent here from IR for elevated labs. PT 78 INR 9 [id number] Platelets 550. Pt a.o, nad noted.

## 2018-04-14 NOTE — Telephone Encounter (Signed)
Talk with patient's daughter Daniel Morse and his wife yesterday evening. I was told that he does not drink alcohol.  Patient's wife and was afraid to tell me when I saw him in the office. 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  Alcoholic hepatitis may not explain all his symptoms. He is to have liver biopsy today.

## 2018-04-14 NOTE — ED Notes (Signed)
Admitting Dr paged at [phone]

## 2018-04-14 NOTE — Progress Notes (Signed)
Patient ID: Daniel Morse, male   DOB: 07-Feb-1941, 78 y.o.   MRN: [id number]   INR 9.01 today !!! Wbc 17.5  Will not perform Liver core biopsy today  Pt is aware  Dr Anselm Pancoast having pt to go to ED for evaluation asap Pt is agreeable

## 2018-04-14 NOTE — ED Notes (Signed)
Bladder scan shower 319 ml. Pt is trying to use urinal as this time.

## 2018-04-14 NOTE — Consult Note (Signed)
Referring Provider: No ref. provider found Primary Care Physician:  Sharilyn Sites, MD Primary Nephrologist:    Reason for Consultation: Acute kidney injury, hypotension, tachycardia, weakness.  HPI: This is a 78 year old male who has a history of atrial fibrillation on chronic Coumadin therapy, hypertension gastroesophageal reflux disease and a significant history of alcohol use.  He was scheduled for liver biopsy today to evaluate weight loss decreased oral intake that he states is been present for about 2 months.  On presentation for biopsy he was found to have an INR greater than 9 and was sent to the emergency room for further evaluation.  He was noted to have a calcified granuloma in the right hepatic lobe although his liver was unremarkable unenhanced study it was limited by noncontrast.  Appears that he had a renal ultrasound performed on 04/03/2018 with a mildly echogenic liver reflecting steatosis no biliary dilatation was noted.  He had a 2.1 cm renal cyst.  Scrotal enlargement was evaluated on 04/06/2018.  And he was found to have a left enlarged epididymis suggestive of epididymitis.  It was unclear whether he was given antibiotics for this condition.  Although none is seen on review of epic record.  It appears that according to records a previous creatinine on 03/24/2017 was 1.01.  His creatinine today in the emergency room is 8.46.  I do not have any other data points at this time.  The patient denies the use of nonsteroidal anti-inflammatory drugs.  Denies pain.  His CT scan does not reveal any evidence of hydronephrosis or obstruction.  Last contrast-enhanced study was 03/24/2017.  Therapy no intra-arterial manipulations.  Vital signs reveal pulse 132 and regular blood pressure 108/79 temperature 97.8 and O2 sats 95% on room air  Sodium 134 potassium 4.6 chloride 101 CO2 15 glucose 76 BUN 109 creatinine 8.46 calcium 7.5 albumin 1.8 alkaline phosphatase 71 AST 50 ALT 40 ammonia 12 bilirubin 1.7  WBC 15.7 hemoglobin 13.3 platelets 440  Urine output 250 cc   PSA 1.82 in the urine sodium of 45  CT scan of head showed chronic ischemic white matter changes.  Calcified pleural plaque at lung base consistent with prior asbestos exposure a 14 mm cystic focus of the left pole of the left kidney revealing no evidence of nephrolithiasis or obstructive uropathy he also had colonic diverticulosis without acute diverticulitis   Past Medical History:  Diagnosis Date  . Atrial flutter ([place]) 06/2010  . Cerebrovascular disease 2008   Left CEA  . Chest pain    negative stress nuclear in 11/2002  . Chronic anticoagulation   . Dysrhythmia   . GERD (gastroesophageal reflux disease)    h/o esophagitis  . History of colonic polyps    Colonoscopy and polypectomy in 03/2011  . History of kidney stones   . Hyperlipidemia    Metabolic syndrome with increased triglycerides  . Hypertension   . Macular degeneration    legally blind  . Nephrolithiasis   . Peripheral vascular disease ([place])   . Tobacco abuse    50 pack years; 1/2 pack per day    Past Surgical History:  Procedure Laterality Date  . CAROTID ENDARTERECTOMY  2008   Left  . COLONOSCOPY  04/04/2011   Rogene Houston, MD;  multiple polyps ablated  . COLONOSCOPY N/A 06/12/2016   Procedure: COLONOSCOPY;  Surgeon: Rogene Houston, MD;  Location: [facility];  Service: Endoscopy;  Laterality: N/A;  1030  . [place]  and stone extraction    Prior to Admission medications   Medication Sig Start Date End Date Taking? Authorizing Provider  diltiazem (CARDIZEM) 60 MG tablet Take 1 tablet (60 mg total) by mouth 2 (two) times daily. 11/06/17  Yes Branch, Alphonse Guild, MD  levothyroxine (SYNTHROID, LEVOTHROID) 50 MCG tablet Take 50 mcg by mouth every morning. 03/19/17  Yes [provider]  metoprolol tartrate (LOPRESSOR) 50 MG tablet Take 1 tablet (50 mg total) by mouth 2 (two) times daily. 01/12/18  Yes Branch,  Alphonse Guild, MD  pantoprazole (PROTONIX) 40 MG tablet TAKE 1 TABLET BY MOUTH ONCE DAILY Patient taking differently: Take 40 mg by mouth.  01/12/18  Yes [name], Mechele Dawley, MD  Specialty Vitamins Products (ICAPS LUTEIN-ZEAXANTHIN PO) Take 1 tablet by mouth 2 (two) times daily.    Yes [provider]  warfarin (COUMADIN) 2.5 MG tablet Take 1 tablet daily except 1 1/2 tablets on Tuesdays, Thursdays and Saturdays Patient taking differently: Take 2.5-3.75 mg by mouth See admin instructions. Take 1 tablet daily except 1 1/2 tablets on Tuesdays, Thursdays and Saturdays 02/04/17  Yes Branch, Alphonse Guild, MD    Current Facility-Administered Medications  Medication Dose Route Frequency Provider Last Rate Last Dose  . 0.9 %  sodium chloride infusion   Intravenous Continuous Lady Deutscher, MD 125 mL/hr at 04/14/18 1936    . diltiazem (CARDIZEM) tablet 60 mg  60 mg Oral BID Lady Deutscher, MD      . folic acid (FOLVITE) tablet 1 mg  1 mg Oral Daily Lady Deutscher, MD      . Derrill Memo ON 04/15/2018] levothyroxine (SYNTHROID, LEVOTHROID) tablet 50 mcg  50 mcg Oral Q0600 Lady Deutscher, MD      . LORazepam (ATIVAN) injection 0-4 mg  0-4 mg Intravenous Q6H Lady Deutscher, MD       Followed by  . [START ON 04/16/2018] LORazepam (ATIVAN) injection 0-4 mg  0-4 mg Intravenous Q12H Lady Deutscher, MD      . LORazepam (ATIVAN) tablet 1 mg  1 mg Oral Q6H PRN Lady Deutscher, MD       Or  . LORazepam (ATIVAN) injection 1 mg  1 mg Intravenous Q6H PRN Lady Deutscher, MD      . metoprolol tartrate (LOPRESSOR) tablet 50 mg  50 mg Oral BID Lady Deutscher, MD      . multivitamin with minerals tablet 1 tablet  1 tablet Oral Daily Lady Deutscher, MD      . ondansetron [facility]) tablet 4 mg  4 mg Oral Q6H PRN Lady Deutscher, MD       Or  . ondansetron [facility]) injection 4 mg  4 mg Intravenous Q6H PRN Lady Deutscher, MD      . oxyCODONE (Oxy IR/ROXICODONE) immediate release tablet  10 mg  10 mg Oral Q4H PRN Lady Deutscher, MD      . oxyCODONE (Oxy IR/ROXICODONE) immediate release tablet 5 mg  5 mg Oral Q4H PRN Lady Deutscher, MD      . pantoprazole (PROTONIX) EC tablet 40 mg  40 mg Oral Daily Lady Deutscher, MD      . sodium chloride flush (NS) 0.9 % injection 3 mL  3 mL Intravenous Q12H Lady Deutscher, MD      . thiamine (VITAMIN B-1) tablet 100 mg  100 mg Oral Daily Lady Deutscher, MD       Or  . thiamine (B-1)  injection 100 mg  100 mg Intravenous Daily Lady Deutscher, MD      . traZODone (DESYREL) tablet 50 mg  50 mg Oral QHS PRN Lady Deutscher, MD       Current Outpatient Medications  Medication Sig Dispense Refill  . diltiazem (CARDIZEM) 60 MG tablet Take 1 tablet (60 mg total) by mouth 2 (two) times daily. 180 tablet 3  . levothyroxine (SYNTHROID, LEVOTHROID) 50 MCG tablet Take 50 mcg by mouth every morning.    . metoprolol tartrate (LOPRESSOR) 50 MG tablet Take 1 tablet (50 mg total) by mouth 2 (two) times daily. 180 tablet 3  . pantoprazole (PROTONIX) 40 MG tablet TAKE 1 TABLET BY MOUTH ONCE DAILY (Patient taking differently: Take 40 mg by mouth. ) 90 tablet 3  . Specialty Vitamins Products (ICAPS LUTEIN-ZEAXANTHIN PO) Take 1 tablet by mouth 2 (two) times daily.     Marland Kitchen warfarin (COUMADIN) 2.5 MG tablet Take 1 tablet daily except 1 1/2 tablets on Tuesdays, Thursdays and Saturdays (Patient taking differently: Take 2.5-3.75 mg by mouth See admin instructions. Take 1 tablet daily except 1 1/2 tablets on Tuesdays, Thursdays and Saturdays) 114 tablet 4   Facility-Administered Medications Ordered in Other Encounters  Medication Dose Route Frequency Provider Last Rate Last Dose  . 0.9 %  sodium chloride infusion   Intravenous Continuous Ardis Rowan, PA-C      . gelatin adsorbable (GELFOAM/SURGIFOAM) 12-7 MM sponge 12-7 mm           . lidocaine (PF) (XYLOCAINE) 1 % injection             Allergies as of 04/14/2018 - Review Complete  04/14/2018  Allergen Reaction Noted  . Statins Swelling 04/14/2018    Family History  Problem Relation Age of Onset  . Heart attack Father   . Hypertension Sister   . Heart attack Brother   . Colon cancer Paternal Uncle   . Heart attack Brother     Social History   Socioeconomic History  . Marital status: Married    Spouse name: Not on file  . Number of children: Not on file  . Years of education: Not on file  . Highest education level: Not on file  Occupational History  . Not on file  Social Needs  . Financial resource strain: Not on file  . Food insecurity:    Worry: Not on file    Inability: Not on file  . Transportation needs:    Medical: Not on file    Non-medical: Not on file  Tobacco Use  . Smoking status: Former Smoker    Packs/day: 0.50    Years: 50.00    Pack years: 25.00    Types: Cigarettes    Start date: 11/06/1978    Last attempt to quit: 11/05/2009    Years since quitting: 8.4  . Smokeless tobacco: Former Systems developer    Quit date: 06/01/2010  Substance and Sexual Activity  . Alcohol use: Yes    Alcohol/week: 20.0 standard drinks    Types: 20 Shots of liquor per week    Comment: 1/2 gallon of vodka (as much as possible) quit 8 weeks ago  . Drug use: No  . Sexual activity: Not on file  Lifestyle  . Physical activity:    Days per week: Not on file    Minutes per session: Not on file  . Stress: Not on file  Relationships  . Social connections:    Talks on phone: Not  on file    Gets together: Not on file    Attends religious service: Not on file    Active member of club or organization: Not on file    Attends meetings of clubs or organizations: Not on file    Relationship status: Not on file  . Intimate partner violence:    Fear of current or ex partner: Not on file    Emotionally abused: Not on file    Physically abused: Not on file    Forced sexual activity: Not on file  Other Topics Concern  . Not on file  Social History Narrative  . Not on file     Review of Systems: Gen: Weakness and fatigue with no fever sweats or chills poor appetite and decreased oral intake for at least 1 to 2 months HEENT: No visual complaints, No history of Retinopathy. Normal external appearance No Epistaxis or Sore throat. No sinusitis.   CV: History of chronic anticoagulation for atrial fibrillation Resp: Denies dyspnea at rest, dyspnea with exercise, cough, sputum, wheezing, coughing up blood, and pleurisy. GI: Denies vomiting blood, jaundice, and fecal incontinence.   Denies dysphagia or odynophagia. GU : Denies urinary burning, blood in urine, urinary frequency, urinary hesitancy, nocturnal urination, and urinary incontinence.  History of renal calculi MS: Denies joint pain, limitation of movement, and swelling, stiffness, low back pain, extremity pain. Denies muscle weakness, cramps, atrophy.  No use of non steroidal antiinflammatory drugs. Derm: Denies rash, itching, dry skin, hives, moles, warts, or unhealing ulcers.  Psych: Denies depression, anxiety, memory loss, suicidal ideation, hallucinations, paranoia, and confusion. Heme: Denies bruising, bleeding, and enlarged lymph nodes. Neuro: No headache.  No diplopia. No dysarthria.  No dysphasia.  No history of CVA.  No Seizures. No paresthesias.  No weakness. Endocrine No DM.  No Thyroid disease.  No Adrenal disease.  Physical Exam: Vital signs in last 24 hours: Temp:  [97.8 F (36.6 C)] 97.8 F (36.6 C) (02/11 1226) Pulse Rate:  [91-143] 143 (02/11 1938) Resp:  [11-27] 23 (02/11 1930) BP: (95-116)/(62-88) 108/79 (02/11 1938) SpO2:  [95 %-100 %] 95 % (02/11 1930) Weight:  [90.7 kg] 90.7 kg (02/11 0916)   General:   Ill-appearing gentleman coherent and pleasant.  He had very dry mucous membranes Head:  Normocephalic and atraumatic. Eyes:  Sclera clear, no icterus.   Conjunctiva pink. Ears:  Normal auditory acuity. Nose:  No deformity, discharge,  or lesions. Mouth:  No deformity or lesions,  dentition normal.  Dry mucous membranes Neck:  Supple; no masses or thyromegaly flat JVP Lungs:  Clear throughout to auscultation.   No wheezes, crackles, or rhonchi. No acute distress. Heart: Irregular irregular with atrial fibrillation; no murmurs, clicks, rubs,  or gallops. Abdomen:  Soft, nontender and nondistended. No masses, hepatosplenomegaly or hernias noted. Normal bowel sounds, without guarding, and without rebound.   Msk:  Symmetrical without gross deformities. Normal posture. Pulses:  No carotid, renal, femoral bruits. DP and PT symmetrical and equal Extremities:  Without clubbing or edema. Neurologic:  Alert and  oriented x4;  grossly normal neurologically. Skin:  Intact without significant lesions or rashes. Cervical Nodes:  No significant cervical adenopathy. Psych:  Alert and cooperative. Normal mood and affect.  Intake/Output from previous day: No intake/output data recorded. Intake/Output this shift: Total I/O In: -  Out: 250 [Urine:250]  Lab Results: Recent Labs    04/14/18 0936 04/14/18 1323  WBC 17.5* 15.7*  HGB 14.6 13.3  HCT 44.2 39.1  PLT 550*  440*   BMET Recent Labs    04/14/18 1323  NA 134*  K 4.6  CL 101  CO2 15*  GLUCOSE 76  BUN 109*  CREATININE 8.46*  CALCIUM 7.5*   LFT Recent Labs    04/14/18 1323  PROT 8.1  ALBUMIN 1.8*  AST 50*  ALT 40  ALKPHOS 71  BILITOT 1.7*   PT/INR Recent Labs    04/14/18 1023 04/14/18 1323  LABPROT 72.0* 72.9*  INR 9.01* 9.16*   Hepatitis Panel No results for input(s): HEPBSAG, HCVAB, HEPAIGM, HEPBIGM in the last 72 hours.  Studies/Results: Ct Head Wo Contrast  Result Date: 04/14/2018 CLINICAL DATA:  Elevated INR with altered level of consciousness and confusion. EXAM: CT HEAD WITHOUT CONTRAST TECHNIQUE: Contiguous axial images were obtained from the base of the skull through the vertex without intravenous contrast. COMPARISON:  Head CT 05/10/2012 FINDINGS: Brain: Stable involutional changes of  brain with chronic microvascular ischemic disease of white matter. No acute large vascular territory infarction, hemorrhage or mass. No mass effect. No hydrocephalus. Midline fourth ventricle and basal cisterns. Brainstem and cerebellum are nonacute. Vascular: Atherosclerosis of the carotid siphons. Skull: Intact without suspicious osseous lesions or fracture. Sinuses/Orbits: Intact Other: None IMPRESSION: No acute intracranial abnormality. 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 Electronically Signed   By: Ashley Royalty M.D.   On: 04/14/2018 16:48   Ct Renal Stone Study  Result Date: 04/14/2018 CLINICAL DATA:  Acute renal failure EXAM: CT ABDOMEN AND PELVIS WITHOUT CONTRAST TECHNIQUE: Multidetector CT imaging of the abdomen and pelvis was performed following the standard protocol without IV contrast. COMPARISON:  02/23/2014 FINDINGS: Lower chest: Calcified pleural plaque is identified at each lung base consistent prior asbestos exposure. Coronary arteriosclerosis of the included heart is noted without pericardial effusion or thickening. Hepatobiliary: Calcified granuloma in the right hepatic lobe. The unenhanced liver is unremarkable given limitations of a noncontrast study. No biliary dilatation is noted. The gallbladder is normal in appearance. Pancreas: Normal Spleen: Normal Adrenals/Urinary Tract: Normal bilateral adrenal glands. Nonspecific mild perinephric fat stranding with renovascular calcifications bilaterally. No obstructive uropathy, renal mass or nephrolithiasis. Stable 14 mm cystic focus off the lower pole the left kidney with associated mild renal cortical scarring. Stomach/Bowel: Stomach is within normal limits. Small hiatal hernia. Appendix appears normal. No evidence of bowel wall thickening, distention, or inflammatory changes. Scattered colonic diverticulosis without acute diverticulitis is identified. Vascular/Lymphatic: Aortoiliac atherosclerosis. No adenopathy by CT size  criteria. Reproductive: Unremarkable prostate and seminal vesicles. Other: No free air or free fluid. Musculoskeletal: No acute or significant osseous findings. IMPRESSION: 1. Calcified pleural plaque at each lung base consistent with prior asbestos exposure. 2. Stable 14 mm cystic focus off the lower pole the left kidney with associated renal cortical scarring. No nephrolithiasis nor obstructive uropathy. No CT findings to explain the patient's renal failure. 3. Colonic diverticulosis without acute diverticulitis. Aortic Atherosclerosis (ICD10-I70.0). Electronically Signed   By: Ashley Royalty M.D.   On: 04/14/2018 16:59    Assessment/Plan:  Presumed acute kidney injury with no evidence of hydronephrosis preceding history of weakness and fatigue for 2 months.  We have no data points except for 12 months ago when renal function was normal.  We are awaiting results of urinalysis and microscopy.  The differential at this point appears to point towards volume depletion and possible acute tubular necrosis.  Hepatorenal syndrome would also need to be considered in view of the long history of alcohol use.  There is no evidence of  ascites or stigmata of portal hypertension which would make this unlikely.  He has good urine output and the urine in the room appeared clear nonbloody to the naked eye.  This may indeed be chronic renal insufficiency from an undefined illness.  We shall support patient with IV hydration at this point and follow his urine output and renal function closely.  There are no indications for dialysis at this time.  I have recommended that we check a serum protein electrophoresis and spot urine immunofixation.  Hypotension.  Appears to be volume depletion with history of atrial fibrillation rapid ventricular rate will start provide support to patient with IV hydration.  Metabolic acidosis.  Will start IV sodium bicarbonate 75 cc an hour.  Anemia no issue at this time  Bones will check PTH this  may be helpful to evaluate the difference between chronic and acute injury  Atrial fibrillation rapid ventricular rate appears to be supratherapeutic.  Would recommend troponin 2D echo and TSH.  Supratherapeutic INR secondary to exogenous use of Coumadin  Elevated liver enzymes.  These seem to have resolved.  I suspect chronic alcohol use has been the etiology.  Avoid nephrotoxins ACE inhibitors ARB use nonsteroidal anti-inflammatory drugs Cox 2 inhibitors and IV contrast.   LOS: 0 Sherril Croon $RemoveBefor'@TODAY'[id number]$ $Rem'@7'ONUr$ :52 PM

## 2018-04-14 NOTE — ED Provider Notes (Signed)
[facility] EMERGENCY DEPARTMENT Provider Note   CSN: [id number] Arrival date & time: 04/14/18  1222     History   Chief Complaint Chief Complaint  Patient presents with  . Abnormal Labs    HPI Daniel Morse is a 78 y.o. male.  Patient with history of atrial flutter on chronic anticoagulation with Coumadin presents with approximately 1 to 80-month history of nausea, decreased oral intake and weight loss.  Patient was evaluated by his PCP and GI doctor in [place].  He had elevated liver enzymes and elevated bilirubin.  Patient had work-up with ultrasound showing hepatic steatosis.  He was scheduled for liver biopsy today.  INR performed prior to procedure showed INR equal to 9.  Procedure was canceled due to this.  Patient states that he has not been taking his Coumadin for 1 week as he was told to stop it.  Patient denies any black or tarry stools, bloody stools, easy bruising or bleeding, blood in the urine.  He is not having any hemoptysis or hematemesis.  Patient has a history of alcohol use and reportedly quit in December 2019.  Otherwise patient is on metoprolol for high blood pressure and diltiazem for rate control blood pressure.  Family reports that the patient has been more confused at times recently, for instance talking to himself inappropriately.  Previous carotid endarterectomy but no previous strokes.  Patient denies any history of abdominal surgeries.     Past Medical History:  Diagnosis Date  . Atrial flutter ([place]) 06/2010  . Cerebrovascular disease 2008   Left CEA  . Chest pain    negative stress nuclear in 11/2002  . Chronic anticoagulation   . Dysrhythmia   . GERD (gastroesophageal reflux disease)    h/o esophagitis  . History of colonic polyps    Colonoscopy and polypectomy in 03/2011  . History of kidney stones   . Hyperlipidemia    Metabolic syndrome with increased triglycerides  . Hypertension   . Macular degeneration    legally  blind  . Nephrolithiasis   . Peripheral vascular disease ([place])   . Tobacco abuse    50 pack years; 1/2 pack per day      Patient Active Problem List   Diagnosis Date Noted  . Perirectal abscess   . Family hx of colon cancer 04/19/2016  . History of colonic polyps 04/19/2016  . Encounter for therapeutic drug monitoring 04/14/2013  . Hypokalemia 12/03/2012  . Peripheral vascular disease ([place]) 11/05/2012  . Chronic anticoagulation   . Tobacco abuse   . Cerebrovascular disease   . Nephrolithiasis   . Hyperlipidemia   . Hypertension   . GERD (gastroesophageal reflux disease)   . Atrial fibrillation     Past Surgical History:  Procedure Laterality Date  . CAROTID ENDARTERECTOMY  2008   Left  . COLONOSCOPY  04/04/2011   Rogene Houston, MD;  multiple polyps ablated  . COLONOSCOPY N/A 06/12/2016   Procedure: COLONOSCOPY;  Surgeon: Rogene Houston, MD;  [facility];  Service: Endoscopy;  Laterality: N/A;  1030  . CYSTOSCOPY W/ URETERAL STENT PLACEMENT     and stone extraction        Home Medications    Prior to Admission medications   Medication Sig Start Date End Date Taking? Authorizing Provider  diltiazem (CARDIZEM) 60 MG tablet Take 1 tablet (60 mg total) by mouth 2 (two) times daily. 11/06/17   Arnoldo Lenis, MD  levothyroxine (SYNTHROID, LEVOTHROID) 50  MCG tablet Take 50 mcg by mouth every morning. 03/19/17   [provider]  metoprolol tartrate (LOPRESSOR) 50 MG tablet Take 1 tablet (50 mg total) by mouth 2 (two) times daily. 01/12/18   Arnoldo Lenis, MD  pantoprazole (PROTONIX) 40 MG tablet TAKE 1 TABLET BY MOUTH ONCE DAILY 01/12/18   Rogene Houston, MD  Specialty Vitamins Products (ICAPS LUTEIN-ZEAXANTHIN PO) Take 1 tablet by mouth 2 (two) times daily.     [provider]  warfarin (COUMADIN) 2.5 MG tablet Take 1 tablet daily except 1 1/2 tablets on Tuesdays, Thursdays and Saturdays Patient taking differently: Take 2.5-3.75 mg by  mouth See admin instructions. Take 1 tablet daily except 1 1/2 tablets on Tuesdays, Thursdays and Saturdays 02/04/17   Arnoldo Lenis, MD    Family History Family History  Problem Relation Age of Onset  . Heart attack Father   . Hypertension Sister   . Heart attack Brother   . Colon cancer Paternal Uncle   . Heart attack Brother     Social History Social History   Tobacco Use  . Smoking status: Former Smoker    Packs/day: 0.50    Years: 50.00    Pack years: 25.00    Types: Cigarettes    Start date: 11/06/1978    Last attempt to quit: 11/05/2009    Years since quitting: 8.4  . Smokeless tobacco: Former Systems developer    Quit date: 06/01/2010  Substance Use Topics  . Alcohol use: No    Alcohol/week: 0.0 standard drinks    Comment: occasionally  . Drug use: No     Allergies   Patient has no known allergies.   Review of Systems Review of Systems  Constitutional: Positive for appetite change and unexpected weight change. Negative for fever.  HENT: Negative for rhinorrhea and sore throat.   Eyes: Negative for redness.  Respiratory: Negative for cough and shortness of breath.   Cardiovascular: Negative for chest pain.  Gastrointestinal: Positive for nausea. Negative for abdominal pain, blood in stool, diarrhea and vomiting.  Genitourinary: Negative for difficulty urinating, dysuria and hematuria.  Musculoskeletal: Negative for myalgias.  Skin: Negative for rash.  Neurological: Positive for weakness. Negative for headaches.     Physical Exam Updated Vital Signs BP 104/67 (BP Location: Left Arm)   Pulse (!) 101   Temp 97.8 F (36.6 C) (Oral)   Resp 18   SpO2 98%   Physical Exam Vitals signs and nursing note reviewed.  Constitutional:      Appearance: He is well-developed.  HENT:     Head: Normocephalic and atraumatic.     Right Ear: Tympanic membrane, ear canal and external ear normal.     Left Ear: Tympanic membrane, ear canal and external ear normal.     Nose: Nose  normal.     Mouth/Throat:     Mouth: Mucous membranes are dry.     Pharynx: Uvula midline.  Eyes:     General: Lids are normal.        Right eye: No discharge.        Left eye: No discharge.     Conjunctiva/sclera: Conjunctivae normal.     Pupils: Pupils are equal, round, and reactive to light.  Neck:     Musculoskeletal: Normal range of motion and neck supple.  Cardiovascular:     Rate and Rhythm: Regular rhythm. Tachycardia present.     Heart sounds: Normal heart sounds.  Pulmonary:     Effort: Pulmonary  effort is normal.     Breath sounds: Normal breath sounds.  Abdominal:     Palpations: Abdomen is soft.     Tenderness: There is no abdominal tenderness.  Musculoskeletal: Normal range of motion.     Cervical back: He exhibits normal range of motion, no tenderness and no bony tenderness.  Skin:    General: Skin is warm and dry.  Neurological:     Mental Status: He is alert and oriented to person, place, and time.     GCS: GCS eye subscore is 4. GCS verbal subscore is 5. GCS motor subscore is 6.     Cranial Nerves: No cranial nerve deficit.     Sensory: No sensory deficit.     Motor: No abnormal muscle tone.     Coordination: Coordination normal.     Comments: No asterixis.      ED Treatments / Results  Labs (all labs ordered are listed, but only abnormal results are displayed) Labs Reviewed  CBC WITH DIFFERENTIAL/PLATELET - Abnormal; Notable for the following components:      Result Value   WBC 15.7 (*)    Platelets 440 (*)    Neutro Abs 13.0 (*)    Abs Immature Granulocytes 0.15 (*)    All other components within normal limits  COMPREHENSIVE METABOLIC PANEL - Abnormal; Notable for the following components:   Sodium 134 (*)    CO2 15 (*)    BUN 109 (*)    Creatinine, Ser 8.46 (*)    Calcium 7.5 (*)    Albumin 1.8 (*)    AST 50 (*)    Total Bilirubin 1.7 (*)    GFR calc non Af Amer 6 (*)    GFR calc Af Amer 6 (*)    Anion gap 18 (*)    All other components  within normal limits  PROTIME-INR - Abnormal; Notable for the following components:   Prothrombin Time 72.9 (*)    INR 9.16 (*)    All other components within normal limits  APTT - Abnormal; Notable for the following components:   aPTT 79 (*)    All other components within normal limits  URINE CULTURE  AMMONIA  PSA    EKG None  Radiology No results found.  Procedures Procedures (including critical care time)  Medications Ordered in ED Medications  phytonadione (VITAMIN K) 5 mg in dextrose 5 % 50 mL IVPB (has no administration in time range)  thiamine (VITAMIN B-1) tablet 100 mg (100 mg Oral Given 04/14/18 1401)  sodium chloride 0.9 % bolus 500 mL (500 mLs Intravenous New Bag/Given 04/14/18 1359)     Initial Impression / Assessment and Plan / ED Course  I have reviewed the triage vital signs and the nursing notes.  Pertinent labs & imaging results that were available during my care of the patient were reviewed by me and considered in my medical decision making (see chart for details).     Patient seen and examined. Work-up initiated.   Vital signs reviewed and are as follows: BP 104/67 (BP Location: Left Arm)   Pulse (!) 101   Temp 97.8 F (36.6 C) (Oral)   Resp 18   SpO2 98%   3:04 PM work-up reviewed.  INR confirmed greater than 9.  New acute kidney injury noted with creatinine greater than 8.  Patient states that he is continuing to have good urination although decreased due to poor oral intake.  Family and patient updated.  Will need admission  for these problems and further work-up.    3:25 PM Spoke with Dr. Evangeline Gula who requests CT renal to eval kidneys, PSA, Urine C&S -- these have been ordered.   Threasa Alpha PA-C will follow-up on results.   CRITICAL CARE Performed by: Carlisle Cater PA-C Total critical care time: 35 minutes Critical care time was exclusive of separately billable procedures and treating other patients. Critical care was necessary to treat or  prevent imminent or life-threatening deterioration. Critical care was time spent personally by me on the following activities: development of treatment plan with patient and/or surrogate as well as nursing, discussions with consultants, evaluation of patient's response to treatment, examination of patient, obtaining history from patient or surrogate, ordering and performing treatments and interventions, ordering and review of laboratory studies, ordering and review of radiographic studies, pulse oximetry and re-evaluation of patient's condition.   Final Clinical Impressions(s) / ED Diagnoses   Final diagnoses:  Acute kidney injury ([place])  Supratherapeutic INR   Admit.   ED Discharge Orders    None       Carlisle Cater, Hershal Coria 04/14/18 [place], Gwenyth Allegra, MD 04/14/18 [phone]

## 2018-04-14 NOTE — ED Notes (Signed)
Family members provided contact info as listed Ledon Weihe- Wife Home [phone] Cell [phone]. Daughter cell [phone] Home [phone]

## 2018-04-14 NOTE — H&P (Signed)
History and Physical   Patient drank very heavily until 8 weeks ago.  His wife would hide in another room.  Please do not let the patient know that we know he drinks.  Family is concerned about wife's safety.  However this sheds significant light on his current clinical situation.   Daniel Morse YQI:[id number] DOB: [birth year] DOA: 04/14/2018  PCP: Sharilyn Sites, MD  Patient coming from: Home  I have personally briefly reviewed patient's old medical records in [place]  Chief Complaint: Abnormal labs  HPI: Daniel Morse is a 78 y.o. male with medical history significant of atrial fibrillation on chronic Coumadin therapy, hypertension, hyperlipidemia, gastroesophageal reflux disease, kidney stones who scheduled to have a liver biopsy today to further evaluate weight loss, decreased oral intake and an ultrasound showing hepatic steatosis.  On presentation for biopsy patient was found to have an INR of 9 and was sent to the emergency department for evaluation.  He denies any bleeding he has had no chest pain or palpitations no shortness of breath and does report some fatigue.  On further evaluation his creatinine was found to be in the 8-1/2 range and he was referred to me for further evaluation and management  ED Course: Pressure soft, tachycardic, severe kidney injury with a creat  of 8-1/2 or so.  INR of 9.6 EKG shows atrial fibrillation.  Recent ultrasound of the scrotum showed epididymitis  Review of Systems: As per HPI otherwise all other systems reviewed and  negative.  Past Medical History:  Diagnosis Date  . Atrial flutter ([place]) 06/2010  . Cerebrovascular disease 2008   Left CEA  . Chest pain    negative stress nuclear in 11/2002  . Chronic anticoagulation   . Dysrhythmia   . GERD (gastroesophageal reflux disease)    h/o esophagitis  . History of colonic polyps    Colonoscopy and polypectomy in 03/2011  . History of kidney stones   . Hyperlipidemia    Metabolic  syndrome with increased triglycerides  . Hypertension   . Macular degeneration    legally blind  . Nephrolithiasis   . Peripheral vascular disease ([place])   . Tobacco abuse    50 pack years; 1/2 pack per day    Past Surgical History:  Procedure Laterality Date  . CAROTID ENDARTERECTOMY  2008   Left  . COLONOSCOPY  04/04/2011   Rogene Houston, MD;  multiple polyps ablated  . COLONOSCOPY N/A 06/12/2016   Procedure: COLONOSCOPY;  Surgeon: Rogene Houston, MD;  Location[facility];  Service: Endoscopy;  Laterality: N/A;  1030  . CYSTOSCOPY W/ URETERAL STENT PLACEMENT     and stone extraction    Social History   Social History Narrative  . Not on file     reports that he quit smoking about 8 years ago. His smoking use included cigarettes. He started smoking about 39 years ago. He has a 25.00 pack-year smoking history. He quit smokeless tobacco use about 7 years ago. He reports current alcohol use of about 20.0 standard drinks of alcohol per week. He reports that he does not use drugs.  Do not notify patient that we know that he drank alcohol.  His wife's safety is at risk.  Can become a volatile/violent drinker.  Gets very angry with her if we disclosed that we are aware of his prior/possibly current private drinking.   Allergies  Allergen Reactions  . Statins Swelling    Legs hurt  Family History  Problem Relation Age of Onset  . Heart attack Father   . Hypertension Sister   . Heart attack Brother   . Colon cancer Paternal Uncle   . Heart attack Brother      Prior to Admission medications   Medication Sig Start Date End Date Taking? Authorizing Provider  diltiazem (CARDIZEM) 60 MG tablet Take 1 tablet (60 mg total) by mouth 2 (two) times daily. 11/06/17  Yes Branch, Alphonse Guild, MD  levothyroxine (SYNTHROID, LEVOTHROID) 50 MCG tablet Take 50 mcg by mouth every morning. 03/19/17  Yes [provider]  metoprolol tartrate (LOPRESSOR) 50 MG tablet Take 1 tablet  (50 mg total) by mouth 2 (two) times daily. 01/12/18  Yes Branch, Alphonse Guild, MD  pantoprazole (PROTONIX) 40 MG tablet TAKE 1 TABLET BY MOUTH ONCE DAILY Patient taking differently: Take 40 mg by mouth.  01/12/18  Yes [name], Mechele Dawley, MD  Specialty Vitamins Products (ICAPS LUTEIN-ZEAXANTHIN PO) Take 1 tablet by mouth 2 (two) times daily.    Yes [provider]  warfarin (COUMADIN) 2.5 MG tablet Take 1 tablet daily except 1 1/2 tablets on Tuesdays, Thursdays and Saturdays Patient taking differently: Take 2.5-3.75 mg by mouth See admin instructions. Take 1 tablet daily except 1 1/2 tablets on Tuesdays, Thursdays and Saturdays 02/04/17  Yes Branch, Alphonse Guild, MD    Physical Exam:  Constitutional: NAD, calm, comfortable Vitals:   04/14/18 1715 04/14/18 1730 04/14/18 1745 04/14/18 1800  BP: 100/69 116/78 110/76 110/74  Pulse: (!) 104 (!) 116 98 (!) 117  Resp: $Remo'14 11 14 18  'xTRly$ Temp:      TempSrc:      SpO2: 96% 96% 96% 95%   Eyes: PERRL, lids and conjunctivae injected and red ENMT: Mucous membranes are moist. Posterior pharynx clear of any exudate or lesions.Normal dentition.  Neck: normal, supple, no masses, no thyromegaly Respiratory: clear to auscultation bilaterally, no wheezing, no crackles. Normal respiratory effort. No accessory muscle use.  Cardiovascular: Regular rate and rhythm, no murmurs / rubs / gallops. No extremity edema. 2+ pedal pulses. No carotid bruits.  Abdomen: no tenderness, no masses palpated. No splenomegaly. Bowel sounds positive.  Mild hepatomegaly Musculoskeletal: no clubbing / cyanosis. No joint deformity upper and lower extremities. Good ROM, no contractures. Normal muscle tone.  Skin: Spider angiomata of the chest, no rashes, lesions, ulcers. No induration Neurologic: CN 2-12 grossly intact. Sensation intact, DTR normal. Strength 5/5 in all 4.  Psychiatric: Normal judgment and insight. Alert and oriented x 3. Normal mood.    Labs on Admission: I have  personally reviewed following labs and imaging studies  CBC: Recent Labs  Lab 04/14/18 0936 04/14/18 1323  WBC 17.5* 15.7*  NEUTROABS  --  13.0*  HGB 14.6 13.3  HCT 44.2 39.1  MCV 96.5 91.1  PLT 550* 696*   Basic Metabolic Panel: Recent Labs  Lab 04/14/18 1323  NA 134*  K 4.6  CL 101  CO2 15*  GLUCOSE 76  BUN 109*  CREATININE 8.46*  CALCIUM 7.5*   GFR: Estimated Creatinine Clearance: 8 mL/min (A) (by C-G formula based on SCr of 8.46 mg/dL (H)). Liver Function Tests: Recent Labs  Lab 04/14/18 1323  AST 50*  ALT 40  ALKPHOS 71  BILITOT 1.7*  PROT 8.1  ALBUMIN 1.8*   No results for input(s): LIPASE, AMYLASE in the last 168 hours. Recent Labs  Lab 04/14/18 1323  AMMONIA 12   Coagulation Profile: Recent Labs  Lab 04/14/18 1023 04/14/18  1323  INR 9.01* 9.16*   Urine analysis:    Component Value Date/Time   COLORURINE AMBER (A) 02/23/2014 0225   APPEARANCEUR CLEAR 02/23/2014 0225   LABSPEC 1.020 02/23/2014 0225   PHURINE 6.5 02/23/2014 0225   GLUCOSEU NEGATIVE 02/23/2014 0225   HGBUR TRACE (A) 02/23/2014 0225   BILIRUBINUR SMALL (A) 02/23/2014 0225   KETONESUR TRACE (A) 02/23/2014 0225   PROTEINUR TRACE (A) 02/23/2014 0225   UROBILINOGEN 2.0 (H) 02/23/2014 0225   NITRITE NEGATIVE 02/23/2014 0225   LEUKOCYTESUR NEGATIVE 02/23/2014 0225    Radiological Exams on Admission: Ct Head Wo Contrast  Result Date: 04/14/2018 CLINICAL DATA:  Elevated INR with altered level of consciousness and confusion. EXAM: CT HEAD WITHOUT CONTRAST TECHNIQUE: Contiguous axial images were obtained from the base of the skull through the vertex without intravenous contrast. COMPARISON:  Head CT 05/10/2012 FINDINGS: Brain: Stable involutional changes of brain with chronic microvascular ischemic disease of white matter. No acute large vascular territory infarction, hemorrhage or mass. No mass effect. No hydrocephalus. Midline fourth ventricle and basal cisterns. Brainstem and  cerebellum are nonacute. Vascular: Atherosclerosis of the carotid siphons. Skull: Intact without suspicious osseous lesions or fracture. Sinuses/Orbits: Intact Other: None IMPRESSION: No acute intracranial abnormality. 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 Electronically Signed   By: Tollie Eth M.D.   On: 04/14/2018 16:48   Ct Renal Stone Study  Result Date: 04/14/2018 CLINICAL DATA:  Acute renal failure EXAM: CT ABDOMEN AND PELVIS WITHOUT CONTRAST TECHNIQUE: Multidetector CT imaging of the abdomen and pelvis was performed following the standard protocol without IV contrast. COMPARISON:  02/23/2014 FINDINGS: Lower chest: Calcified pleural plaque is identified at each lung base consistent prior asbestos exposure. Coronary arteriosclerosis of the included heart is noted without pericardial effusion or thickening. Hepatobiliary: Calcified granuloma in the right hepatic lobe. The unenhanced liver is unremarkable given limitations of a noncontrast study. No biliary dilatation is noted. The gallbladder is normal in appearance. Pancreas: Normal Spleen: Normal Adrenals/Urinary Tract: Normal bilateral adrenal glands. Nonspecific mild perinephric fat stranding with renovascular calcifications bilaterally. No obstructive uropathy, renal mass or nephrolithiasis. Stable 14 mm cystic focus off the lower pole the left kidney with associated mild renal cortical scarring. Stomach/Bowel: Stomach is within normal limits. Small hiatal hernia. Appendix appears normal. No evidence of bowel wall thickening, distention, or inflammatory changes. Scattered colonic diverticulosis without acute diverticulitis is identified. Vascular/Lymphatic: Aortoiliac atherosclerosis. No adenopathy by CT size criteria. Reproductive: Unremarkable prostate and seminal vesicles. Other: No free air or free fluid. Musculoskeletal: No acute or significant osseous findings. IMPRESSION: 1. Calcified pleural plaque at each lung base consistent  with prior asbestos exposure. 2. Stable 14 mm cystic focus off the lower pole the left kidney with associated renal cortical scarring. No nephrolithiasis nor obstructive uropathy. No CT findings to explain the patient's renal failure. 3. Colonic diverticulosis without acute diverticulitis. Aortic Atherosclerosis (ICD10-I70.0). Electronically Signed   By: Tollie Eth M.D.   On: 04/14/2018 16:59    EKG: Independently reviewed.  Fibrillation with left axis deviation, left anterior fascicular block, increased QT interval and possible old anterior infarction when compared to 04/25/2017 there is no change.  Assessment/Plan Principal Problem:   Acute kidney failure (HCC) Active Problems:   Warfarin toxicity   Hepatic steatosis   Hyperlipidemia   Hypertension   GERD (gastroesophageal reflux disease)   Atrial fibrillation   Alcohol abuse, in remission   1.  Acute kidney failure: Etiology is unclear however the patient has had some recent  testing with a CT scan back in January.  He did receive IV dye at that point but his renal function was normal.  Clearly he has had some insult to his kidney he will be admitted into the hospital he may have developed some ATN.  We will hydrate the patient and monitor renal failure.  2.  Hepatic steatosis: Etiology is unclear may be related to prior heavy alcohol drinking.  Patient states he has quit drinking.  However his INR is markedly elevated and he has had recent episode of what appeared to be some hepatitis that may have been alcoholic hepatitis.  Unsure as the etiology.  Would recommend follow-up with as an outpatient for biopsy.  3.  Hyperlipidemia: Continue present care.  4.  Hypertension: Pressures are soft.  We will hold blood pressure medication for now.  5.  Gastroesophageal reflux disease: Continue proton pump inhibitor.  6.  Atrial fibrillation: Possibly related to holiday heart now chronic.  Patient currently cannot be anticoagulated as his INR is  markedly elevated at 9.6 despite 1 week of holding his anticoagulation.  Will monitor while in the hospital.  7.  Alcohol abuse in remission: (Hopefully) has a history of being a volatile and possibly violent drinker.  When I spoke to the patient's daughter she was terrified at the idea of the patient's finding out that his family disclosed his drinking to [place].  However it was extremely obvious to me based on physical examination his body habitus and his history of elevated INR despite having stopped a week ago.  Also the fact that his drinking has either stopped or cut back significantly and his hepatitis seems to have improved.  At this point we will continue to monitor.  DVT prophylaxis: Currently anticoagulated none. Code Status: Full code Family Communication: Spoke with patient's daughter and wife through the daughter by telephone this evening.  Patient retains capacity. Disposition Plan: Discharge in 3 to 4 days Consults called: Nephrology, Dr. Justin Mend Admission status: Inpatient   Lady Deutscher MD [place] [facility] Pager [phone]  How to contact the [facility] Attending or Consulting provider [place] or covering provider during after hours [place], for this patient?  1. Check the care team in [facility] and look for a) attending/consulting [facility] provider listed and b) the [facility] team listed 2. Log into [email or web address] and use [place]'s universal password to access. If you do not have the password, please contact the hospital operator. 3. Locate the [facility] provider you are looking for under [facility] and page to a number that you can be directly reached. 4. If you still have difficulty reaching the provider, please page the [facility] (Director on Call) for the Hospitalists listed on amion for assistance.  If 7PM-7AM, please contact night-coverage [email or web address] Password [facility]  04/14/2018, 7:06 PM

## 2018-04-14 NOTE — Progress Notes (Signed)
Patient heart rate is 145bpm, MD Justin Mend paged.Metoprolol will be given per order. Will continue to monitor.

## 2018-04-14 NOTE — Progress Notes (Signed)
Lab called PT and PTT hemolyzed  new order placed to redraw

## 2018-04-14 NOTE — H&P (Signed)
Chief Complaint: Patient was seen in consultation today for liver core biopsy at the request of [name],Najeeb U  Referring Physician(s): [name],Najeeb U  Supervising Physician: Markus Daft  Patient Status: [facility] - Out-pt  History of Present Illness: Daniel Morse is a 78 y.o. male   Elevated LFTs Known for few months PMD Dr Hilma Favors follows pt for same  Functions are rising [place] 04/03/17:  IMPRESSION: 1. Mildly echogenic liver likely reflecting steatosis. 2. No gallstones or biliary dilatation. 3. 2.1 cm left renal cyst.  Nausea and weakening worsening at home over last several days Has not eaten well in weeks Wt loss definite Night sweats Painless scrotal swelling [place] 04/06/18:  IMPRESSION: No definite evidence of testicular torsion. Small complex cysts are noted in both testicles. Left epididymis is enlarged and hypervascular suggesting epididymitis. Large multi-septated complex left hydrocele is noted which may be inflammatory in etiology.  Coumadin for A flutter Last dose 1 week ago  Scheduled now for random core liver biopsy Per Dr Laural Golden   Past Medical History:  Diagnosis Date  . Atrial flutter ([place]) 06/2010  . Cerebrovascular disease 2008   Left CEA  . Chest pain    negative stress nuclear in 11/2002  . Chronic anticoagulation   . Dysrhythmia   . GERD (gastroesophageal reflux disease)    h/o esophagitis  . History of colonic polyps    Colonoscopy and polypectomy in 03/2011  . History of kidney stones   . Hyperlipidemia    Metabolic syndrome with increased triglycerides  . Hypertension   . Macular degeneration    legally blind  . Nephrolithiasis   . Peripheral vascular disease ([place])   . Tobacco abuse    50 pack years; 1/2 pack per day    Past Surgical History:  Procedure Laterality Date  . CAROTID ENDARTERECTOMY  2008   Left  . COLONOSCOPY  04/04/2011   Rogene Houston, MD;  multiple polyps ablated  . COLONOSCOPY N/A 06/12/2016   Procedure: COLONOSCOPY;  Surgeon: Rogene Houston, MD;  Location: [facility] SUITE;  Service: Endoscopy;  Laterality: N/A;  1030  . CYSTOSCOPY W/ URETERAL STENT PLACEMENT     and stone extraction    Allergies: Patient has no known allergies.  Medications: Prior to Admission medications   Medication Sig Start Date End Date Taking? Authorizing Provider  diltiazem (CARDIZEM) 60 MG tablet Take 1 tablet (60 mg total) by mouth 2 (two) times daily. 11/06/17  Yes Branch, Alphonse Guild, MD  levothyroxine (SYNTHROID, LEVOTHROID) 50 MCG tablet Take 50 mcg by mouth every morning. 03/19/17  Yes [provider]  metoprolol tartrate (LOPRESSOR) 50 MG tablet Take 1 tablet (50 mg total) by mouth 2 (two) times daily. 01/12/18  Yes Branch, Alphonse Guild, MD  pantoprazole (PROTONIX) 40 MG tablet TAKE 1 TABLET BY MOUTH ONCE DAILY 01/12/18  Yes [name], Mechele Dawley, MD  Specialty Vitamins Products (ICAPS LUTEIN-ZEAXANTHIN PO) Take 1 tablet by mouth 2 (two) times daily.    Yes [provider]  warfarin (COUMADIN) 2.5 MG tablet Take 1 tablet daily except 1 1/2 tablets on Tuesdays, Thursdays and Saturdays Patient taking differently: Take 2.5-3.75 mg by mouth See admin instructions. Take 1 tablet daily except 1 1/2 tablets on Tuesdays, Thursdays and Saturdays 02/04/17   Arnoldo Lenis, MD     Family History  Problem Relation Age of Onset  . Heart attack Father   . Hypertension Sister   . Heart attack Brother   . Colon cancer Paternal  Uncle   . Heart attack Brother     Social History   Socioeconomic History  . Marital status: Married    Spouse name: Not on file  . Number of children: Not on file  . Years of education: Not on file  . Highest education level: Not on file  Occupational History  . Not on file  Social Needs  . Financial resource strain: Not on file  . Food insecurity:    Worry: Not on file    Inability: Not on file  . Transportation needs:    Medical: Not on file    Non-medical:  Not on file  Tobacco Use  . Smoking status: Former Smoker    Packs/day: 0.50    Years: 50.00    Pack years: 25.00    Types: Cigarettes    Start date: 11/06/1978    Last attempt to quit: 11/05/2009    Years since quitting: 8.4  . Smokeless tobacco: Former Systems developer    Quit date: 06/01/2010  Substance and Sexual Activity  . Alcohol use: No    Alcohol/week: 0.0 standard drinks    Comment: occasionally  . Drug use: No  . Sexual activity: Not on file  Lifestyle  . Physical activity:    Days per week: Not on file    Minutes per session: Not on file  . Stress: Not on file  Relationships  . Social connections:    Talks on phone: Not on file    Gets together: Not on file    Attends religious service: Not on file    Active member of club or organization: Not on file    Attends meetings of clubs or organizations: Not on file    Relationship status: Not on file  Other Topics Concern  . Not on file  Social History Narrative  . Not on file    Review of Systems: A 12 point ROS discussed and pertinent positives are indicated in the HPI above.  All other systems are negative.  Review of Systems  Constitutional: Positive for activity change, appetite change, fatigue and unexpected weight change. Negative for fever.  Respiratory: Negative for cough.   Cardiovascular: Negative for chest pain.  Gastrointestinal: Positive for abdominal distention.  Musculoskeletal: Positive for gait problem.  Neurological: Positive for weakness.  Psychiatric/Behavioral: Negative for behavioral problems and confusion.    Vital Signs: BP 97/70   Pulse 97   Temp 97.8 F (36.6 C)   Ht $R'5\' 6"'vV$  (1.676 m)   Wt 200 lb (90.7 kg)   BMI 32.28 kg/m   Physical Exam Vitals signs reviewed.  Cardiovascular:     Rate and Rhythm: Normal rate and regular rhythm.     Heart sounds: Normal heart sounds.  Pulmonary:     Breath sounds: Wheezing present.  Abdominal:     General: There is distension.     Palpations: Abdomen  is soft.  Musculoskeletal: Normal range of motion.  Skin:    General: Skin is warm and dry.  Neurological:     Mental Status: He is alert and oriented to person, place, and time.  Psychiatric:        Mood and Affect: Mood normal.        Behavior: Behavior normal.        Thought Content: Thought content normal.        Judgment: Judgment normal.     Imaging: Dg Chest 2 View  Result Date: 04/06/2018 CLINICAL DATA:  Dry cough 6 weeks. EXAM:  CHEST - 2 VIEW COMPARISON:  Chest x-ray FINDINGS: Bilateral densities are stable, presumably pleural. Nodular densities are present upper lobes stable. Superimposed airspace disease is present. Heart is mildly enlarged.  There is no edema or effusion. IMPRESSION: No active cardiopulmonary disease. Electronically Signed   By: San Morelle M.D.   On: 04/06/2018 12:54   US Abdomen Complete  Result Date: 04/03/2018 CLINICAL DATA:  Elevated liver enzymes. EXAM: ABDOMEN ULTRASOUND COMPLETE COMPARISON:  CT abdomen and pelvis 02/23/2014 FINDINGS: Gallbladder: No gallstones or wall thickening visualized. No sonographic Murphy sign noted by sonographer. Common bile duct: Diameter: 4 mm Liver: Mildly increased parenchymal echogenicity diffusely without focal abnormality identified. Portal vein is patent on color Doppler imaging with normal direction of blood flow towards the liver. IVC: No abnormality visualized. Pancreas: Visualized portion unremarkable. Spleen: Size and appearance within normal limits. Right Kidney: Length: 11.4 cm. Echogenicity within normal limits. No mass or hydronephrosis visualized. Left Kidney: Length: 11.3 cm. Echogenicity within normal limits. No hydronephrosis. 2.1 cm interpolar cystr. Abdominal aorta: 2.9 cm diameter proximally without focal aneurysm an with normal diameter more distally. Other findings: None. IMPRESSION: 1. Mildly echogenic liver likely reflecting steatosis. 2. No gallstones or biliary dilatation. 3. 2.1 cm left renal  cyst. Electronically Signed   By: Logan Bores M.D.   On: 04/03/2018 14:22   US Scrotum W/doppler  Result Date: 04/06/2018 CLINICAL DATA:  Left testicular swelling. EXAM: SCROTAL ULTRASOUND DOPPLER ULTRASOUND OF THE TESTICLES TECHNIQUE: Complete ultrasound examination of the testicles, epididymis, and other scrotal structures was performed. Color and spectral Doppler ultrasound were also utilized to evaluate blood flow to the testicles. COMPARISON:  None. FINDINGS: Right testicle Measurements: 3.6 x 2.2 x 1.4 cm. No mass or microlithiasis visualized. 3 mm cyst is noted. Left testicle Measurements: 4.0 x 2.1 x 1.8 cm 4 mm septated cyst is noted. No mass or microlithiasis visualized. Right epididymis:  Normal in size and appearance. Left epididymis: Increased vascularity of left epididymis is noted suggesting epididymitis. Hydrocele:  Large multi-septated complex left hydrocele is noted. Varicocele:  None visualized. Pulsed Doppler interrogation of both testes demonstrates normal low resistance arterial and venous waveforms bilaterally. IMPRESSION: No definite evidence of testicular torsion. Small complex cysts are noted in both testicles. Left epididymis is enlarged and hypervascular suggesting epididymitis. Large multi-septated complex left hydrocele is noted which may be inflammatory in etiology. Electronically Signed   By: Marijo Conception, M.D.   On: 04/06/2018 13:31    Labs:  CBC: Recent Labs    04/06/18 1327 04/14/18 0936  WBC 16.0* 17.5*  HGB 14.0 14.6  HCT 40.2 44.2  PLT 436* 550*    COAGS: Recent Labs    10/15/17 0959 11/26/17 0947 01/19/18 1116 02/16/18 0955  INR 2.1 2.4 1.9* 2.2    BMP: No results for input(s): NA, K, CL, CO2, GLUCOSE, BUN, CALCIUM, CREATININE, GFRNONAA, GFRAA in the last 8760 hours.  Invalid input(s): CMP  LIVER FUNCTION TESTS: Recent Labs    04/01/18 1029 04/06/18 1327  BILITOT 1.3* 1.4*  AST 106* 89*  ALT 103* 65*  PROT 7.9 7.9    TUMOR  MARKERS: No results for input(s): AFPTM, CEA, CA199, CHROMGRNA in the last 8760 hours.  Assessment and Plan:  Elevated LFTs Persistent; rising Wt loss Weakness Abdominal and scrotal swelling Scheduled for liver core biopsy LD Coumadin 1 week ago  Risks and benefits of liver core biopsy was discussed with the patient and/or patient's family including, but not limited to bleeding, infection, damage  to adjacent structures or low yield requiring additional tests.  All of the questions were answered and there is agreement to proceed. Consent signed and in chart.   Thank you for this interesting consult.  I greatly enjoyed meeting PRYCE FOLTS and look forward to participating in their care.  A copy of this report was sent to the requesting provider on this date.  Electronically Signed: Lavonia Drafts, PA-C 04/14/2018, 10:20 AM   I spent a total of  30 Minutes   in face to face in clinical consultation, greater than 50% of which was counseling/coordinating care for liver core biopsy

## 2018-04-14 NOTE — ED Provider Notes (Signed)
Pt's care turned over to me at 4pm.  Ct pending.    Pt's ct returned.  Dr. Evangeline Gula advised she will admit pt.   Daniel Morse 04/14/18 1723    Virgel Manifold, MD 04/15/18 1228

## 2018-04-15 DIAGNOSIS — E440 Moderate protein-calorie malnutrition: Secondary | ICD-10-CM

## 2018-04-15 LAB — COMPREHENSIVE METABOLIC PANEL
ALT: 41 U/L (ref 0–44)
AST: 51 U/L — ABNORMAL HIGH (ref 15–41)
Albumin: 2 g/dL — ABNORMAL LOW (ref 3.5–5.0)
Alkaline Phosphatase: 84 U/L (ref 38–126)
Anion gap: 13 (ref 5–15)
BUN: 106 mg/dL — ABNORMAL HIGH (ref 8–23)
CHLORIDE: 100 mmol/L (ref 98–111)
CO2: 23 mmol/L (ref 22–32)
Calcium: 7.7 mg/dL — ABNORMAL LOW (ref 8.9–10.3)
Creatinine, Ser: 8.16 mg/dL — ABNORMAL HIGH (ref 0.61–1.24)
GFR calc Af Amer: 7 mL/min — ABNORMAL LOW (ref >60)
GFR calc non Af Amer: 6 mL/min — ABNORMAL LOW (ref >60)
Glucose, Bld: 83 mg/dL (ref 70–99)
Potassium: 4.2 mmol/L (ref 3.5–5.1)
Sodium: 136 mmol/L (ref 135–145)
Total Bilirubin: 2.4 mg/dL — ABNORMAL HIGH (ref 0.3–1.2)
Total Protein: 8.6 g/dL — ABNORMAL HIGH (ref 6.5–8.1)

## 2018-04-15 LAB — URINE CULTURE: Culture: <10000 — AB

## 2018-04-15 LAB — PROTEIN ELECTROPHORESIS, SERUM
A/G Ratio: 0.3 — ABNORMAL LOW (ref 0.7–1.7)
Albumin ELP: 1.8 g/dL — ABNORMAL LOW (ref 2.9–4.4)
Alpha-1-Globulin: 0.3 g/dL (ref 0.0–0.4)
Alpha-2-Globulin: 1.3 g/dL — ABNORMAL HIGH (ref 0.4–1.0)
Beta Globulin: 0.6 g/dL — ABNORMAL LOW (ref 0.7–1.3)
Gamma Globulin: 3.1 g/dL — ABNORMAL HIGH (ref 0.4–1.8)
Globulin, Total: 5.3 g/dL — ABNORMAL HIGH (ref 2.2–3.9)
Total Protein ELP: 7.1 g/dL (ref 6.0–8.5)

## 2018-04-15 LAB — CBC
HCT: 41.7 % (ref 39.0–52.0)
Hemoglobin: 13.8 g/dL (ref 13.0–17.0)
MCH: 30.3 pg (ref 26.0–34.0)
MCHC: 33.1 g/dL (ref 30.0–36.0)
MCV: 91.4 fL (ref 80.0–100.0)
Platelets: 447 10*3/uL — ABNORMAL HIGH (ref 150–400)
RBC: 4.56 MIL/uL (ref 4.22–5.81)
RDW: 13.4 % (ref 11.5–15.5)
WBC: 16.7 10*3/uL — ABNORMAL HIGH (ref 4.0–10.5)
nRBC: 0 % (ref 0.0–0.2)

## 2018-04-15 LAB — PROTIME-INR
INR: 1.84
Prothrombin Time: 21 seconds — ABNORMAL HIGH (ref 11.4–15.2)

## 2018-04-15 LAB — URINALYSIS, ROUTINE W REFLEX MICROSCOPIC
BILIRUBIN URINE: NEGATIVE
Glucose, UA: NEGATIVE mg/dL
Ketones, ur: NEGATIVE mg/dL
NITRITE: NEGATIVE
Protein, ur: NEGATIVE mg/dL
Specific Gravity, Urine: 1.006 (ref 1.005–1.030)
pH: 6 (ref 5.0–8.0)

## 2018-04-15 LAB — PROTEIN ELECTRO, RANDOM URINE
ALBUMIN ELP UR: 25.8 %
Alpha-1-Globulin, U: 0.9 %
Alpha-2-Globulin, U: 3.6 %
Beta Globulin, U: 17.6 %
Gamma Globulin, U: 52.1 %
M Component, Ur: 9.7 % — ABNORMAL HIGH
Total Protein, Urine: 22.2 mg/dL

## 2018-04-15 LAB — TROPONIN I
Troponin I: <0.03 ng/mL (ref <0.03)
Troponin I: <0.03 ng/mL (ref <0.03)

## 2018-04-15 MED ORDER — ENSURE ENLIVE PO LIQD
237.0000 mL | Freq: Two times a day (BID) | ORAL | Status: DC
  Administered 2018-04-17 – 2018-04-26 (×12): 237 mL via ORAL

## 2018-04-15 MED ORDER — DILTIAZEM HCL 25 MG/5ML IV SOLN
10.0000 mg | Freq: Once | INTRAVENOUS | Status: AC
  Administered 2018-04-15: 10 mg via INTRAVENOUS
  Filled 2018-04-15: qty 5

## 2018-04-15 MED ORDER — SODIUM BICARBONATE 650 MG PO TABS
650.0000 mg | ORAL_TABLET | Freq: Three times a day (TID) | ORAL | Status: DC
  Administered 2018-04-15 – 2018-04-21 (×18): 650 mg via ORAL
  Filled 2018-04-15 (×19): qty 1

## 2018-04-15 MED ORDER — DILTIAZEM HCL-DEXTROSE 100-5 MG/100ML-% IV SOLN (PREMIX)
5.0000 mg/h | INTRAVENOUS | Status: DC
  Administered 2018-04-15: 7.5 mg/h via INTRAVENOUS
  Administered 2018-04-16 (×2): 15 mg/h via INTRAVENOUS
  Administered 2018-04-16: 10 mg/h via INTRAVENOUS
  Administered 2018-04-16 – 2018-04-17 (×2): 15 mg/h via INTRAVENOUS
  Filled 2018-04-15 (×6): qty 100

## 2018-04-15 MED ORDER — SODIUM CHLORIDE 0.9 % IV BOLUS: 1000.0000 mL | Freq: Once | INTRAVENOUS | Status: AC

## 2018-04-15 MED ORDER — DILTIAZEM HCL 60 MG PO TABS: 60.0000 mg | ORAL_TABLET | Freq: Four times a day (QID) | ORAL | Status: DC

## 2018-04-15 MED ORDER — WARFARIN SODIUM 2.5 MG PO TABS
2.5000 mg | ORAL_TABLET | Freq: Once | ORAL | Status: AC
  Administered 2018-04-15: 2.5 mg via ORAL
  Filled 2018-04-15: qty 1

## 2018-04-15 MED ORDER — WARFARIN - PHARMACIST DOSING INPATIENT
Freq: Every day | Status: DC
  Administered 2018-04-17 – 2018-04-18 (×2)

## 2018-04-15 MED ORDER — SODIUM CHLORIDE 0.9 % IV SOLN
INTRAVENOUS | Status: AC
  Administered 2018-04-15 – 2018-04-16 (×3): via INTRAVENOUS

## 2018-04-15 NOTE — Progress Notes (Signed)
Pt received from [place]. VSS. telmetry applied.CHG complete. Cardizem gtt running w/ HR in 120s. Pt oriented to room and unit.Call light in reach .Will continue to monitor.  Clyde Canterbury, RN

## 2018-04-15 NOTE — Progress Notes (Signed)
[facility] Progress Note   Subjective:   No significant change - no particular complaints this morning.  Has been on bicarb gtt.  No difficulty voiding.  No urinary changes.  UA to be collected still.   Objective Vitals:   04/15/18 0216 04/15/18 0500 04/15/18 0519 04/15/18 0749  BP: 110/76  108/80 111/80  Pulse: (!) 103  88 (!) 113  Resp: $Remo'17  18 15  'enOyf$ Temp: 97.8 F (36.6 C)  (!) 97.4 F (36.3 C) 97.8 F (36.6 C)  TempSrc: Oral  Oral Oral  SpO2: 98%  96% 95%  Weight:  89.9 kg    Height:       Physical Exam General: elderly man lying flat in bed in no distress Eyes: conjunctiva injected BL, L eye blind ENT: MM dry Heart: tachy, irregular, no rub Lungs: normal WOB Abdomen: soft, nontender Extremities: no edema Skin: no rashes  Additional Objective Labs: Basic Metabolic Panel: Recent Labs  Lab 04/14/18 1323 04/15/18 0643  NA 134* 136  K 4.6 4.2  CL 101 100  CO2 15* 23  GLUCOSE 76 83  BUN 109* 106*  CREATININE 8.46* 8.16*  CALCIUM 7.5* 7.7*   Liver Function Tests: Recent Labs  Lab 04/14/18 1323 04/15/18 0643  AST 50* 51*  ALT 40 41  ALKPHOS 71 84  BILITOT 1.7* 2.4*  PROT 8.1 8.6*  ALBUMIN 1.8* 2.0*   No results for input(s): LIPASE, AMYLASE in the last 168 hours. CBC: Recent Labs  Lab 04/14/18 0936 04/14/18 1323 04/15/18 0643  WBC 17.5* 15.7* 16.7*  NEUTROABS  --  13.0*  --   HGB 14.6 13.3 13.8  HCT 44.2 39.1 41.7  MCV 96.5 91.1 91.4  PLT 550* 440* 447*   Blood Culture No results found for: SDES, SPECREQUEST, CULT, REPTSTATUS  Cardiac Enzymes: Recent Labs  Lab 04/14/18 2110 04/15/18 0034 04/15/18 0643  TROPONINI <0.03 <0.03 <0.03   CBG: No results for input(s): GLUCAP in the last 168 hours. Iron Studies: No results for input(s): IRON, TIBC, TRANSFERRIN, FERRITIN in the last 72 hours. $RemoveB'@lablastinr3'lTctDtHn$ @ Studies/Results: Ct Head Wo Contrast  Result Date: 04/14/2018 CLINICAL DATA:  Elevated INR with altered level of  consciousness and confusion. EXAM: CT HEAD WITHOUT CONTRAST TECHNIQUE: Contiguous axial images were obtained from the base of the skull through the vertex without intravenous contrast. COMPARISON:  Head CT 05/10/2012 FINDINGS: Brain: Stable involutional changes of brain with chronic microvascular ischemic disease of white matter. No acute large vascular territory infarction, hemorrhage or mass. No mass effect. No hydrocephalus. Midline fourth ventricle and basal cisterns. Brainstem and cerebellum are nonacute. Vascular: Atherosclerosis of the carotid siphons. Skull: Intact without suspicious osseous lesions or fracture. Sinuses/Orbits: Intact Other: None IMPRESSION: No acute intracranial abnormality. 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 Electronically Signed   By: Ashley Royalty M.D.   On: 04/14/2018 16:48   Ct Renal Stone Study  Result Date: 04/14/2018 CLINICAL DATA:  Acute renal failure EXAM: CT ABDOMEN AND PELVIS WITHOUT CONTRAST TECHNIQUE: Multidetector CT imaging of the abdomen and pelvis was performed following the standard protocol without IV contrast. COMPARISON:  02/23/2014 FINDINGS: Lower chest: Calcified pleural plaque is identified at each lung base consistent prior asbestos exposure. Coronary arteriosclerosis of the included heart is noted without pericardial effusion or thickening. Hepatobiliary: Calcified granuloma in the right hepatic lobe. The unenhanced liver is unremarkable given limitations of a noncontrast study. No biliary dilatation is noted. The gallbladder is normal in appearance. Pancreas: Normal Spleen: Normal Adrenals/Urinary Tract: Normal  bilateral adrenal glands. Nonspecific mild perinephric fat stranding with renovascular calcifications bilaterally. No obstructive uropathy, renal mass or nephrolithiasis. Stable 14 mm cystic focus off the lower pole the left kidney with associated mild renal cortical scarring. Stomach/Bowel: Stomach is within normal limits. Small  hiatal hernia. Appendix appears normal. No evidence of bowel wall thickening, distention, or inflammatory changes. Scattered colonic diverticulosis without acute diverticulitis is identified. Vascular/Lymphatic: Aortoiliac atherosclerosis. No adenopathy by CT size criteria. Reproductive: Unremarkable prostate and seminal vesicles. Other: No free air or free fluid. Musculoskeletal: No acute or significant osseous findings. IMPRESSION: 1. Calcified pleural plaque at each lung base consistent with prior asbestos exposure. 2. Stable 14 mm cystic focus off the lower pole the left kidney with associated renal cortical scarring. No nephrolithiasis nor obstructive uropathy. No CT findings to explain the patient's renal failure. 3. Colonic diverticulosis without acute diverticulitis. Aortic Atherosclerosis (ICD10-I70.0). Electronically Signed   By: Tollie Eth M.D.   On: 04/14/2018 16:59   Medications: .  sodium bicarbonate (isotonic) infusion in sterile water 75 mL/hr at 04/15/18 0013   . diltiazem  60 mg Oral BID  . folic acid  1 mg Oral Daily  . levothyroxine  50 mcg Oral Q0600  . LORazepam  0-4 mg Intravenous Q6H   Followed by  . [START ON 04/16/2018] LORazepam  0-4 mg Intravenous Q12H  . metoprolol tartrate  50 mg Oral BID  . multivitamin with minerals  1 tablet Oral Daily  . pantoprazole  40 mg Oral Daily  . sodium chloride flush  3 mL Intravenous Q12H  . thiamine  100 mg Oral Daily   Or  . thiamine  100 mg Intravenous Daily    Assessment/Plan:  Presumed acute kidney injury with no evidence of hydronephrosis preceding history of weakness and fatigue for 2 months.  We have no data points except for 12 months ago when renal function was normal.  We are awaiting results of urinalysis and microscopy.  The differential at this point appears to point towards volume depletion and possible acute tubular necrosis.  Hepatorenal syndrome unlikely as he reports no h/o EtOH heavy use and has normal urine  sodium.  R/o paraproteinemia with SPEP and UPEP.  He has good urine output and the urine in the room appeared clear nonbloody to the naked eye.  This may indeed be chronic renal insufficiency from an undefined illness.  We will continue to support patient with IV hydration at this point and follow his urine output and renal function closely.  There are no indications for dialysis at this time and he says he would need to give it thought if this is a therapy he would accept should need arise.   Hypotension.  Appears to be volume depletion with history of atrial fibrillation rapid ventricular rate will start provide support to patient with IV hydration.  Metabolic acidosis.  Bicarb improved to 23 this AM, switch to NS at 75/hr and oral bicarb 650 TID.  Anemia no issue at this time  Bones will check PTH this may be helpful to evaluate the difference between chronic and acute injury  Atrial fibrillation rapid ventricular rate appears to be supratherapeutic.  Would recommend troponin 2D echo and TSH.  Supratherapeutic INR secondary to exogenous use of Coumadin  Elevated liver enzymes.  These seem to have resolved.  I suspect chronic alcohol use has been the etiology.  Avoid nephrotoxins ACE inhibitors ARB use nonsteroidal anti-inflammatory drugs Cox 2 inhibitors and IV contrast.  Estill Bakes MD  04/15/2018, 8:16 AM  [facility] Pager: [phone]

## 2018-04-15 NOTE — Evaluation (Signed)
Physical Therapy Evaluation & Discharge Patient Details Name: Daniel Morse MRN: [id number] DOB: 13-Jul-1940 Today's Date: 04/15/2018   History of Present Illness  Pt is a 78 y.o. male admitted 04/14/18 with elevated INR. Worked up for acute kidney failure and hepatic steatosis. Also tachycardia and afib. PMH includes afib (on chronic coumadin), HTN, HLD, GERD.    Clinical Impression  Patient evaluated by Physical Therapy with no further acute PT needs identified. PTA, pt indep and lives with wife. Today, pt indep with mobility; Dynamic Gait Index score of 20/24 indicates pt not at high risk for falls with higher level balance activities. HR up to 140s with ambulation; SpO2 99% on RA. All education has been completed and the patient has no further questions. Encouraged continued ambulation during hospital admission. Acute PT is signing off. Thank you for this referral.    Follow Up Recommendations No PT follow up;Supervision - Intermittent    Equipment Recommendations  None recommended by PT    Recommendations for Other Services       Precautions / Restrictions Precautions Precautions: Other (comment) Precaution Comments: Poor vision (blind L eye) Restrictions Weight Bearing Restrictions: No      Mobility  Bed Mobility Overal bed mobility: Independent                Transfers Overall transfer level: Independent                  Ambulation/Gait Ambulation/Gait assistance: Independent Gait Distance (Feet): 450 Feet Assistive device: None Gait Pattern/deviations: Step-through pattern;Decreased stride length Gait velocity: Decreased Gait velocity interpretation: 1.31 - 2.62 ft/sec, indicative of limited community ambulator General Gait Details: Slow, mostly steady gait indep without overt LOB  Stairs Stairs: (Pt declined)          Wheelchair Mobility    Modified Rankin (Stroke Patients Only)       Balance Overall balance assessment: Needs  assistance   Sitting balance-Leahy Scale: Good       Standing balance-Leahy Scale: Good                   Standardized Balance Assessment Standardized Balance Assessment : Dynamic Gait Index   Dynamic Gait Index Level Surface: Normal Change in Gait Speed: Mild Impairment Gait with Horizontal Head Turns: Normal Gait with Vertical Head Turns: Normal Gait and Pivot Turn: Normal Step Over Obstacle: Mild Impairment Step Around Obstacles: Mild Impairment Steps: Mild Impairment Total Score: 20       Pertinent Vitals/Pain Pain Assessment: No/denies pain    Home Living Family/patient expects to be discharged to:: Private residence Living Arrangements: Spouse/significant other Available Help at Discharge: Family;Available 24 hours/day Type of Home: House Home Access: Stairs to enter Entrance Stairs-Rails: Right Entrance Stairs-Number of Steps: 3 Home Layout: One level Home Equipment: None      Prior Function Level of Independence: Independent               Hand Dominance        Extremity/Trunk Assessment   Upper Extremity Assessment Upper Extremity Assessment: Overall WFL for tasks assessed    Lower Extremity Assessment Lower Extremity Assessment: Overall WFL for tasks assessed    Cervical / Trunk Assessment Cervical / Trunk Assessment: Normal  Communication   Communication: No difficulties  Cognition Arousal/Alertness: Awake/alert Behavior During Therapy: WFL for tasks assessed/performed Overall Cognitive Status: Within Functional Limits for tasks assessed  General Comments      Exercises     Assessment/Plan    PT Assessment Patent does not need any further PT services  PT Problem List         PT Treatment Interventions      PT Goals (Current goals can be found in the Care Plan section)  Acute Rehab PT Goals PT Goal Formulation: All assessment and education complete, DC  therapy    Frequency     Barriers to discharge        Co-evaluation               AM-PAC PT "6 Clicks" Mobility  Outcome Measure Help needed turning from your back to your side while in a flat bed without using bedrails?: None Help needed moving from lying on your back to sitting on the side of a flat bed without using bedrails?: None Help needed moving to and from a bed to a chair (including a wheelchair)?: None Help needed standing up from a chair using your arms (e.g., wheelchair or bedside chair)?: None Help needed to walk in hospital room?: None Help needed climbing 3-5 steps with a railing? : A Little 6 Click Score: 23    End of Session Equipment Utilized During Treatment: Gait belt Activity Tolerance: Patient tolerated treatment well Patient left: in bed;with call bell/phone within reach Nurse Communication: Mobility status PT Visit Diagnosis: Other abnormalities of gait and mobility (R26.89)    Time: 1308-6578 PT Time Calculation (min) (ACUTE ONLY): 10 min   Charges:   PT Evaluation $PT Eval Moderate Complexity: 1 Mod        Mabeline Caras, PT, DPT Acute Rehabilitation Services  Pager [phone] Office [place] 04/15/2018, 9:06 AM

## 2018-04-15 NOTE — Progress Notes (Signed)
OT Cancellation Note  Patient Details Name: Daniel Morse MRN: [id number] DOB: 11-26-1940   Cancelled Treatment:    Reason Eval/Treat Not Completed: OT screened, no needs identified, will sign off   Per discussion with PT and RN, pt does not require OT skilled services as pt is independent at his baseline. No OT needs identified. OT sign off.  Ebony Hail Harold Hedge) Marsa Aris OTR/L Acute Rehabilitation Services Pager: [phone] Office: [phone]   Fredda Hammed 04/15/2018, 9:52 AM

## 2018-04-15 NOTE — Progress Notes (Signed)
Patient was able to get some sleep throughout the night he reported feeling better this morning. He had one occurrence of unmeasured urine, patient is educated about the importance of voiding in the urinal so we can keep a track of how much he is voiding while in the hospital.

## 2018-04-15 NOTE — Clinical Social Work Note (Signed)
CSW acknowledges consult. Spoke with RN.  Dayton Scrape, [place]

## 2018-04-15 NOTE — Progress Notes (Signed)
[facility] Progress Note  Patient: Daniel Morse CNO:[id number]   PCP: Sharilyn Sites, MD DOB: [birth year]   DOA: 04/14/2018   DOS: 04/15/2018   Date of Service: the patient was seen and examined on 04/15/2018  Brief hospital course: Pt. with PMH of chronic A. fib, HTN, HLD, GERD, alcohol abuse; admitted on 04/14/2018, presented with complaint of shortness of breath, was found to have acute kidney injury. Currently further plan is supportive measures.  Subjective: No acute complaint.  Has chronic bilateral eye redness.  No nausea no vomiting no fever no chills.  No dizziness lightheadedness.  Assessment and Plan: 1.  Acute kidney failure Etiology is unclear however the patient has had some recent testing with a CT scan back in January.   He did receive IV dye at that point but his renal function was normal.  Continue aggressive hydration. Nephrology consulted. We will follow-up. Currently no indication for dialysis.  2.  Hepatic steatosis:  Etiology is unclear may be related to prior heavy alcohol drinking.   Patient states he has quit drinking.   However his INR is markedly elevated and he has had recent episode of what appeared to be some hepatitis that may have been alcoholic hepatitis. Unsure as the etiology.   Patient was scheduled for an outpatient liver biopsy but currently we will hold off on that.  3.  Chronic A. fib with RVR. On Cardizem and Lopressor at home. It was continued at home regimen but his heart rate remained elevated with hypotension. Started on IV Cardizem drip. Transfer to stepdown unit. Continue to monitor. Continue Lopressor. Echocardiogram ordered. TSH normal. Do not suspect that the patient actually has ACS. Anticoagulation with Coumadin.  Pharmacy consulted.  INR subtherapeutic.  4.  Hypertension:  Pressures are soft.   Continue aggressive IV hydration.  5.  Gastroesophageal reflux disease: Continue proton pump inhibitor.  6.   Hyperlipidemia:  Continue present care.  7.  Alcohol abuse in remission:  Reportedly has a history of being a volatile and possibly heavy drinker. Continue CIWA protocol.  8. Body mass index is 32.01 kg/m.  Nutrition Problem: Moderate Malnutrition Etiology: chronic illness(hepatic steatosis) Interventions: Interventions: Ensure Enlive (each supplement provides 350kcal and 20 grams of protein), MVI  Diet: cardiac diet DVT Prophylaxis: on therapeutic anticoagulation.  Advance goals of care discussion: full code  Family Communication: no family was present at bedside, at the time of interview. Called family per request Opportunity was given to ask question and all questions were answered satisfactorily.   Disposition:  Discharge to home.  Consultants: none Procedures: Echocardiogram   Scheduled Meds: . feeding supplement (ENSURE ENLIVE)  237 mL Oral BID BM  . folic acid  1 mg Oral Daily  . levothyroxine  50 mcg Oral Q0600  . LORazepam  0-4 mg Intravenous Q6H   Followed by  . [START ON 04/16/2018] LORazepam  0-4 mg Intravenous Q12H  . metoprolol tartrate  50 mg Oral BID  . multivitamin with minerals  1 tablet Oral Daily  . pantoprazole  40 mg Oral Daily  . sodium bicarbonate  650 mg Oral TID  . sodium chloride flush  3 mL Intravenous Q12H  . thiamine  100 mg Oral Daily   Or  . thiamine  100 mg Intravenous Daily  . Warfarin - Pharmacist Dosing Inpatient   Does not apply q1800   Continuous Infusions: . sodium chloride 75 mL/hr at 04/15/18 1814  . diltiazem (CARDIZEM) infusion 7.5 mg/hr (04/15/18 1709)  . sodium  chloride 983.6 mL/hr at 04/15/18 1511   PRN Meds: LORazepam **OR** LORazepam, ondansetron **OR** ondansetron (ZOFRAN) IV, oxyCODONE, traZODone Antibiotics: Anti-infectives (From admission, onward)   None       Objective: Physical Exam: Vitals:   04/15/18 1647 04/15/18 1657 04/15/18 1739 04/15/18 1814  BP: 109/74 115/77 114/75 100/74  Pulse: (!) 116 (!)  101 (!) 121 (!) 144  Resp:   19   Temp:   98.4 F (36.9 C)   TempSrc:   Oral   SpO2: 97% 93% 98% 96%  Weight:      Height:        Intake/Output Summary (Last 24 hours) at 04/15/2018 1852 Last data filed at 04/15/2018 1511 Gross per 24 hour  Intake 1435.63 ml  Output 800 ml  Net 635.63 ml   Filed Weights   04/14/18 2016 04/15/18 0500  Weight: 90.3 kg 89.9 kg   General: Alert, Awake and Oriented to Time, Place and Person. Appear in mild distress, affect appropriate Eyes: PERRL, Conjunctival redness ENT: Oral Mucosa clear dry. Neck: no JVD, no Abnormal Mass Or lumps Cardiovascular: S1 and S2 Present, aortic systolic  Murmur, Peripheral Pulses Present Respiratory: normal respiratory effort, Bilateral Air entry equal and Decreased, no use of accessory muscle, Clear to Auscultation, no Crackles, no wheezes Abdomen: Bowel Sound present, Soft and no tenderness, no hernia Skin: no redness, no Rash, no induration Extremities: no Pedal edema, no calf tenderness Neurologic: Grossly no focal neuro deficit. Bilaterally Equal motor strength  Data Reviewed: CBC: Recent Labs  Lab 04/14/18 0936 04/14/18 1323 04/15/18 0643  WBC 17.5* 15.7* 16.7*  NEUTROABS  --  13.0*  --   HGB 14.6 13.3 13.8  HCT 44.2 39.1 41.7  MCV 96.5 91.1 91.4  PLT 550* 440* 174*   Basic Metabolic Panel: Recent Labs  Lab 04/14/18 1323 04/15/18 0643  NA 134* 136  K 4.6 4.2  CL 101 100  CO2 15* 23  GLUCOSE 76 83  BUN 109* 106*  CREATININE 8.46* 8.16*  CALCIUM 7.5* 7.7*    Liver Function Tests: Recent Labs  Lab 04/14/18 1323 04/15/18 0643  AST 50* 51*  ALT 40 41  ALKPHOS 71 84  BILITOT 1.7* 2.4*  PROT 8.1 8.6*  ALBUMIN 1.8* 2.0*   No results for input(s): LIPASE, AMYLASE in the last 168 hours. Recent Labs  Lab 04/14/18 1323  AMMONIA 12   Coagulation Profile: Recent Labs  Lab 04/14/18 1023 04/14/18 1323 04/15/18 0643  INR 9.01* 9.16* 1.84   Cardiac Enzymes: Recent Labs  Lab  04/14/18 2110 04/15/18 0034 04/15/18 0643  TROPONINI <0.03 <0.03 <0.03   BNP (last 3 results) No results for input(s): PROBNP in the last 8760 hours. CBG: No results for input(s): GLUCAP in the last 168 hours. Studies: No results found.   Time spent: 35 minutes  Author: Berle Mull, MD [facility] 04/15/2018 6:52 PM  To reach On-call, see care teams to locate the attending and reach out to them via [email or web address]. If 7PM-7AM, please contact night-coverage If you still have difficulty reaching the attending provider, please page the [facility] (Director on Call) for [facility] on amion for assistance.

## 2018-04-15 NOTE — Progress Notes (Signed)
ANTICOAGULATION CONSULT NOTE - Initial Consult  Pharmacy Consult for warfarin Indication: atrial fibrillation  Allergies  Allergen Reactions  . Statins Swelling    Legs hurt    Patient Measurements: Height: 5\' 6"  (167.6 cm) Weight: 198 lb 4.8 oz (89.9 kg) IBW/kg (Calculated) : 63.8  Vital Signs: Temp: 98.4 F (36.9 C) (02/12 1144) Temp Source: Oral (02/12 1144) BP: 108/78 (02/12 1144) Pulse Rate: 95 (02/12 1144)  Labs: Recent Labs    04/14/18 0936 04/14/18 1023 04/14/18 1323 04/14/18 2110 04/15/18 0034 04/15/18 0643  HGB 14.6  --  13.3  --   --  13.8  HCT 44.2  --  39.1  --   --  41.7  PLT 550*  --  440*  --   --  447*  APTT  --  78* 79*  --   --   --   LABPROT  --  72.0* 72.9*  --   --  21.0*  INR  --  9.01* 9.16*  --   --  1.84  CREATININE  --   --  8.46*  --   --  8.16*  TROPONINI  --   --   --  <0.03 <0.03 <0.03    Estimated Creatinine Clearance: 8.2 mL/min (A) (by C-G formula based on SCr of 8.16 mg/dL (H)).   Medical History: Past Medical History:  Diagnosis Date  . Atrial flutter ([place]) 06/2010  . Cerebrovascular disease 2008   Left CEA  . Chest pain    negative stress nuclear in 11/2002  . Chronic anticoagulation   . Dysrhythmia   . GERD (gastroesophageal reflux disease)    h/o esophagitis  . History of colonic polyps    Colonoscopy and polypectomy in 03/2011  . History of kidney stones   . Hyperlipidemia    Metabolic syndrome with increased triglycerides  . Hypertension   . Macular degeneration    legally blind  . Nephrolithiasis   . Peripheral vascular disease ([place])   . Tobacco abuse    50 pack years; 1/2 pack per day    Medications:  Scheduled:  . diltiazem  10 mg Intravenous Once  . feeding supplement (ENSURE ENLIVE)  237 mL Oral BID BM  . folic acid  1 mg Oral Daily  . levothyroxine  50 mcg Oral Q0600  . LORazepam  0-4 mg Intravenous Q6H   Followed by  . [START ON 04/16/2018] LORazepam  0-4 mg Intravenous Q12H  . metoprolol  tartrate  50 mg Oral BID  . multivitamin with minerals  1 tablet Oral Daily  . pantoprazole  40 mg Oral Daily  . sodium bicarbonate  650 mg Oral TID  . sodium chloride flush  3 mL Intravenous Q12H  . thiamine  100 mg Oral Daily   Or  . thiamine  100 mg Intravenous Daily    Assessment: [name] on warfarin PTA for atrial fibrillation. Dose PTA 3.75 mg on Tues, Thurs, and Saturdays; 2.5 mg on all other days of the week. Was instructed by Dr. Laural Golden to hold warfarin on 2/5 with elevated LFTs, weakness, and poor appetite. INR on admission was elevated at 9.01 despite holding warfarin. S/p vitamin K 5 mg IV x 1 on 2/11. INR today is 1.84. CBC stable. LFTs improved, but are still slightly elevated.  Goal of Therapy:  INR 2-3 Monitor platelets by anticoagulation protocol: Yes   Plan:  Warfarin 2.5 mg x 1 today Daily INR and CBC Monitor for bleeding  Vertis Kelch,  PharmD PGY1 Pharmacy Resident Phone [phone] 04/15/2018       3:47 PM

## 2018-04-15 NOTE — Progress Notes (Signed)
Initial Nutrition Assessment  DOCUMENTATION CODES:   Non-severe (moderate) malnutrition in context of chronic illness  INTERVENTION:   -Ensure Enlive po BID, each supplement provides 350 kcal and 20 grams of protein -MVI with minerals daily  NUTRITION DIAGNOSIS:   Moderate Malnutrition related to chronic illness(hepatic steatosis) as evidenced by energy intake < or equal to 75% for > or equal to 1 month, mild fat depletion, moderate fat depletion, mild muscle depletion, moderate muscle depletion.  GOAL:   Patient will meet greater than or equal to 90% of their needs  MONITOR:   PO intake, Supplement acceptance, Labs, Weight trends, Skin, I & O's  REASON FOR ASSESSMENT:   Malnutrition Screening Tool, Consult Assessment of nutrition requirement/status  ASSESSMENT:   Daniel Morse is a 78 y.o. male with medical history significant of atrial fibrillation on chronic Coumadin therapy, hypertension, hyperlipidemia, gastroesophageal reflux disease, kidney stones who scheduled to have a liver biopsy today to further evaluate weight loss, decreased oral intake and an ultrasound showing hepatic steatosis.  On presentation for biopsy patient was found to have an INR of 9 and was sent to the emergency department for evaluation.  He denies any bleeding he has had no chest pain or palpitations no shortness of breath and does report some fatigue.  On further evaluation his creatinine was found to be in the 8-1/2 range and he was referred to me for further evaluation and management  Pt admitted with acute kidney failure.   Reviewed I/O's: +377 ml x 24 hours  Case discussed with RN, who reports pt is eating well, however, is very sensitive regarding ETOH use. Per RN, pt revealed last drink was one year ago.   Spoke with pt at bedside, who reports very poor oral intake over the past 2 weeks. He reports that pt only consumed either some toast or a few crackers daily over the past 2 weeks. Pt  shares he usually has a good appetite; he consumes 3 meals per day (Breakfast: eggs, grits, toast, and sausage; Lunch: sandwich; Dinner: meat, starch, and vegetable). Pt shares he has also experienced progressive weakness over the past month, sleeping over 13 hours per day.   Per pt, his UBW is around 220#. He estimates he has lost about 25# within the past month. Reviewed wt hx; noted pt has experienced a 13.8% wt loss over the past year, which while not significant for time frame, is concerning given poor po's, malnutrition, and multiple co-morbidities.   Discussed importance of good meal and supplement intake to promote healing. Pt amenable to Ensure supplements, as he was consuming 1-2 supplements a few days PTA due to decreased appetite.   Labs reviewed.   NUTRITION - FOCUSED PHYSICAL EXAM:    Most Recent Value  Orbital Region  Mild depletion  Upper Arm Region  Moderate depletion  Thoracic and Lumbar Region  No depletion  Buccal Region  No depletion  Temple Region  Mild depletion  Clavicle Bone Region  Mild depletion  Clavicle and Acromion Bone Region  No depletion  Scapular Bone Region  No depletion  Dorsal Hand  Mild depletion  Patellar Region  Moderate depletion  Anterior Thigh Region  Moderate depletion  Posterior Calf Region  Moderate depletion  Edema (RD Assessment)  None  Hair  Reviewed  Eyes  Reviewed  Mouth  Reviewed  Skin  Reviewed  Nails  Reviewed       Diet Order:   Diet Order  Diet Heart Room service appropriate? Yes; Fluid consistency: Thin  Diet effective now              EDUCATION NEEDS:   Education needs have been addressed  Skin:  Skin Assessment: Reviewed RN Assessment  Last BM:  04/13/18  Height:   Ht Readings from Last 1 Encounters:  04/14/18 $RemoveB'5\' 6"'uEXbzXcv$  (1.676 m)    Weight:   Wt Readings from Last 1 Encounters:  04/15/18 89.9 kg    Ideal Body Weight:  64.5 kg  BMI:  Body mass index is 32.01 kg/m.  Estimated Nutritional  Needs:   Kcal:  1900-2100  Protein:  95-110 grams  Fluid:  1.9-2.1 L    Indya Oliveria A. Jimmye Norman, RD, LDN, CDE Pager: [phone] After hours Pager: [phone]

## 2018-04-15 NOTE — Progress Notes (Signed)
Went in pt room when bed alarm went off. Pt states "I dont need any help please turn this bed alarm thing off". Nurse notified.

## 2018-04-16 ENCOUNTER — Inpatient Hospital Stay (HOSPITAL_COMMUNITY): Payer: Medicare HMO

## 2018-04-16 DIAGNOSIS — I361 Nonrheumatic tricuspid (valve) insufficiency: Secondary | ICD-10-CM

## 2018-04-16 LAB — RENAL FUNCTION PANEL
ALBUMIN: 1.7 g/dL — AB (ref 3.5–5.0)
Anion gap: 14 (ref 5–15)
BUN: 92 mg/dL — AB (ref 8–23)
CO2: 19 mmol/L — ABNORMAL LOW (ref 22–32)
Calcium: 7.2 mg/dL — ABNORMAL LOW (ref 8.9–10.3)
Chloride: 102 mmol/L (ref 98–111)
Creatinine, Ser: 6.98 mg/dL — ABNORMAL HIGH (ref 0.61–1.24)
GFR calc Af Amer: 8 mL/min — ABNORMAL LOW (ref >60)
GFR calc non Af Amer: 7 mL/min — ABNORMAL LOW (ref >60)
Glucose, Bld: 98 mg/dL (ref 70–99)
Phosphorus: 5.2 mg/dL — ABNORMAL HIGH (ref 2.5–4.6)
Potassium: 3.6 mmol/L (ref 3.5–5.1)
Sodium: 135 mmol/L (ref 135–145)

## 2018-04-16 LAB — PROTEIN / CREATININE RATIO, URINE
CREATININE, URINE: 51.14 mg/dL
Creatinine, Urine: 53.18 mg/dL
Protein Creatinine Ratio: 0.28 mg/mg{Cre} — ABNORMAL HIGH (ref 0.00–0.15)
Protein Creatinine Ratio: 0.25 mg/mg{Cre} — ABNORMAL HIGH (ref 0.00–0.15)
Total Protein, Urine: 15 mg/dL
Total Protein, Urine: 13 mg/dL

## 2018-04-16 LAB — PARATHYROID HORMONE, INTACT (NO CA): PTH: 133 pg/mL — ABNORMAL HIGH (ref 15–65)

## 2018-04-16 LAB — CBC
HCT: 36 % — ABNORMAL LOW (ref 39.0–52.0)
Hemoglobin: 12.4 g/dL — ABNORMAL LOW (ref 13.0–17.0)
MCH: 31.4 pg (ref 26.0–34.0)
MCHC: 34.4 g/dL (ref 30.0–36.0)
MCV: 91.1 fL (ref 80.0–100.0)
Platelets: 367 10*3/uL (ref 150–400)
RBC: 3.95 MIL/uL — ABNORMAL LOW (ref 4.22–5.81)
RDW: 13.6 % (ref 11.5–15.5)
WBC: 19.2 10*3/uL — ABNORMAL HIGH (ref 4.0–10.5)
nRBC: 0 % (ref 0.0–0.2)

## 2018-04-16 LAB — ECHOCARDIOGRAM COMPLETE
Height: 66 in
Weight: 3172.8 oz

## 2018-04-16 LAB — PROTIME-INR
INR: 1.6
Prothrombin Time: 18.9 seconds — ABNORMAL HIGH (ref 11.4–15.2)

## 2018-04-16 LAB — MAGNESIUM: Magnesium: 1.7 mg/dL (ref 1.7–2.4)

## 2018-04-16 MED ORDER — MAGNESIUM SULFATE 2 GM/50ML IV SOLN
2.0000 g | Freq: Once | INTRAVENOUS | Status: AC
  Administered 2018-04-16: 2 g via INTRAVENOUS
  Filled 2018-04-16: qty 50

## 2018-04-16 MED ORDER — SODIUM CHLORIDE 0.9 % IV SOLN
INTRAVENOUS | Status: DC
  Administered 2018-04-16 – 2018-04-17 (×4): via INTRAVENOUS

## 2018-04-16 MED ORDER — WARFARIN SODIUM 2.5 MG PO TABS
3.7500 mg | ORAL_TABLET | Freq: Once | ORAL | Status: AC
  Administered 2018-04-16: 3.75 mg via ORAL
  Filled 2018-04-16: qty 1

## 2018-04-16 MED ORDER — SODIUM CHLORIDE 0.9 % IV BOLUS
500.0000 mL | Freq: Once | INTRAVENOUS | Status: AC
  Administered 2018-04-16: 500 mL via INTRAVENOUS

## 2018-04-16 MED ORDER — POTASSIUM CHLORIDE CRYS ER 20 MEQ PO TBCR
20.0000 meq | EXTENDED_RELEASE_TABLET | Freq: Once | ORAL | Status: AC
  Administered 2018-04-16: 20 meq via ORAL
  Filled 2018-04-16: qty 1

## 2018-04-16 NOTE — Progress Notes (Signed)
[place] for warfarin Indication: atrial fibrillation  Allergies  Allergen Reactions  . Statins Swelling    Legs hurt   Labs: Recent Labs    04/14/18 1023 04/14/18 1323 04/14/18 2110 04/15/18 0034 04/15/18 0643 04/16/18 0306  HGB  --  13.3  --   --  13.8 12.4*  HCT  --  39.1  --   --  41.7 36.0*  PLT  --  440*  --   --  447* 367  APTT 78* 79*  --   --   --   --   LABPROT 72.0* 72.9*  --   --  21.0* 18.9*  INR 9.01* 9.16*  --   --  1.84 1.60  CREATININE  --  8.46*  --   --  8.16* 6.98*  TROPONINI  --   --  <0.03 <0.03 <0.03  --     Estimated Creatinine Clearance: 9.6 mL/min (A) (by C-G formula based on SCr of 6.98 mg/dL (H)).   Assessment: [name] on warfarin PTA for atrial fibrillation. Dose PTA 3.75 mg on Tues, Thurs, and Saturdays; 2.5 mg on all other days of the week. Was instructed by Dr. Laural Golden to hold warfarin on 2/5 with elevated LFTs, weakness, and poor appetite.  INR on admission was elevated at 9.01 despite holding warfarin. S/p vitamin K 5 mg IV x 1 on 2/11.   INR = 1.6  Goal of Therapy:  INR 2-3 Monitor platelets by anticoagulation protocol: Yes   Plan:  Warfarin 3.75 mg po x 1 dose at 1800 pm Daily INR and CBC Monitor for bleeding  Thank you Anette Guarneri, PharmD [phone] 04/16/2018       9:55 AM

## 2018-04-16 NOTE — Progress Notes (Signed)
[facility] Progress Note   Subjective:   Feeling generally improved today.  No new complaints.   Objective Vitals:   04/16/18 0000 04/16/18 0216 04/16/18 0300 04/16/18 0400  BP: 106/73 94/71 110/73 109/75  Pulse: (!) 51 (!) 106 100 (!) 114  Resp:   18   Temp:   98.1 F (36.7 C)   TempSrc:   Oral   SpO2: 96% 95% 92% 95%  Weight:      Height:       Physical Exam General: elderly man lying flat in bed in no distress Eyes: conjunctiva injected BL, L eye blind ENT: MMM Heart: tachy, irregular, no rub Lungs: normal WOB Abdomen: soft, nontender Extremities: no edema Skin: no rashes  Additional Objective Labs: Basic Metabolic Panel: Recent Labs  Lab 04/14/18 1323 04/15/18 0643 04/16/18 0306  NA 134* 136 135  K 4.6 4.2 3.6  CL 101 100 102  CO2 15* 23 19*  GLUCOSE 76 83 98  BUN 109* 106* 92*  CREATININE 8.46* 8.16* 6.98*  CALCIUM 7.5* 7.7* 7.2*  PHOS  --   --  5.2*   Liver Function Tests: Recent Labs  Lab 04/14/18 1323 04/15/18 0643 04/16/18 0306  AST 50* 51*  --   ALT 40 41  --   ALKPHOS 71 84  --   BILITOT 1.7* 2.4*  --   PROT 8.1 8.6*  --   ALBUMIN 1.8* 2.0* 1.7*   No results for input(s): LIPASE, AMYLASE in the last 168 hours. CBC: Recent Labs  Lab 04/14/18 0936 04/14/18 1323 04/15/18 0643 04/16/18 0306  WBC 17.5* 15.7* 16.7* 19.2*  NEUTROABS  --  13.0*  --   --   HGB 14.6 13.3 13.8 12.4*  HCT 44.2 39.1 41.7 36.0*  MCV 96.5 91.1 91.4 91.1  PLT 550* 440* 447* 367   Blood Culture    Component Value Date/Time   SDES URINE, RANDOM 04/14/2018 1548   SPECREQUEST NONE 04/14/2018 1548   CULT (A) 04/14/2018 1548    <10,000 COLONIES/mL INSIGNIFICANT GROWTH Performed at [facility] [address] [place], [place] [address]    REPTSTATUS 04/15/2018 FINAL 04/14/2018 1548    Cardiac Enzymes: Recent Labs  Lab 04/14/18 2110 04/15/18 0034 04/15/18 0643  TROPONINI <0.03 <0.03 <0.03   CBG: No results for input(s): GLUCAP  in the last 168 hours. Iron Studies: No results for input(s): IRON, TIBC, TRANSFERRIN, FERRITIN in the last 72 hours. $RemoveB'@lablastinr3'RmUAVJeK$ @ Studies/Results: Ct Head Wo Contrast  Result Date: 04/14/2018 CLINICAL DATA:  Elevated INR with altered level of consciousness and confusion. EXAM: CT HEAD WITHOUT CONTRAST TECHNIQUE: Contiguous axial images were obtained from the base of the skull through the vertex without intravenous contrast. COMPARISON:  Head CT 05/10/2012 FINDINGS: Brain: Stable involutional changes of brain with chronic microvascular ischemic disease of white matter. No acute large vascular territory infarction, hemorrhage or mass. No mass effect. No hydrocephalus. Midline fourth ventricle and basal cisterns. Brainstem and cerebellum are nonacute. Vascular: Atherosclerosis of the carotid siphons. Skull: Intact without suspicious osseous lesions or fracture. Sinuses/Orbits: Intact Other: None IMPRESSION: No acute intracranial abnormality. 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 Electronically Signed   By: Ashley Royalty M.D.   On: 04/14/2018 16:48   Ct Renal Stone Study  Result Date: 04/14/2018 CLINICAL DATA:  Acute renal failure EXAM: CT ABDOMEN AND PELVIS WITHOUT CONTRAST TECHNIQUE: Multidetector CT imaging of the abdomen and pelvis was performed following the standard protocol without IV contrast. COMPARISON:  02/23/2014 FINDINGS:  Lower chest: Calcified pleural plaque is identified at each lung base consistent prior asbestos exposure. Coronary arteriosclerosis of the included heart is noted without pericardial effusion or thickening. Hepatobiliary: Calcified granuloma in the right hepatic lobe. The unenhanced liver is unremarkable given limitations of a noncontrast study. No biliary dilatation is noted. The gallbladder is normal in appearance. Pancreas: Normal Spleen: Normal Adrenals/Urinary Tract: Normal bilateral adrenal glands. Nonspecific mild perinephric fat stranding with  renovascular calcifications bilaterally. No obstructive uropathy, renal mass or nephrolithiasis. Stable 14 mm cystic focus off the lower pole the left kidney with associated mild renal cortical scarring. Stomach/Bowel: Stomach is within normal limits. Small hiatal hernia. Appendix appears normal. No evidence of bowel wall thickening, distention, or inflammatory changes. Scattered colonic diverticulosis without acute diverticulitis is identified. Vascular/Lymphatic: Aortoiliac atherosclerosis. No adenopathy by CT size criteria. Reproductive: Unremarkable prostate and seminal vesicles. Other: No free air or free fluid. Musculoskeletal: No acute or significant osseous findings. IMPRESSION: 1. Calcified pleural plaque at each lung base consistent with prior asbestos exposure. 2. Stable 14 mm cystic focus off the lower pole the left kidney with associated renal cortical scarring. No nephrolithiasis nor obstructive uropathy. No CT findings to explain the patient's renal failure. 3. Colonic diverticulosis without acute diverticulitis. Aortic Atherosclerosis (ICD10-I70.0). Electronically Signed   By: Ashley Royalty M.D.   On: 04/14/2018 16:59   Medications: . sodium chloride 75 mL/hr at 04/16/18 0507  . diltiazem (CARDIZEM) infusion 10 mg/hr (04/16/18 0507)   . feeding supplement (ENSURE ENLIVE)  237 mL Oral BID BM  . folic acid  1 mg Oral Daily  . levothyroxine  50 mcg Oral Q0600  . LORazepam  0-4 mg Intravenous Q6H   Followed by  . LORazepam  0-4 mg Intravenous Q12H  . metoprolol tartrate  50 mg Oral BID  . multivitamin with minerals  1 tablet Oral Daily  . pantoprazole  40 mg Oral Daily  . sodium bicarbonate  650 mg Oral TID  . sodium chloride flush  3 mL Intravenous Q12H  . thiamine  100 mg Oral Daily   Or  . thiamine  100 mg Intravenous Daily  . Warfarin - Pharmacist Dosing Inpatient   Does not apply q1800    2/11 CT:  Noting - Nonspecific mild perinephric fat stranding with renovascular  calcifications bilaterally. No obstructive uropathy, renal mass or nephrolithiasis. Stable 14 mm cystic focus off the lower pole the left kidney with associated mild renal cortical scarring.  Assessment/Plan: 1.  AKI, severe, nonoliguric:  Baseline normal 03/2017.  Minor improvement in renal function today, balanced electrolytes and no indications for dialysis.   SPEP with polyclonal gammopathy, UPEP with M spike though IFE pending. UP/C 0.28.   ANA neg.  Imaging unrevealing.   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 Hematuria present. I think this is likely the culprit.  I think it's ok to resume warfarin with conservative dosing and careful monitoring.   I proposed watchful waiting for now and he's in agreement.   PTH pending which may help establish chronicity - it appears this is acute.   2.  Leukocytosis: unclear etiology. Afebrile, no localizing s/s infection.  Hb was normal on presentation, now 12.4, Plt count normal.   3.  Transaminitis: following with hepatology, probably still needs a liver biopsy.    4.  Atrial fibrillation:  Per primary.   5.  Metabolic acidosis:  Cont na bicarb 650 TID.   Jannifer Hick  MD 04/16/2018, 6:00 AM  [facility] Pager: [phone]

## 2018-04-16 NOTE — Consult Note (Signed)
Consultation Note Date: 04/16/2018   Patient Name: Daniel Morse  DOB: Apr 27, 1940  MRN: [id number]  Age / Sex: 78 y.o., male  PCP: Sharilyn Sites, MD Referring Physician: Lavina Hamman, MD  Reason for Consultation: Establishing goals of care  HPI/Patient Profile: 78 y.o. male  admitted on 04/14/2018 from home after presenting for a liver biopsy and INR found to be 9. He has a past medical history of atrial fibrillation (Coumadin), GERD, hypertension, hyperlipidemia, macular degeneration (legally blind), peripheral vascular disease, and kidney stones. During his ED course he denied bleeding, chest pain, or shortness of breath. Family also reports patient is a heavy alcohol drinker however, does not wish for [place] to disclose to patient that we are aware as he reportedly quit about 8 weeks ago. 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 Previous ultrasound of scrotum showed epididymitis. Since admission patient is followed by Nephrology, no recommendations for HD at this time. He has shown some minor improvement with kidney function, watchful waiting in regards to kidney function. Palliative Medicine team consulted for goals of care.   Clinical Assessment and Goals of Care: I have reviewed medical records including lab results, imaging, Epic notes, and MAR, received report from the bedside RN, and assessed the patient. I met at the bedside with patient  to discuss diagnosis prognosis, GOC, EOL wishes, disposition and options. He denies pain or shortness of breath. He is awake, A&O x3.   I introduced Palliative Medicine as specialized medical care for people living  with serious illness. It focuses on providing relief from the symptoms and stress of a serious illness. The goal is to improve quality of life for both the patient and the family.  We discussed a brief life review of the patient, he reports he is from [place]. He and his wife of 61 years have one daughter. 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 He once enjoyed fishing and hunting. He is of [place] faith.   As far as functional and nutritional status he reports he has been blind for many years due to his macular degeneration. He states he can see shadows and outlines.He is able to provide ADLs with some assistance from his wife. He reports his appetite is fair but does admit some days he just doesn't want to eat as much. He reports he is ambulatory with cane at times. Also states he is able to do some yard work with appropriate glasses on for bright lighting.  I approached his social history. Patient expressed he formerly smoked a pack of cigarettes per day, but did quit almost 9 years ago. He also states he recently stopped drinking about 6 months ago. When asked his usual intake he reported about 2-3 glasses of vodka tonic several times during the week and  maybe more socially. He also reported he stopped eating pork at that time also. He states he was not feeling well and God was giving him a chance to change his bad habits.   We discussed his current illness and what it means in the larger context of his on-going co-morbidities. We discussed specifically his kidney functions and overall conditions.   Natural disease trajectory and expectations at EOL were discussed.  I attempted to elicit values and goals of care important to the patient.    The difference between aggressive medical intervention and comfort care was considered in light of the patient's goals of care. Mr. Slomski expressed when it is his time to go he will be ready, but feels he has much more life left. He request to continue  treating and remains hopeful for improvement.   He reports he knows the severity of his kidney condition and previously stated he would think about his feelings towards dialysis if needed. He reports " I have thought about it and I am not interested in dialysis at all. God has be covered and will take care of me however he see's fit!" support given. I did discuss with him in the event HD was needed and he chose not to proceed that this could mean an EOL situation. He verbalized understanding and stated "oh well, so be it!"   He states he does not have any advanced directive documents however, his wife knows she would be his medical decision maker. We discussed in detail his current full code status with consideration to his current illness and co-morbidities. Mr. Ruhe verbalized he would not want to undergo CPR, ACLS, defibrillation, or intubation. I explained to him based on his expressed wishes he is stating he would like to be a DNR/DNI and allowed to pass away in a natural process without interventions. He verbalized understanding and again expressed wishes for no heroic measures. Bedside, RN in room and again I explained wishes and educated patient that the nurse would be placing a purple DNR bracelet on him to identify his wishes to medical staff. Patient verbalized understanding and also expressed he recently discussed with his wife and she is aware.  He also began stating all of his funeral expenses have been prepaid and their home is paid for, in the event his wife needs anything she would not be left in debt.   Hospice and Palliative Care services outpatient were explained and offered. Given patient is hopeful for improvement I recommended outpatient Palliative. Patient politely declined and expressed he was not interested in anyone coming into his home checking in on him. Attempted to explain they would initially come out but possible arrangements can be made for phone visits with occasional  visits. He verbalized understanding and appreciation but continued to decline services at this time.   Questions and concerns were addressed. The family was encouraged to call with questions or concerns.  PMT will continue to support holistically.   Primary Decision Maker:  PATIENT    SUMMARY OF RECOMMENDATIONS    DNR/DNI-as requested by patient  Continue to treat the treatable. He expressed he would not want any heroic measures, artificial feedings, or HD if required.   Patient verbalized understanding of his condition and severity. He is not interested in outpatient palliative. He expresses he does not want people coming in and out of his home "checking on him". Attempted to explain that Palliative would not come in daily however, he politely continued to decline. However, was  appreciative of our visit and stated if I wanted to continue to see him in the hospital he would appreciate the support while here.   PMT will continue to support and follow as needed.   Code Status/Advance Care Planning:  DNR/DNI   Symptom Management:   Per attending   Palliative Prophylaxis:   Bowel Regimen, Delirium Protocol, Frequent Pain Assessment and Oral Care  Additional Recommendations (Limitations, Scope, Preferences):  Full Scope Treatment, No Hemodialysis and No Tracheostomy  Psycho-social/Spiritual:   Desire for further Chaplaincy support:NO   Additional Recommendations: Palliative Support   Prognosis:   Guarded- in the setting of acute kidney injury, warfarin toxicity, hyperlipidemia, hypertension, GERD, a-fib, alcohol abuse, hepatic steatosis, CVA, and peripheral vascular disease.   Discharge Planning: To Be Determined      Primary Diagnoses: Present on Admission: . Warfarin toxicity . Acute kidney failure ([place]) . Hyperlipidemia . Hypertension . GERD (gastroesophageal reflux disease) . Atrial fibrillation . Alcohol abuse, in remission . Hepatic steatosis   I have  reviewed the medical record, interviewed the patient and family, and examined the patient. The following aspects are pertinent.  Past Medical History:  Diagnosis Date  . Atrial flutter ([place]) 06/2010  . Cerebrovascular disease 2008   Left CEA  . Chest pain    negative stress nuclear in 11/2002  . Chronic anticoagulation   . Dysrhythmia   . GERD (gastroesophageal reflux disease)    h/o esophagitis  . History of colonic polyps    Colonoscopy and polypectomy in 03/2011  . History of kidney stones   . Hyperlipidemia    Metabolic syndrome with increased triglycerides  . Hypertension   . Macular degeneration    legally blind  . Nephrolithiasis   . Peripheral vascular disease ([place])   . Tobacco abuse    50 pack years; 1/2 pack per day   Social History   Socioeconomic History  . Marital status: Married    Spouse name: Not on file  . Number of children: Not on file  . Years of education: Not on file  . Highest education level: Not on file  Occupational History  . Not on file  Social Needs  . Financial resource strain: Not on file  . Food insecurity:    Worry: Not on file    Inability: Not on file  . Transportation needs:    Medical: Not on file    Non-medical: Not on file  Tobacco Use  . Smoking status: Former Smoker    Packs/day: 0.50    Years: 50.00    Pack years: 25.00    Types: Cigarettes    Start date: 11/06/1978    Last attempt to quit: 11/05/2009    Years since quitting: 8.4  . Smokeless tobacco: Former Systems developer    Quit date: 06/01/2010  Substance and Sexual Activity  . Alcohol use: Yes    Alcohol/week: 20.0 standard drinks    Types: 20 Shots of liquor per week    Comment: 1/2 gallon of vodka (as much as possible) quit 8 weeks ago  . Drug use: No  . Sexual activity: Not on file  Lifestyle  . Physical activity:    Days per week: Not on file    Minutes per session: Not on file  . Stress: Not on file  Relationships  . Social connections:    Talks on phone: Not on  file    Gets together: Not on file    Attends religious service: Not on file  Active member of club or organization: Not on file    Attends meetings of clubs or organizations: Not on file    Relationship status: Not on file  Other Topics Concern  . Not on file  Social History Narrative  . Not on file   Family History  Problem Relation Age of Onset  . Heart attack Father   . Hypertension Sister   . Heart attack Brother   . Colon cancer Paternal Uncle   . Heart attack Brother    Scheduled Meds: . feeding supplement (ENSURE ENLIVE)  237 mL Oral BID BM  . folic acid  1 mg Oral Daily  . levothyroxine  50 mcg Oral Q0600  . LORazepam  0-4 mg Intravenous Q6H   Followed by  . LORazepam  0-4 mg Intravenous Q12H  . metoprolol tartrate  50 mg Oral BID  . multivitamin with minerals  1 tablet Oral Daily  . pantoprazole  40 mg Oral Daily  . sodium bicarbonate  650 mg Oral TID  . sodium chloride flush  3 mL Intravenous Q12H  . thiamine  100 mg Oral Daily  . warfarin  3.75 mg Oral ONCE-1800  . Warfarin - Pharmacist Dosing Inpatient   Does not apply q1800   Continuous Infusions: . sodium chloride 75 mL/hr at 04/16/18 0847  . diltiazem (CARDIZEM) infusion 15 mg/hr (04/16/18 1106)   PRN Meds:.LORazepam **OR** LORazepam, ondansetron **OR** ondansetron (ZOFRAN) IV, oxyCODONE, traZODone Medications Prior to Admission:  Prior to Admission medications   Medication Sig Start Date End Date Taking? Authorizing Provider  diltiazem (CARDIZEM) 60 MG tablet Take 1 tablet (60 mg total) by mouth 2 (two) times daily. 11/06/17  Yes Branch, Alphonse Guild, MD  levothyroxine (SYNTHROID, LEVOTHROID) 50 MCG tablet Take 50 mcg by mouth every morning. 03/19/17  Yes [provider]  metoprolol tartrate (LOPRESSOR) 50 MG tablet Take 1 tablet (50 mg total) by mouth 2 (two) times daily. 01/12/18  Yes Branch, Alphonse Guild, MD  pantoprazole (PROTONIX) 40 MG tablet TAKE 1 TABLET BY MOUTH ONCE DAILY Patient taking  differently: Take 40 mg by mouth.  01/12/18  Yes [name], Mechele Dawley, MD  Specialty Vitamins Products (ICAPS LUTEIN-ZEAXANTHIN PO) Take 1 tablet by mouth 2 (two) times daily.    Yes [provider]  warfarin (COUMADIN) 2.5 MG tablet Take 1 tablet daily except 1 1/2 tablets on Tuesdays, Thursdays and Saturdays Patient taking differently: Take 2.5-3.75 mg by mouth See admin instructions. Take 1 tablet daily except 1 1/2 tablets on Tuesdays, Thursdays and Saturdays 02/04/17  Yes Branch, Alphonse Guild, MD   Allergies  Allergen Reactions  . Statins Swelling    Legs hurt   Review of Systems  Constitutional: Positive for fatigue.  Neurological: Positive for weakness.  All other systems reviewed and are negative.  Physical Exam: A&O x3, tachy, irregular, no distress, abdomen soft, nontender, positive x4 quads, no edema, bilateral eye blindness, bilateral cornea redness   Vital Signs: BP 97/72 (BP Location: Left Arm)   Pulse (!) 104   Temp 98.1 F (36.7 C) (Oral)   Resp 18   Ht '5\' 6"'$  (1.676 m)   Wt 89.9 kg   SpO2 100%   BMI 32.01 kg/m  Pain Scale: 0-10   Pain Score: 0-No pain   SpO2: SpO2: 100 % O2 Device:SpO2: 100 % O2 Flow Rate: .   IO: Intake/output summary:   Intake/Output Summary (Last 24 hours) at 04/16/2018 1437 Last data filed at 04/16/2018 1255 Gross per 24 hour  Intake 2245.57 ml  Output 2225 ml  Net 20.57 ml    LBM: Last BM Date: 04/15/18 Baseline Weight: Weight: 90.3 kg(scale a) Most recent weight: Weight: 89.9 kg     Palliative Assessment/Data:PPS 50%   Flowsheet Rows     Most Recent Value  Intake Tab  Referral Department  Hospitalist  Unit at Time of Referral  ER  Date Notified  04/15/18  Palliative Care Type  New Palliative care  Reason for referral  Clarify Goals of Care  Date of Admission  04/14/18  # of days IP prior to Palliative referral  1  Clinical Assessment  Psychosocial & Spiritual Assessment  Palliative Care Outcomes      Time In:  5909 Time Out: 1200 Time Total: 75 min  Greater than 50%  of this time was spent counseling and coordinating care related to the above assessment and plan.  Signed by:  Alda Lea, AGPCNP-BC Palliative Medicine Team  Phone: [phone] Fax: [phone] Pager: [phone] Amion: Bjorn Pippin    Please contact Palliative Medicine Team phone at [phone] for questions and concerns.  For individual provider: See Shea Evans

## 2018-04-16 NOTE — Progress Notes (Signed)
[facility] Progress Note  Patient: Daniel Morse QMV:[id number]   PCP: Sharilyn Sites, MD DOB: February 24, 1941   DOA: 04/14/2018   DOS: 04/16/2018   Date of Service: the patient was seen and examined on 04/16/2018  Brief hospital course: Pt. with PMH of chronic A. fib, HTN, HLD, GERD, alcohol abuse; admitted on 04/14/2018, presented with complaint of shortness of breath, was found to have acute kidney injury. Currently further plan is supportive measures.  Subjective: No chest pain abdominal pain.  No nausea.  No diarrhea.  Assessment and Plan: 1.  Acute kidney failure Etiology is unclear however the patient has had some recent testing with a CT scan back in January.   He did receive IV dye at that point but his renal function was normal.  Continue aggressive hydration. Nephrology consulted. We will follow-up. Currently no indication for dialysis.  2.  Hepatic steatosis:  Etiology is unclear may be related to prior heavy alcohol drinking.   Patient states he has quit drinking.   However his INR is markedly elevated and he has had recent episode of what appeared to be some hepatitis that may have been alcoholic hepatitis. Unsure as the etiology.   Patient was scheduled for an outpatient liver biopsy but currently we will hold off on that.  3.  Chronic A. fib with RVR. On Cardizem and Lopressor at home. It was continued at home regimen but his heart rate remained elevated with hypotension. Started on IV Cardizem drip. Transfer to stepdown unit. Continue to monitor. Continue Lopressor. Echocardiogram ordered. TSH normal. Do not suspect that the patient actually has ACS. Anticoagulation with Coumadin.  Pharmacy consulted.  INR subtherapeutic.  4.  Hypertension:  Pressures are soft.   Continue aggressive IV hydration.  5.  Gastroesophageal reflux disease: Continue proton pump inhibitor.  6.  Hyperlipidemia:  Continue present care.  7.  Alcohol abuse in remission:    Reportedly has a history of being a volatile and possibly heavy drinker. Continue CIWA protocol.  8. Body mass index is 32.01 kg/m.  Nutrition Problem: Moderate Malnutrition Etiology: chronic illness(hepatic steatosis) Interventions: Interventions: Ensure Enlive (each supplement provides 350kcal and 20 grams of protein), MVI  Diet: cardiac diet DVT Prophylaxis: on therapeutic anticoagulation.  Advance goals of care discussion: full code  Family Communication: no family was present at bedside, at the time of interview. Called family per request Opportunity was given to ask question and all questions were answered satisfactorily.   Disposition:  Discharge to home.  Consultants: none Procedures: Echocardiogram   Scheduled Meds: . feeding supplement (ENSURE ENLIVE)  237 mL Oral BID BM  . folic acid  1 mg Oral Daily  . levothyroxine  50 mcg Oral Q0600  . LORazepam  0-4 mg Intravenous Q6H   Followed by  . LORazepam  0-4 mg Intravenous Q12H  . metoprolol tartrate  50 mg Oral BID  . multivitamin with minerals  1 tablet Oral Daily  . pantoprazole  40 mg Oral Daily  . sodium bicarbonate  650 mg Oral TID  . sodium chloride flush  3 mL Intravenous Q12H  . thiamine  100 mg Oral Daily  . Warfarin - Pharmacist Dosing Inpatient   Does not apply q1800   Continuous Infusions: . sodium chloride 75 mL/hr at 04/16/18 1723  . diltiazem (CARDIZEM) infusion 15 mg/hr (04/16/18 1807)   PRN Meds: LORazepam **OR** LORazepam, ondansetron **OR** ondansetron (ZOFRAN) IV, oxyCODONE, traZODone Antibiotics: Anti-infectives (From admission, onward)   None  Objective: Physical Exam: Vitals:   04/16/18 0849 04/16/18 0900 04/16/18 1253 04/16/18 1637  BP: 95/62 102/72 97/72 101/70  Pulse: (!) 104 89 (!) 104 (!) 103  Resp:   18   Temp:   98.1 F (36.7 C) 98.6 F (37 C)  TempSrc:   Oral Oral  SpO2:  95% 100% 96%  Weight:      Height:        Intake/Output Summary (Last 24 hours) at  04/16/2018 1818 Last data filed at 04/16/2018 1600 Gross per 24 hour  Intake 1828.13 ml  Output 2250 ml  Net -421.87 ml   Filed Weights   04/14/18 2016 04/15/18 0500  Weight: 90.3 kg 89.9 kg   General: Alert, Awake and Oriented to Time, Place and Person. Appear in mild distress, affect appropriate Eyes: PERRL, Conjunctival redness ENT: Oral Mucosa clear dry. Neck: no JVD, no Abnormal Mass Or lumps Cardiovascular: S1 and S2 Present, aortic systolic  Murmur, Peripheral Pulses Present Respiratory: normal respiratory effort, Bilateral Air entry equal and Decreased, no use of accessory muscle, Clear to Auscultation, no Crackles, no wheezes Abdomen: Bowel Sound present, Soft and no tenderness, no hernia Skin: no redness, no Rash, no induration Extremities: no Pedal edema, no calf tenderness Neurologic: Grossly no focal neuro deficit. Bilaterally Equal motor strength  Data Reviewed: CBC: Recent Labs  Lab 04/14/18 0936 04/14/18 1323 04/15/18 0643 04/16/18 0306  WBC 17.5* 15.7* 16.7* 19.2*  NEUTROABS  --  13.0*  --   --   HGB 14.6 13.3 13.8 12.4*  HCT 44.2 39.1 41.7 36.0*  MCV 96.5 91.1 91.4 91.1  PLT 550* 440* 447* 675   Basic Metabolic Panel: Recent Labs  Lab 04/14/18 1323 04/15/18 0643 04/16/18 0306  NA 134* 136 135  K 4.6 4.2 3.6  CL 101 100 102  CO2 15* 23 19*  GLUCOSE 76 83 98  BUN 109* 106* 92*  CREATININE 8.46* 8.16* 6.98*  CALCIUM 7.5* 7.7* 7.2*  MG  --   --  1.7  PHOS  --   --  5.2*    Liver Function Tests: Recent Labs  Lab 04/14/18 1323 04/15/18 0643 04/16/18 0306  AST 50* 51*  --   ALT 40 41  --   ALKPHOS 71 84  --   BILITOT 1.7* 2.4*  --   PROT 8.1 8.6*  --   ALBUMIN 1.8* 2.0* 1.7*   No results for input(s): LIPASE, AMYLASE in the last 168 hours. Recent Labs  Lab 04/14/18 1323  AMMONIA 12   Coagulation Profile: Recent Labs  Lab 04/14/18 1023 04/14/18 1323 04/15/18 0643 04/16/18 0306  INR 9.01* 9.16* 1.84 1.60   Cardiac  Enzymes: Recent Labs  Lab 04/14/18 2110 04/15/18 0034 04/15/18 0643  TROPONINI <0.03 <0.03 <0.03   BNP (last 3 results) No results for input(s): PROBNP in the last 8760 hours. CBG: No results for input(s): GLUCAP in the last 168 hours. Studies: No results found.   Time spent: 35 minutes  Author: Berle Mull, MD [facility] 04/16/2018 6:18 PM  To reach On-call, see care teams to locate the attending and reach out to them via [email or web address]. If 7PM-7AM, please contact night-coverage If you still have difficulty reaching the attending provider, please page the [facility] (Director on Call) for [facility] on amion for assistance.

## 2018-04-16 NOTE — Progress Notes (Signed)
  Echocardiogram 2D Echocardiogram has been performed.  Daniel Morse 04/16/2018, 12:13 PM

## 2018-04-17 ENCOUNTER — Inpatient Hospital Stay (HOSPITAL_COMMUNITY): Payer: Medicare HMO

## 2018-04-17 DIAGNOSIS — Z515 Encounter for palliative care: Secondary | ICD-10-CM

## 2018-04-17 DIAGNOSIS — R791 Abnormal coagulation profile: Secondary | ICD-10-CM

## 2018-04-17 DIAGNOSIS — I4891 Unspecified atrial fibrillation: Secondary | ICD-10-CM

## 2018-04-17 DIAGNOSIS — Z7189 Other specified counseling: Secondary | ICD-10-CM

## 2018-04-17 DIAGNOSIS — Z66 Do not resuscitate: Secondary | ICD-10-CM

## 2018-04-17 DIAGNOSIS — E782 Mixed hyperlipidemia: Secondary | ICD-10-CM

## 2018-04-17 DIAGNOSIS — E440 Moderate protein-calorie malnutrition: Secondary | ICD-10-CM

## 2018-04-17 DIAGNOSIS — F1011 Alcohol abuse, in remission: Secondary | ICD-10-CM

## 2018-04-17 LAB — URINALYSIS, COMPLETE (UACMP) WITH MICROSCOPIC
Bilirubin Urine: NEGATIVE
Glucose, UA: NEGATIVE mg/dL
Ketones, ur: NEGATIVE mg/dL
Leukocytes,Ua: NEGATIVE
Nitrite: NEGATIVE
Protein, ur: NEGATIVE mg/dL
Specific Gravity, Urine: 1.006 (ref 1.005–1.030)
pH: 6 (ref 5.0–8.0)

## 2018-04-17 LAB — CBC WITH DIFFERENTIAL/PLATELET
Abs Immature Granulocytes: 0.17 10*3/uL — ABNORMAL HIGH (ref 0.00–0.07)
Basophils Absolute: 0.1 10*3/uL (ref 0.0–0.1)
Basophils Relative: 0 %
Eosinophils Absolute: 0.1 10*3/uL (ref 0.0–0.5)
Eosinophils Relative: 0 %
HEMATOCRIT: 35.4 % — AB (ref 39.0–52.0)
HEMOGLOBIN: 11.7 g/dL — AB (ref 13.0–17.0)
Immature Granulocytes: 1 %
Lymphocytes Relative: 7 %
Lymphs Abs: 1.4 10*3/uL (ref 0.7–4.0)
MCH: 30.3 pg (ref 26.0–34.0)
MCHC: 33.1 g/dL (ref 30.0–36.0)
MCV: 91.7 fL (ref 80.0–100.0)
Monocytes Absolute: 0.6 10*3/uL (ref 0.1–1.0)
Monocytes Relative: 3 %
Neutro Abs: 18.2 10*3/uL — ABNORMAL HIGH (ref 1.7–7.7)
Neutrophils Relative %: 89 %
Platelets: 328 10*3/uL (ref 150–400)
RBC: 3.86 MIL/uL — ABNORMAL LOW (ref 4.22–5.81)
RDW: 13.5 % (ref 11.5–15.5)
WBC: 20.5 10*3/uL — ABNORMAL HIGH (ref 4.0–10.5)
nRBC: 0 % (ref 0.0–0.2)

## 2018-04-17 LAB — CBC
HCT: 34.6 % — ABNORMAL LOW (ref 39.0–52.0)
Hemoglobin: 11.8 g/dL — ABNORMAL LOW (ref 13.0–17.0)
MCH: 31.2 pg (ref 26.0–34.0)
MCHC: 34.1 g/dL (ref 30.0–36.0)
MCV: 91.5 fL (ref 80.0–100.0)
Platelets: 341 K/uL (ref 150–400)
RBC: 3.78 MIL/uL — ABNORMAL LOW (ref 4.22–5.81)
RDW: 13.5 % (ref 11.5–15.5)
WBC: 21.2 K/uL — ABNORMAL HIGH (ref 4.0–10.5)
nRBC: 0 % (ref 0.0–0.2)

## 2018-04-17 LAB — COMPREHENSIVE METABOLIC PANEL
ALT: 30 U/L (ref 0–44)
AST: 36 U/L (ref 15–41)
Albumin: 1.6 g/dL — ABNORMAL LOW (ref 3.5–5.0)
Alkaline Phosphatase: 69 U/L (ref 38–126)
Anion gap: 12 (ref 5–15)
BUN: 80 mg/dL — ABNORMAL HIGH (ref 8–23)
CO2: 20 mmol/L — ABNORMAL LOW (ref 22–32)
Calcium: 7.5 mg/dL — ABNORMAL LOW (ref 8.9–10.3)
Chloride: 103 mmol/L (ref 98–111)
Creatinine, Ser: 6.17 mg/dL — ABNORMAL HIGH (ref 0.61–1.24)
GFR calc Af Amer: 9 mL/min — ABNORMAL LOW (ref >60)
GFR calc non Af Amer: 8 mL/min — ABNORMAL LOW (ref >60)
Glucose, Bld: 99 mg/dL (ref 70–99)
POTASSIUM: 3.6 mmol/L (ref 3.5–5.1)
Sodium: 135 mmol/L (ref 135–145)
Total Bilirubin: 2.6 mg/dL — ABNORMAL HIGH (ref 0.3–1.2)
Total Protein: 7.5 g/dL (ref 6.5–8.1)

## 2018-04-17 LAB — PROTIME-INR
INR: 1.64
Prothrombin Time: 19.2 s — ABNORMAL HIGH (ref 11.4–15.2)

## 2018-04-17 LAB — FIBRINOGEN: Fibrinogen: 459 mg/dL (ref 210–475)

## 2018-04-17 LAB — MAGNESIUM: Magnesium: 2 mg/dL (ref 1.7–2.4)

## 2018-04-17 LAB — PHOSPHORUS: Phosphorus: 5 mg/dL — ABNORMAL HIGH (ref 2.5–4.6)

## 2018-04-17 LAB — PROCALCITONIN: PROCALCITONIN: 0.45 ng/mL

## 2018-04-17 LAB — LACTIC ACID, PLASMA: Lactic Acid, Venous: 1.4 mmol/L (ref 0.5–1.9)

## 2018-04-17 MED ORDER — WARFARIN SODIUM 5 MG PO TABS
5.0000 mg | ORAL_TABLET | Freq: Once | ORAL | Status: AC
  Administered 2018-04-17: 5 mg via ORAL
  Filled 2018-04-17: qty 1

## 2018-04-17 MED ORDER — DILTIAZEM HCL 60 MG PO TABS
90.0000 mg | ORAL_TABLET | Freq: Four times a day (QID) | ORAL | Status: DC
  Administered 2018-04-17 – 2018-04-18 (×5): 90 mg via ORAL
  Filled 2018-04-17 (×5): qty 2

## 2018-04-17 NOTE — Progress Notes (Signed)
[facility] Progress Note   Subjective:   Feeling generally improved today.  No new complaints.   Objective Vitals:   04/17/18 0800 04/17/18 0937 04/17/18 1000 04/17/18 1200  BP: (!) 105/59 101/61 109/70 109/76  Pulse: 71 89 (!) 101 99  Resp: 17  20 (!) 23  Temp: 98.7 F (37.1 C)   98.2 F (36.8 C)  TempSrc: Oral   Oral  SpO2: 93%  99% 92%  Weight:      Height:       Physical Exam General: elderly man lying flat in bed in no distress Eyes: conjunctiva injected BL, L eye blind ENT: MMM Heart: tachy, irregular, no rub Lungs: normal WOB Abdomen: soft, nontender Extremities: no edema Skin: no rashes  Additional Objective Labs: Basic Metabolic Panel: Recent Labs  Lab 04/15/18 0643 04/16/18 0306 04/17/18 0826  NA 136 135 135  K 4.2 3.6 3.6  CL 100 102 103  CO2 23 19* 20*  GLUCOSE 83 98 99  BUN 106* 92* 80*  CREATININE 8.16* 6.98* 6.17*  CALCIUM 7.7* 7.2* 7.5*  PHOS  --  5.2* 5.0*   Liver Function Tests: Recent Labs  Lab 04/14/18 1323 04/15/18 0643 04/16/18 0306 04/17/18 0826  AST 50* 51*  --  36  ALT 40 41  --  30  ALKPHOS 71 84  --  69  BILITOT 1.7* 2.4*  --  2.6*  PROT 8.1 8.6*  --  7.5  ALBUMIN 1.8* 2.0* 1.7* 1.6*   No results for input(s): LIPASE, AMYLASE in the last 168 hours. CBC: Recent Labs  Lab 04/14/18 0936 04/14/18 1323 04/15/18 0643 04/16/18 0306 04/17/18 0356  WBC 17.5* 15.7* 16.7* 19.2* 20.5*  21.2*  NEUTROABS  --  13.0*  --   --  18.2*  HGB 14.6 13.3 13.8 12.4* 11.7*  11.8*  HCT 44.2 39.1 41.7 36.0* 35.4*  34.6*  MCV 96.5 91.1 91.4 91.1 91.7  91.5  PLT 550* 440* 447* 367 328  341   Blood Culture    Component Value Date/Time   SDES URINE, RANDOM 04/14/2018 1548   SPECREQUEST NONE 04/14/2018 1548   CULT (A) 04/14/2018 1548    <10,000 COLONIES/mL INSIGNIFICANT GROWTH Performed at [facility] [address], [place], [place] [address]    REPTSTATUS 04/15/2018 FINAL 04/14/2018 1548    Cardiac  Enzymes: Recent Labs  Lab 04/14/18 2110 04/15/18 0034 04/15/18 0643  TROPONINI <0.03 <0.03 <0.03   CBG: No results for input(s): GLUCAP in the last 168 hours. Iron Studies: No results for input(s): IRON, TIBC, TRANSFERRIN, FERRITIN in the last 72 hours. $RemoveB'@lablastinr3'XoBekygh$ @ Studies/Results: Dg Chest Port 1 View  Result Date: 04/17/2018 CLINICAL DATA:  Leukocytosis.  Hypertension. EXAM: PORTABLE CHEST 1 VIEW COMPARISON:  April 06, 2018 FINDINGS: There is scarring along the left base. There are apparent calcified pleural plaques bilaterally. Calcification is also seen along the left pleural surface. There is no edema or consolidation. Heart is upper normal in size with pulmonary vascularity normal. No adenopathy. There is aortic atherosclerosis. No bone lesions. IMPRESSION: Apparent pleural plaques and left pleural calcification. These are changes likely indicative of previous asbestos exposure. Mild scarring left base. No edema or consolidation evident. Stable cardiac silhouette. Aortic Atherosclerosis (ICD10-I70.0). Electronically Signed   By: Lowella Grip III M.D.   On: 04/17/2018 09:33   Medications: . sodium chloride 75 mL/hr at 04/17/18 0559   . diltiazem  90 mg Oral Q6H  . feeding supplement (ENSURE ENLIVE)  237 mL  Oral BID BM  . folic acid  1 mg Oral Daily  . levothyroxine  50 mcg Oral Q0600  . LORazepam  0-4 mg Intravenous Q12H  . metoprolol tartrate  50 mg Oral BID  . multivitamin with minerals  1 tablet Oral Daily  . pantoprazole  40 mg Oral Daily  . sodium bicarbonate  650 mg Oral TID  . sodium chloride flush  3 mL Intravenous Q12H  . thiamine  100 mg Oral Daily  . warfarin  5 mg Oral ONCE-1800  . Warfarin - Pharmacist Dosing Inpatient   Does not apply q1800    2/11 CT:  Noting - Nonspecific mild perinephric fat stranding with renovascular calcifications bilaterally. No obstructive uropathy, renal mass or nephrolithiasis. Stable 14 mm cystic focus off the lower pole the  left kidney with associated mild renal cortical scarring.  Assessment/Plan: 1.  AKI, severe, nonoliguric:  Baseline normal 03/2017.  Continued slow improvement in renal function today (2/11 8.5 >  2/13 6.98 > 2/14 6.17), balanced electrolytes and no indications for dialysis.   SPEP with polyclonal gammopathy, UPEP with M spike though IFE pending.  Check serum free LC.  UP/C 0.28.   ANA neg.  Imaging unrevealing.   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 Hematuria present. I think this is likely the culprit.  I think it's ok to resume warfarin with conservative dosing and careful monitoring.   I proposed watchful waiting for now and he's in agreement.   PTH 133, suggesting this may not be a complete acute event.    2.  Leukocytosis: unclear etiology. Afebrile, no localizing s/s infection.  Hb was normal on presentation, now 12.4, Plt count normal.   3.  Transaminitis: following with hepatology, liver biopsy aborted due to INR 9, though his transaminitis is now resolved.    4.  Atrial fibrillation:  Per primary.   5.  Metabolic acidosis:  Cont na bicarb 650 TID.   Jannifer Hick MD 04/17/2018, 3:15 PM  [facility] Pager: [phone]

## 2018-04-17 NOTE — Progress Notes (Signed)
[facility] Progress Note  Patient: Daniel Morse NAT:[id number]   PCP: Sharilyn Sites, MD DOB: 1940/04/09   DOA: 04/14/2018   DOS: 04/17/2018   Date of Service: the patient was seen and examined on 04/17/2018  Brief hospital course: Pt. with PMH of chronic A. fib, HTN, HLD, GERD, alcohol abuse; admitted on 04/14/2018, presented with complaint of shortness of breath, was found to have acute kidney injury. Currently further plan is supportive measures.  Subjective: Patient denies any acute complaint.  No nausea no vomiting no fever no chills no chest pain no abdominal pain no diarrhea.  No constipation.  Assessment and Plan: 1.  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   He did receive IV dye at that point but his renal function was normal.  Continue aggressive hydration. Nephrology consulted. Appreciate assistance Currently no indication for dialysis.  2.  Hepatic steatosis:  Etiology is unclear may be related to prior heavy alcohol drinking.   Patient states he has quit drinking.   However his INR is markedly elevated and he has had recent episode of what appeared to be some hepatitis that may have been alcoholic hepatitis. Unsure as the etiology.   Patient was scheduled for an outpatient liver biopsy but currently we will hold off on that.  3.  Chronic A. fib with RVR. On Cardizem and Lopressor at home. It was continued at home regimen but his heart rate remained elevated with hypotension. Started on IV Cardizem drip.  Transition to oral Cardizem. Transfer to stepdown unit. Continue to monitor. Continue Lopressor. Echocardiogram shows preserved EF, no acute abnormality. TSH normal. Do not suspect that the patient actually has ACS. Anticoagulation with Coumadin.  Pharmacy consulted.  INR subtherapeutic.  4.  Hypertension:  Pressures are soft.     Continue aggressive IV hydration.  5.  Gastroesophageal reflux disease: Continue proton pump inhibitor.  6.  Hyperlipidemia:  Continue present care.  7.  Alcohol abuse in remission:  Reportedly has a history of being a volatile and possibly heavy drinker. Continue CIWA protocol.  8. Body mass index is 32.01 kg/m.  Nutrition Problem: Moderate Malnutrition Etiology: chronic illness(hepatic steatosis) Interventions: Interventions: Ensure Enlive (each supplement provides 350kcal and 20 grams of protein), MVI   9.  Leukocytosis. WBC elevated with an upward trend despite IV hydration which should cause dilution. No fevers overnight. Patient reports no other acute new abnormality. Chest x-ray unremarkable for any pneumonia. Does not have any urinary symptoms.  UA unremarkable. We will perform blood cultures.  Procalcitonin minimally elevated.  Lactic acid normal. Currently holding off on any antibiotic initiation and will monitor.   Diet: cardiac diet DVT Prophylaxis: on therapeutic anticoagulation.  Advance goals of care discussion: full code  Family Communication: no family was present at bedside, at the time of interview. Called family per request Opportunity was given to ask question and all questions were answered satisfactorily.   Disposition:  Discharge to home.  Consultants: Nephrology Procedures: Echocardiogram   Scheduled Meds: . diltiazem  90 mg Oral Q6H  . feeding supplement (ENSURE ENLIVE)  237 mL Oral BID BM  . folic acid  1 mg Oral Daily  . levothyroxine  50 mcg Oral Q0600  . LORazepam  0-4 mg Intravenous Q12H  . metoprolol tartrate  50 mg Oral BID  . multivitamin with minerals  1 tablet Oral Daily  . pantoprazole  40 mg  Oral Daily  . sodium bicarbonate  650 mg Oral TID  . sodium chloride flush  3 mL Intravenous Q12H  . thiamine  100 mg Oral Daily  . Warfarin - Pharmacist Dosing Inpatient   Does not apply q1800   Continuous Infusions: . sodium  chloride 75 mL/hr at 04/17/18 1730   PRN Meds: ondansetron **OR** ondansetron (ZOFRAN) IV, oxyCODONE, traZODone Antibiotics: Anti-infectives (From admission, onward)   None       Objective: Physical Exam: Vitals:   04/17/18 1500 04/17/18 1645 04/17/18 1729 04/17/18 1800  BP: 105/64 102/65 107/65 101/65  Pulse: 89 95 92 (!) 101  Resp: 18 17    Temp:  98.2 F (36.8 C)    TempSrc:  Oral    SpO2: 97% 96%    Weight:      Height:        Intake/Output Summary (Last 24 hours) at 04/17/2018 1907 Last data filed at 04/17/2018 0800 Gross per 24 hour  Intake 1370.5 ml  Output 1000 ml  Net 370.5 ml   Filed Weights   04/14/18 2016 04/15/18 0500  Weight: 90.3 kg 89.9 kg   General: Alert, Awake and Oriented to Time, Place and Person. Appear in mild distress, affect appropriate Eyes: PERRL, Conjunctival redness ENT: Oral Mucosa clear dry. Neck: no JVD, no Abnormal Mass Or lumps Cardiovascular: S1 and S2 Present, aortic systolic  Murmur, Peripheral Pulses Present Respiratory: normal respiratory effort, Bilateral Air entry equal and Decreased, no use of accessory muscle, Clear to Auscultation, no Crackles, no wheezes Abdomen: Bowel Sound present, Soft and no tenderness, no hernia Skin: no redness, no Rash, no induration Extremities: no Pedal edema, no calf tenderness Neurologic: Grossly no focal neuro deficit. Bilaterally Equal motor strength  Data Reviewed: CBC: Recent Labs  Lab 04/14/18 0936 04/14/18 1323 04/15/18 0643 04/16/18 0306 04/17/18 0356  WBC 17.5* 15.7* 16.7* 19.2* 20.5*  21.2*  NEUTROABS  --  13.0*  --   --  18.2*  HGB 14.6 13.3 13.8 12.4* 11.7*  11.8*  HCT 44.2 39.1 41.7 36.0* 35.4*  34.6*  MCV 96.5 91.1 91.4 91.1 91.7  91.5  PLT 550* 440* 447* 367 328  086   Basic Metabolic Panel: Recent Labs  Lab 04/14/18 1323 04/15/18 0643 04/16/18 0306 04/17/18 0826  NA 134* 136 135 135  K 4.6 4.2 3.6 3.6  CL 101 100 102 103  CO2 15* 23 19* 20*  GLUCOSE 76 83  98 99  BUN 109* 106* 92* 80*  CREATININE 8.46* 8.16* 6.98* 6.17*  CALCIUM 7.5* 7.7* 7.2* 7.5*  MG  --   --  1.7 2.0  PHOS  --   --  5.2* 5.0*    Liver Function Tests: Recent Labs  Lab 04/14/18 1323 04/15/18 0643 04/16/18 0306 04/17/18 0826  AST 50* 51*  --  36  ALT 40 41  --  30  ALKPHOS 71 84  --  69  BILITOT 1.7* 2.4*  --  2.6*  PROT 8.1 8.6*  --  7.5  ALBUMIN 1.8* 2.0* 1.7* 1.6*   No results for input(s): LIPASE, AMYLASE in the last 168 hours. Recent Labs  Lab 04/14/18 1323  AMMONIA 12   Coagulation Profile: Recent Labs  Lab 04/14/18 1023 04/14/18 1323 04/15/18 0643 04/16/18 0306 04/17/18 0356  INR 9.01* 9.16* 1.84 1.60 1.64   Cardiac Enzymes: Recent Labs  Lab 04/14/18 2110 04/15/18 0034 04/15/18 0643  TROPONINI <0.03 <0.03 <0.03   BNP (last 3 results) No results for input(s):  PROBNP in the last 8760 hours. CBG: No results for input(s): GLUCAP in the last 168 hours. Studies: Dg Chest Port 1 View  Result Date: 04/17/2018 CLINICAL DATA:  Leukocytosis.  Hypertension. EXAM: PORTABLE CHEST 1 VIEW COMPARISON:  April 06, 2018 FINDINGS: There is scarring along the left base. There are apparent calcified pleural plaques bilaterally. Calcification is also seen along the left pleural surface. There is no edema or consolidation. Heart is upper normal in size with pulmonary vascularity normal. No adenopathy. There is aortic atherosclerosis. No bone lesions. IMPRESSION: Apparent pleural plaques and left pleural calcification. These are changes likely indicative of previous asbestos exposure. Mild scarring left base. No edema or consolidation evident. Stable cardiac silhouette. Aortic Atherosclerosis (ICD10-I70.0). Electronically Signed   By: Lowella Grip III M.D.   On: 04/17/2018 09:33     Time spent: 35 minutes  Author: Berle Mull, MD [facility] 04/17/2018 7:07 PM  To reach On-call, see care teams to locate the attending and reach out to them via  [email or web address]. If 7PM-7AM, please contact night-coverage If you still have difficulty reaching the attending provider, please page the [facility] (Director on Call) for [facility] on amion for assistance.

## 2018-04-17 NOTE — Progress Notes (Signed)
Daily Progress Note   Patient Name: Daniel Morse       Date: 04/17/2018 DOB: 1940/07/19  Age: 78 y.o. MRN#: [id number] Attending Physician: Lavina Hamman, MD Primary Care Physician: Sharilyn Sites, MD Admit Date: 04/14/2018  Reason for Consultation/Follow-up: Establishing goals of care  Subjective: Patient awake, A&O x3. Denies pain or shortness of breath. Express he is feeling much better each day and hopefully getting close to going home and getting in his own bed.   We briefly discussed our goals of care conversation on yesterday. Patient able to verbalized conversations and again continues to decline outpatient Palliative services. Confirms his wishes for DNR/DNI and expressed wishes of not interested in HD if needed. Support given. Patient is hopeful for his wife to visit later and she will bring him a frosty from [place]. Also expressed satisfaction in knowing his labwork continues to look better each day as expressed by Nephrology. Support given.   Length of Stay: 3  Current Medications: Scheduled Meds:  . diltiazem  90 mg Oral Q6H  . feeding supplement (ENSURE ENLIVE)  237 mL Oral BID BM  . folic acid  1 mg Oral Daily  . levothyroxine  50 mcg Oral Q0600  . LORazepam  0-4 mg Intravenous Q12H  . metoprolol tartrate  50 mg Oral BID  . multivitamin with minerals  1 tablet Oral Daily  . pantoprazole  40 mg Oral Daily  . sodium bicarbonate  650 mg Oral TID  . sodium chloride flush  3 mL Intravenous Q12H  . thiamine  100 mg Oral Daily  . warfarin  5 mg Oral ONCE-1800  . Warfarin - Pharmacist Dosing Inpatient   Does not apply q1800    Continuous Infusions: . sodium chloride 75 mL/hr at 04/17/18 0559    PRN Meds: LORazepam **OR** LORazepam, ondansetron **OR** ondansetron  (ZOFRAN) IV, oxyCODONE, traZODone  Physical Exam Vitals signs and nursing note reviewed.  Constitutional:      General: He is awake.     Appearance: Normal appearance.     Comments: Chronically ill appearing   Eyes:     Comments: Redness to both sclera   Cardiovascular:     Rate and Rhythm: Tachycardia present.     Pulses: Normal pulses.     Heart sounds: Murmur present.  Pulmonary:  Effort: Pulmonary effort is normal.     Breath sounds: Decreased breath sounds present.  Abdominal:     General: Bowel sounds are normal.     Palpations: Abdomen is soft.  Skin:    General: Skin is warm and dry.     Findings: Bruising present.  Neurological:     Mental Status: He is alert and oriented to person, place, and time.           Vital Signs: BP 109/70   Pulse (!) 101   Temp 98.7 F (37.1 C) (Oral)   Resp 20   Ht $R'5\' 6"'Pl$  (1.676 m)   Wt 89.9 kg   SpO2 99%   BMI 32.01 kg/m  SpO2: SpO2: 99 % O2 Device: O2 Device: Room Air O2 Flow Rate:    Intake/output summary:   Intake/Output Summary (Last 24 hours) at 04/17/2018 1215 Last data filed at 04/17/2018 0800 Gross per 24 hour  Intake 1521.33 ml  Output 1500 ml  Net 21.33 ml   LBM: Last BM Date: 04/16/18 Baseline Weight: Weight: 90.3 kg(scale a) Most recent weight: Weight: 89.9 kg   Palliative Assessment/Data:PPS 50%    Flowsheet Rows     Most Recent Value  Intake Tab  Referral Department  Hospitalist  Unit at Time of Referral  ER  Date Notified  04/15/18  Palliative Care Type  New Palliative care  Reason for referral  Clarify Goals of Care  Date of Admission  04/14/18  # of days IP prior to Palliative referral  1  Clinical Assessment  Psychosocial & Spiritual Assessment  Palliative Care Outcomes      Patient Active Problem List   Diagnosis Date Noted  . Malnutrition of moderate degree 04/15/2018  . Warfarin toxicity 04/14/2018  . Acute kidney failure ([place]) 04/14/2018  . Alcohol abuse, in remission 04/14/2018    . Hepatic steatosis 04/14/2018  . Perirectal abscess   . Family hx of colon cancer 04/19/2016  . History of colonic polyps 04/19/2016  . Encounter for therapeutic drug monitoring 04/14/2013  . Hypokalemia 12/03/2012  . Peripheral vascular disease ([place]) 11/05/2012  . Tobacco abuse   . Cerebrovascular disease   . Nephrolithiasis   . Hyperlipidemia   . Hypertension   . GERD (gastroesophageal reflux disease)   . Atrial fibrillation     Palliative Care Assessment & Plan   Patient Profile: 79 y.o. male  admitted on 04/14/2018 from home after presenting for a liver biopsy and INR found to be 9. He has a past medical history of atrial fibrillation (Coumadin), GERD, hypertension, hyperlipidemia, macular degeneration (legally blind), peripheral vascular disease, and kidney stones. During his ED course he denied bleeding, chest pain, or shortness of breath. Family also reports patient is a heavy alcohol drinker however, does not wish for [place] to disclose to patient that we are aware as he reportedly quit about 8 weeks ago. 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 Previous ultrasound of scrotum showed epididymitis. Since admission patient is followed by Nephrology, no recommendations for HD at this time. He has shown some minor improvement with kidney function, watchful waiting in regards to kidney function. Palliative Medicine team consulted for goals of care.   Recommendations/Plan:  DNR/DNI  Continue to treat.  Decline outpatient palliative at this time. Appreciates current support but not interested in "people coming in checking on him" when he is at home!   PMT will continue to support and follow as needed.   Goals of Care and  Additional Recommendations:  Limitations on Scope of Treatment: Full Scope Treatment  Code Status:    Code Status Orders  (From admission, onward)         Start     Ordered   04/16/18 1437  Do not attempt resuscitation (DNR)  Continuous    Question Answer Comment  In the event of cardiac or respiratory ARREST Do not call a "code blue"   In the event of cardiac or respiratory ARREST Do not perform Intubation, CPR, defibrillation or ACLS   In the event of cardiac or respiratory ARREST Use medication by any route, position, wound care, and other measures to relive pain and suffering. May use oxygen, suction and manual treatment of airway obstruction as needed for comfort.      04/16/18 1436        Code Status History    Date Active Date Inactive Code Status Order ID Comments User Context   04/14/2018 1856 04/16/2018 1436 Full Code [id number]  Lady Deutscher, MD ED      Prognosis:   Guarded   Discharge Planning:  To Be Determined  Care plan was discussed with patient and bedside RN.   Thank you for allowing the Palliative Medicine Team to assist in the care of this patient.   Total Time 35 min. Prolonged Time Billed  NO       Greater than 50%  of this time was spent counseling and coordinating care related to the above assessment and plan.  Jobe Gibbon, NP  Please contact Palliative Medicine Team phone at [phone] for questions and concerns.

## 2018-04-17 NOTE — Progress Notes (Signed)
[facility] for warfarin Indication: atrial fibrillation  Allergies  Allergen Reactions  . Statins Swelling    Legs hurt   Labs: Recent Labs    04/14/18 1323 04/14/18 2110 04/15/18 0034 04/15/18 0643 04/16/18 0306 04/17/18 0356 04/17/18 0826  HGB 13.3  --   --  13.8 12.4* 11.7*  11.8*  --   HCT 39.1  --   --  41.7 36.0* 35.4*  34.6*  --   PLT 440*  --   --  447* 367 328  341  --   APTT 79*  --   --   --   --   --   --   LABPROT 72.9*  --   --  21.0* 18.9* 19.2*  --   INR 9.16*  --   --  1.84 1.60 1.64  --   CREATININE 8.46*  --   --  8.16* 6.98*  --  6.17*  TROPONINI  --  <0.03 <0.03 <0.03  --   --   --     Estimated Creatinine Clearance: 10.9 mL/min (A) (by C-G formula based on SCr of 6.17 mg/dL (H)).   Assessment: [name] on warfarin PTA for atrial fibrillation. Dose PTA 3.75 mg on Tues, Thurs, and Saturdays; 2.5 mg on all other days of the week. Was instructed by Dr. Laural Golden to hold warfarin on 2/5 with elevated LFTs, weakness, and poor appetite.  INR on admission was elevated at 9.01 despite holding warfarin. S/p vitamin K 5 mg IV x 1 on 2/11.   INR = 1.64 (overcoming vitamin K)  Goal of Therapy:  INR 2-3 Monitor platelets by anticoagulation protocol: Yes   Plan:  Warfarin 5 mg po x 1 dose at 1800 pm Daily INR and CBC Monitor for bleeding  Thank you Anette Guarneri, PharmD [phone] 04/17/2018       10:25 AM

## 2018-04-17 NOTE — Care Management Important Message (Signed)
Important Message  Patient Details  Name: Daniel Morse MRN: [id number] Date of Birth: Apr 29, 1940   Medicare Important Message Given:  Yes    Barb Merino [place] 04/17/2018, 11:50 AM

## 2018-04-18 LAB — CBC
HCT: 33.5 % — ABNORMAL LOW (ref 39.0–52.0)
Hemoglobin: 11.1 g/dL — ABNORMAL LOW (ref 13.0–17.0)
MCH: 30.7 pg (ref 26.0–34.0)
MCHC: 33.1 g/dL (ref 30.0–36.0)
MCV: 92.5 fL (ref 80.0–100.0)
Platelets: 331 10*3/uL (ref 150–400)
RBC: 3.62 MIL/uL — ABNORMAL LOW (ref 4.22–5.81)
RDW: 13.8 % (ref 11.5–15.5)
WBC: 22.5 10*3/uL — ABNORMAL HIGH (ref 4.0–10.5)
nRBC: 0 % (ref 0.0–0.2)

## 2018-04-18 LAB — RENAL FUNCTION PANEL
Albumin: 1.4 g/dL — ABNORMAL LOW (ref 3.5–5.0)
Anion gap: 12 (ref 5–15)
BUN: 76 mg/dL — AB (ref 8–23)
CALCIUM: 7.7 mg/dL — AB (ref 8.9–10.3)
CO2: 21 mmol/L — ABNORMAL LOW (ref 22–32)
Chloride: 103 mmol/L (ref 98–111)
Creatinine, Ser: 5.82 mg/dL — ABNORMAL HIGH (ref 0.61–1.24)
GFR calc Af Amer: 10 mL/min — ABNORMAL LOW (ref >60)
GFR calc non Af Amer: 9 mL/min — ABNORMAL LOW (ref >60)
Glucose, Bld: 103 mg/dL — ABNORMAL HIGH (ref 70–99)
Phosphorus: 5 mg/dL — ABNORMAL HIGH (ref 2.5–4.6)
Potassium: 3.2 mmol/L — ABNORMAL LOW (ref 3.5–5.1)
Sodium: 136 mmol/L (ref 135–145)

## 2018-04-18 LAB — PROTIME-INR
INR: 1.92
Prothrombin Time: 21.7 seconds — ABNORMAL HIGH (ref 11.4–15.2)

## 2018-04-18 MED ORDER — GUAIFENESIN-DM 100-10 MG/5ML PO SYRP
5.0000 mL | ORAL_SOLUTION | ORAL | Status: DC | PRN
  Administered 2018-04-18: 5 mL via ORAL
  Filled 2018-04-18: qty 5

## 2018-04-18 MED ORDER — DILTIAZEM HCL ER COATED BEADS 240 MG PO CP24
240.0000 mg | ORAL_CAPSULE | Freq: Every day | ORAL | Status: DC
  Administered 2018-04-18: 240 mg via ORAL
  Filled 2018-04-18: qty 1

## 2018-04-18 MED ORDER — WARFARIN SODIUM 2.5 MG PO TABS
3.7500 mg | ORAL_TABLET | Freq: Once | ORAL | Status: AC
  Administered 2018-04-18: 3.75 mg via ORAL
  Filled 2018-04-18: qty 1

## 2018-04-18 NOTE — Progress Notes (Signed)
[facility] Progress Note  Patient: Daniel Morse XBJ:[id number]   PCP: Sharilyn Sites, MD DOB: [birth year]   DOA: 04/14/2018   DOS: 04/18/2018   Date of Service: the patient was seen and examined on 04/18/2018  Brief hospital course: Pt. with PMH of chronic A. fib, HTN, HLD, GERD, alcohol abuse; admitted on 04/14/2018, presented with complaint of shortness of breath, was found to have acute kidney injury. Currently further plan is supportive measures.  Subjective: Feeling better breathing better no nausea no vomiting.  Assessment and Plan: 1.  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   He did receive IV dye at that point but his renal function was normal.  Hydration is currently on hold.  Renal function improving.  Urine output adequate. Nephrology consulted. Appreciate assistance Currently no indication for dialysis.  2.  Hepatic steatosis:  Etiology is unclear may be related to prior heavy alcohol drinking.   Patient states he has quit drinking.   However his INR is markedly elevated and he has had recent episode of what appeared to be some hepatitis that may have been alcoholic hepatitis. Unsure as the etiology.   Patient was scheduled for an outpatient liver biopsy but currently we will hold off on that.  3.  Chronic A. fib with RVR. On Cardizem and Lopressor at home. It was continued at home regimen but his heart rate remained elevated with hypotension. Started on IV Cardizem drip.  Transition to oral Cardizem. Transfer to stepdown unit. Continue to monitor. Continue Lopressor. Echocardiogram shows preserved EF, no acute abnormality. TSH normal. Do not suspect that the patient actually has ACS. Anticoagulation with Coumadin.  Pharmacy consulted.  INR subtherapeutic.  4.  Hypertension:  Pressures are soft.   Continue aggressive IV hydration.  5.   Gastroesophageal reflux disease: Continue proton pump inhibitor.  6.  Hyperlipidemia:  Continue present care.  7.  Alcohol abuse in remission:  Reportedly has a history of being a volatile and possibly heavy drinker. Continue CIWA protocol.  8. Body mass index is 32.84 kg/m.  Nutrition Problem: Moderate Malnutrition Etiology: chronic illness(hepatic steatosis) Interventions: Interventions: Ensure Enlive (each supplement provides 350kcal and 20 grams of protein), MVI   9.  Leukocytosis. WBC elevated with an upward trend despite IV hydration which should cause dilution. No fevers overnight. Patient reports no other acute new abnormality. Chest x-ray unremarkable for any pneumonia. Does not have any urinary symptoms.  UA unremarkable. We will perform blood cultures.  Procalcitonin minimally elevated.  Lactic acid normal. Currently holding off on any antibiotic initiation and will monitor.   Diet: cardiac diet DVT Prophylaxis: on therapeutic anticoagulation.  Advance goals of care discussion: full code  Family Communication: no family was present at bedside, at the time of interview. Called family per request Opportunity was given to ask question and all questions were answered satisfactorily.   Disposition:  Discharge to home.  Consultants: Nephrology Procedures: Echocardiogram   Scheduled Meds: . diltiazem  240 mg Oral Daily  . feeding supplement (ENSURE ENLIVE)  237 mL Oral BID BM  . folic acid  1 mg Oral Daily  . levothyroxine  50 mcg Oral Q0600  . metoprolol tartrate  50 mg Oral BID  . multivitamin with minerals  1 tablet Oral Daily  . pantoprazole  40 mg Oral Daily  . sodium bicarbonate  650 mg Oral TID  . sodium chloride flush  3 mL Intravenous Q12H  . thiamine  100 mg Oral Daily  . Warfarin - Pharmacist Dosing Inpatient   Does not apply q1800   Continuous Infusions:  PRN Meds: guaiFENesin-dextromethorphan, ondansetron **OR** ondansetron (ZOFRAN) IV,  oxyCODONE, traZODone Antibiotics: Anti-infectives (From admission, onward)   None       Objective: Physical Exam: Vitals:   04/18/18 1659 04/18/18 1700 04/18/18 1744 04/18/18 1900  BP: 109/75 109/75 109/75   Pulse: (!) 104 97 97   Resp: (!) 21   19  Temp: 98.7 F (37.1 C)     TempSrc: Oral     SpO2: 96%     Weight:      Height:        Intake/Output Summary (Last 24 hours) at 04/18/2018 1907 Last data filed at 04/18/2018 1300 Gross per 24 hour  Intake 2349.23 ml  Output 605 ml  Net 1744.23 ml   Filed Weights   04/14/18 2016 04/15/18 0500 04/18/18 0900  Weight: 90.3 kg 89.9 kg 92.3 kg   General: Alert, Awake and Oriented to Time, Place and Person. Appear in mild distress, affect appropriate Eyes: PERRL, Conjunctival redness ENT: Oral Mucosa clear dry. Neck: no JVD, no Abnormal Mass Or lumps Cardiovascular: S1 and S2 Present, aortic systolic  Murmur, Peripheral Pulses Present Respiratory: normal respiratory effort, Bilateral Air entry equal and Decreased, no use of accessory muscle, Clear to Auscultation, no Crackles, no wheezes Abdomen: Bowel Sound present, Soft and no tenderness, no hernia Skin: no redness, no Rash, no induration Extremities: no Pedal edema, no calf tenderness Neurologic: Grossly no focal neuro deficit. Bilaterally Equal motor strength  Data Reviewed: CBC: Recent Labs  Lab 04/14/18 1323 04/15/18 0643 04/16/18 0306 04/17/18 0356 04/18/18 0405  WBC 15.7* 16.7* 19.2* 20.5*  21.2* 22.5*  NEUTROABS 13.0*  --   --  18.2*  --   HGB 13.3 13.8 12.4* 11.7*  11.8* 11.1*  HCT 39.1 41.7 36.0* 35.4*  34.6* 33.5*  MCV 91.1 91.4 91.1 91.7  91.5 92.5  PLT 440* 447* 367 328  341 332   Basic Metabolic Panel: Recent Labs  Lab 04/14/18 1323 04/15/18 0643 04/16/18 0306 04/17/18 0826 04/18/18 0405  NA 134* 136 135 135 136  K 4.6 4.2 3.6 3.6 3.2*  CL 101 100 102 103 103  CO2 15* 23 19* 20* 21*  GLUCOSE 76 83 98 99 103*  BUN 109* 106* 92* 80* 76*    CREATININE 8.46* 8.16* 6.98* 6.17* 5.82*  CALCIUM 7.5* 7.7* 7.2* 7.5* 7.7*  MG  --   --  1.7 2.0  --   PHOS  --   --  5.2* 5.0* 5.0*    Liver Function Tests: Recent Labs  Lab 04/14/18 1323 04/15/18 0643 04/16/18 0306 04/17/18 0826 04/18/18 0405  AST 50* 51*  --  36  --   ALT 40 41  --  30  --   ALKPHOS 71 84  --  69  --   BILITOT 1.7* 2.4*  --  2.6*  --   PROT 8.1 8.6*  --  7.5  --   ALBUMIN 1.8* 2.0* 1.7* 1.6* 1.4*   No results for input(s): LIPASE, AMYLASE in the last 168 hours. Recent Labs  Lab 04/14/18 1323  AMMONIA 12   Coagulation Profile: Recent Labs  Lab 04/14/18 1323 04/15/18 0643 04/16/18 0306 04/17/18 0356 04/18/18 0405  INR 9.16* 1.84 1.60 1.64 1.92   Cardiac Enzymes: Recent Labs  Lab 04/14/18 2110 04/15/18 0034 04/15/18 0643  TROPONINI <  0.03 <0.03 <0.03   BNP (last 3 results) No results for input(s): PROBNP in the last 8760 hours. CBG: No results for input(s): GLUCAP in the last 168 hours. Studies: No results found.   Time spent: 35 minutes  Author: Berle Mull, MD [facility] 04/18/2018 7:07 PM  To reach On-call, see care teams to locate the attending and reach out to them via [email or web address]. If 7PM-7AM, please contact night-coverage If you still have difficulty reaching the attending provider, please page the [facility] (Director on Call) for [facility] on amion for assistance.

## 2018-04-18 NOTE — Progress Notes (Signed)
[facility] Progress Note   Subjective:   Says he feels unchanged today.  Wife present today for 1st time that I've seen.  He now tells me he's had poor po intake for about 8 weeks due to anorexia.  Denies dysgeusia/taste alteration but has no appetite for food.  He is extremely frustrated that he is not 100% better yet and that he is still here.   Objective Vitals:   04/17/18 2108 04/17/18 2339 04/18/18 0541 04/18/18 0737  BP: 99/61 102/63 109/66 106/65  Pulse: (!) 102 95 80 99  Resp: 17 (!) $Remo'27 19 15  'eXekh$ Temp: 98.3 F (36.8 C) 98.5 F (36.9 C) 97.6 F (36.4 C) 98.2 F (36.8 C)  TempSrc: Oral Oral Oral Oral  SpO2: 96% 93% 93% 98%  Weight:      Height:       Physical Exam General: elderly man lying flat in bed in no distress Eyes: conjunctiva injected BL, L eye blind ENT: MMM Heart: RR, irreg Lungs: normal WOB Abdomen: soft, nontender Extremities: no edema Skin: no rashes  Additional Objective Labs: Basic Metabolic Panel: Recent Labs  Lab 04/16/18 0306 04/17/18 0826 04/18/18 0405  NA 135 135 136  K 3.6 3.6 3.2*  CL 102 103 103  CO2 19* 20* 21*  GLUCOSE 98 99 103*  BUN 92* 80* 76*  CREATININE 6.98* 6.17* 5.82*  CALCIUM 7.2* 7.5* 7.7*  PHOS 5.2* 5.0* 5.0*   Liver Function Tests: Recent Labs  Lab 04/14/18 1323 04/15/18 0643 04/16/18 0306 04/17/18 0826 04/18/18 0405  AST 50* 51*  --  36  --   ALT 40 41  --  30  --   ALKPHOS 71 84  --  69  --   BILITOT 1.7* 2.4*  --  2.6*  --   PROT 8.1 8.6*  --  7.5  --   ALBUMIN 1.8* 2.0* 1.7* 1.6* 1.4*   No results for input(s): LIPASE, AMYLASE in the last 168 hours. CBC: Recent Labs  Lab 04/14/18 1323 04/15/18 0643 04/16/18 0306 04/17/18 0356 04/18/18 0405  WBC 15.7* 16.7* 19.2* 20.5*  21.2* 22.5*  NEUTROABS 13.0*  --   --  18.2*  --   HGB 13.3 13.8 12.4* 11.7*  11.8* 11.1*  HCT 39.1 41.7 36.0* 35.4*  34.6* 33.5*  MCV 91.1 91.4 91.1 91.7  91.5 92.5  PLT 440* 447* 367 328  341 331   Blood  Culture    Component Value Date/Time   SDES BLOOD LEFT HAND 04/17/2018 0846   SPECREQUEST  04/17/2018 0846    BOTTLES DRAWN AEROBIC ONLY Blood Culture results may not be optimal due to an inadequate volume of blood received in culture bottles   CULT  04/17/2018 0846    NO GROWTH < 24 HOURS Performed at [facility], [place] [address] [place], [place] [address]    REPTSTATUS PENDING 04/17/2018 0846    Cardiac Enzymes: Recent Labs  Lab 04/14/18 2110 04/15/18 0034 04/15/18 0643  TROPONINI <0.03 <0.03 <0.03   CBG: No results for input(s): GLUCAP in the last 168 hours. Iron Studies: No results for input(s): IRON, TIBC, TRANSFERRIN, FERRITIN in the last 72 hours. $RemoveB'@lablastinr3'ocTegWHk$ @ Studies/Results: Dg Chest Port 1 View  Result Date: 04/17/2018 CLINICAL DATA:  Leukocytosis.  Hypertension. EXAM: PORTABLE CHEST 1 VIEW COMPARISON:  April 06, 2018 FINDINGS: There is scarring along the left base. There are apparent calcified pleural plaques bilaterally. Calcification is also seen along the left pleural surface. There is no edema  or consolidation. Heart is upper normal in size with pulmonary vascularity normal. No adenopathy. There is aortic atherosclerosis. No bone lesions. IMPRESSION: Apparent pleural plaques and left pleural calcification. These are changes likely indicative of previous asbestos exposure. Mild scarring left base. No edema or consolidation evident. Stable cardiac silhouette. Aortic Atherosclerosis (ICD10-I70.0). Electronically Signed   By: Lowella Grip III M.D.   On: 04/17/2018 09:33   Medications:  . diltiazem  240 mg Oral Daily  . feeding supplement (ENSURE ENLIVE)  237 mL Oral BID BM  . folic acid  1 mg Oral Daily  . levothyroxine  50 mcg Oral Q0600  . LORazepam  0-4 mg Intravenous Q12H  . metoprolol tartrate  50 mg Oral BID  . multivitamin with minerals  1 tablet Oral Daily  . pantoprazole  40 mg Oral Daily  . sodium bicarbonate  650 mg Oral TID  . sodium  chloride flush  3 mL Intravenous Q12H  . thiamine  100 mg Oral Daily  . Warfarin - Pharmacist Dosing Inpatient   Does not apply q1800    2/11 CT:  Noting - Nonspecific mild perinephric fat stranding with renovascular calcifications bilaterally. No obstructive uropathy, renal mass or nephrolithiasis. Stable 14 mm cystic focus off the lower pole the left kidney with associated mild renal cortical scarring.  Assessment/Plan: 1.  AKI, severe, nonoliguric:  Baseline normal 03/2017.  Continued slow improvement in renal function today (2/11 8.5 >  2/13 6.98 > 2/14 6.17>2/15 5.82), balanced electrolytes and no indications for dialysis though the poor appetite and weight loss could be a uremic symptom.  I offered a trial of dialysis to see if this symptom improved and he flatly refused.     SPEP with polyclonal gammopathy, UPEP with M spike though IFE pending - I spoke with lab today at 8:45 and they are calling [facility] for the result. .  Check serum free LC.  UP/C 0.28.   ANA neg.  Imaging unrevealing.   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 Hematuria present. I think this is likely the culprit.  I think it's ok to resume warfarin with conservative dosing and careful monitoring.   I proposed watchful waiting for now and he's in agreement.  We discussed this for a while given his level of frustration.  I D/Cd his fluids this AM and he will see if he can maintain oral intake.  If labs continue to improve tomorrow he can go home from perspective and I will f/u with him in clinic in 1-2 weeks.    PTH 133, suggesting this may not be a complete acute event.    2.  Leukocytosis: unclear etiology. Afebrile, no localizing s/s infection.  Hb was normal on presentation, now 22.5, Plt count normal, mild anemia.  I'm interested to f/u the UPEP and SFLC.  Unclear to me what is driving the leukocytosis.  He may need to see a hematologist.     3.  Transaminitis: following with hepatology (Dr. Laural Golden), liver biopsy aborted due to INR 9, though his transaminitis is now resolved.  Inflammatory markers were checked which were elevated and there is a suspicion for autoimmune disease.    4.  Atrial fibrillation:  Per primary.   5.  Metabolic acidosis:  Cont na bicarb 650 TID. Bicarb 21 today.   Jannifer Hick MD 04/18/2018, 8:45 AM  [facility] Pager: [phone]

## 2018-04-18 NOTE — Progress Notes (Signed)
[place] for warfarin Indication: atrial fibrillation  Allergies  Allergen Reactions  . Statins Swelling    Legs hurt   Labs: Recent Labs    04/16/18 0306 04/17/18 0356 04/17/18 0826 04/18/18 0405  HGB 12.4* 11.7*  11.8*  --  11.1*  HCT 36.0* 35.4*  34.6*  --  33.5*  PLT 367 328  341  --  331  LABPROT 18.9* 19.2*  --  21.7*  INR 1.60 1.64  --  1.92  CREATININE 6.98*  --  6.17* 5.82*    Estimated Creatinine Clearance: 11.7 mL/min (A) (by C-G formula based on SCr of 5.82 mg/dL (H)).   Assessment: [name] on warfarin PTA for atrial fibrillation. Dose PTA 3.75 mg on Tues, Thurs, and Saturdays; 2.5 mg on all other days of the week. Was instructed by Dr. Laural Golden to hold warfarin on 2/5 with elevated LFTs, weakness, and poor appetite.  INR on admission was elevated at 9.01 despite holding warfarin. S/p vitamin K 5 mg IV x 1 on 2/11.   INR trend up to 1.92 today (overcoming vitamin K). CBC stable. No active bleed issues documented.  Goal of Therapy:  INR 2-3 Monitor platelets by anticoagulation protocol: Yes   Plan:  Warfarin 3.75 mg po x 1 dose Daily INR and CBC Monitor for bleeding  Elicia Lamp, PharmD, BCPS Please check AMION for all [place] contact numbers Clinical Pharmacist 04/18/2018 12:33 PM

## 2018-04-19 LAB — C DIFFICILE QUICK SCREEN W PCR REFLEX
C DIFFICILE (CDIFF) TOXIN: NEGATIVE
C Diff antigen: POSITIVE — AB

## 2018-04-19 LAB — PROTIME-INR
INR: 2.6
Prothrombin Time: 27.4 seconds — ABNORMAL HIGH (ref 11.4–15.2)

## 2018-04-19 LAB — CBC
HCT: 35.6 % — ABNORMAL LOW (ref 39.0–52.0)
Hemoglobin: 11.8 g/dL — ABNORMAL LOW (ref 13.0–17.0)
MCH: 30.5 pg (ref 26.0–34.0)
MCHC: 33.1 g/dL (ref 30.0–36.0)
MCV: 92 fL (ref 80.0–100.0)
Platelets: 329 10*3/uL (ref 150–400)
RBC: 3.87 MIL/uL — ABNORMAL LOW (ref 4.22–5.81)
RDW: 13.9 % (ref 11.5–15.5)
WBC: 20.9 10*3/uL — ABNORMAL HIGH (ref 4.0–10.5)
nRBC: 0 % (ref 0.0–0.2)

## 2018-04-19 LAB — CLOSTRIDIUM DIFFICILE BY PCR, REFLEXED: Toxigenic C. Difficile by PCR: NEGATIVE

## 2018-04-19 LAB — RENAL FUNCTION PANEL
Albumin: 1.5 g/dL — ABNORMAL LOW (ref 3.5–5.0)
Anion gap: 10 (ref 5–15)
BUN: 76 mg/dL — ABNORMAL HIGH (ref 8–23)
CO2: 22 mmol/L (ref 22–32)
Calcium: 7.5 mg/dL — ABNORMAL LOW (ref 8.9–10.3)
Chloride: 103 mmol/L (ref 98–111)
Creatinine, Ser: 5.82 mg/dL — ABNORMAL HIGH (ref 0.61–1.24)
GFR calc Af Amer: 10 mL/min — ABNORMAL LOW (ref >60)
GFR calc non Af Amer: 9 mL/min — ABNORMAL LOW (ref >60)
Glucose, Bld: 93 mg/dL (ref 70–99)
Phosphorus: 5 mg/dL — ABNORMAL HIGH (ref 2.5–4.6)
Potassium: 3.2 mmol/L — ABNORMAL LOW (ref 3.5–5.1)
Sodium: 135 mmol/L (ref 135–145)

## 2018-04-19 MED ORDER — POTASSIUM CHLORIDE CRYS ER 20 MEQ PO TBCR
40.0000 meq | EXTENDED_RELEASE_TABLET | Freq: Once | ORAL | Status: AC
  Administered 2018-04-19: 40 meq via ORAL
  Filled 2018-04-19: qty 2

## 2018-04-19 MED ORDER — ALBUMIN HUMAN 25 % IV SOLN
12.5000 g | Freq: Once | INTRAVENOUS | Status: DC
  Filled 2018-04-19: qty 50

## 2018-04-19 MED ORDER — DILTIAZEM HCL ER COATED BEADS 180 MG PO CP24
360.0000 mg | ORAL_CAPSULE | Freq: Every day | ORAL | Status: DC
  Administered 2018-04-19 – 2018-04-26 (×7): 360 mg via ORAL
  Filled 2018-04-19 (×9): qty 2

## 2018-04-19 MED ORDER — FAMOTIDINE 20 MG PO TABS
20.0000 mg | ORAL_TABLET | Freq: Every day | ORAL | Status: DC
  Administered 2018-04-19 – 2018-04-26 (×8): 20 mg via ORAL
  Filled 2018-04-19 (×8): qty 1

## 2018-04-19 MED ORDER — LACTATED RINGERS IV SOLN
INTRAVENOUS | Status: DC
  Administered 2018-04-19 – 2018-04-23 (×10): via INTRAVENOUS

## 2018-04-19 MED ORDER — CHOLESTYRAMINE 4 G PO PACK: 4.0000 g | PACK | Freq: Every day | ORAL | Status: DC

## 2018-04-19 MED ORDER — VANCOMYCIN 50 MG/ML ORAL SOLUTION
125.0000 mg | Freq: Four times a day (QID) | ORAL | Status: DC
  Administered 2018-04-19 – 2018-04-26 (×27): 125 mg via ORAL
  Filled 2018-04-19 (×31): qty 2.5

## 2018-04-19 MED ORDER — SACCHAROMYCES BOULARDII 250 MG PO CAPS
250.0000 mg | ORAL_CAPSULE | Freq: Two times a day (BID) | ORAL | Status: DC
  Administered 2018-04-19 – 2018-04-26 (×14): 250 mg via ORAL
  Filled 2018-04-19 (×14): qty 1

## 2018-04-19 NOTE — Progress Notes (Signed)
[facility] Progress Note  Patient: Daniel Morse NTI:[id number]   PCP: Sharilyn Sites, MD DOB: 27-Nov-1940   DOA: 04/14/2018   DOS: 04/19/2018   Date of Service: the patient was seen and examined on 04/19/2018  Brief hospital course: Pt. with PMH of chronic A. fib, HTN, HLD, GERD, alcohol abuse; admitted on 04/14/2018, presented with complaint of shortness of breath, was found to have acute kidney injury. Currently further plan is supportive measures.  Subjective: Early in the morning yesterday patient reported 1 episode of loose BM. Reports that he had 6 episodes of loose BM last night. He also reports that he had 2 episodes of loose BM this morning. Reports nausea without any vomiting. Also has some abdominal discomfort. No fever no chills.  Able to tolerate oral diet.  Breathing is okay. He is frustrated with the slow pace of his improvement.  Assessment and Plan: 1.  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   He did receive IV dye at that point but his renal function was normal.  Hydration is currently on hold.  Renal function improving.  Urine output adequate. Nephrology consulted. Appreciate assistance Currently no indication for dialysis. Currently with diarrhea patient will be receiving IV hydration.  Will monitor.  2.  Hepatic steatosis:  Etiology is unclear may be related to prior heavy alcohol drinking.   Patient states he has quit drinking.   However his INR is markedly elevated and he has had recent episode of what appeared to be some hepatitis that may have been alcoholic hepatitis. Unsure as the etiology.   Patient was scheduled for an outpatient liver biopsy but currently we will hold off on that.  3.  Chronic A. fib with RVR. On Cardizem and Lopressor at home. It was continued at home regimen but his heart rate remained elevated with  hypotension. Echocardiogram shows preserved EF, no acute abnormality. TSH normal. Do not suspect that the patient actually has ACS. Anticoagulation with Coumadin.  Pharmacy consulted.  INR therapeutic.  Coumadin currently on hold for possible renal biopsy. Started on IV Cardizem drip.  Transition to oral Cardizem. Continue Lopressor. Again on RVR on 04/19/2018 likely secondary to dehydration from diarrhea.  Replacing potassium replacing magnesium.  Cardizem dose increased.  Monitor.  4.  Hypertension:  Pressures are soft.   Continue aggressive IV hydration.  5.  Gastroesophageal reflux disease:  With concern for diarrhea will discontinue PPI and switch to Pepcid.  6.  Hyperlipidemia:  Continue present care.  7.  Alcohol abuse in remission:  Reportedly has a history of being a volatile and possibly heavy drinker. Continue CIWA protocol.  8. Body mass index is 32.73 kg/m.  Nutrition Problem: Moderate Malnutrition Etiology: chronic illness(hepatic steatosis) Interventions: Interventions: Ensure Enlive (each supplement provides 350kcal and 20 grams of protein), MVI   9.  Leukocytosis. WBC elevated with an upward trend despite IV hydration which should cause dilution. No fevers overnight. Chest x-ray unremarkable for any pneumonia. Does not have any urinary symptoms.  UA unremarkable. So far no growth on blood cultures.  Procalcitonin minimally elevated.  Lactic acid normal. Currently holding off on any antibiotic initiation and will monitor.  10.  Diarrhea. Patient reports multiple loose BM last night as well as this morning. Reports some nausea. Also reports some mild abdominal discomfort. Still able to tolerate oral diet. Patient is not on any stool softener in last  72 hours. He thinks that he is having diarrhea secondary to taking other medicines on empty stomach. With worsening leukocytosis as mentioned above without any actual etiology for that, C. difficile PCR was  ordered which is indeterminate with positive antigen but negative toxin EIA as well as toxigenic PCR. Due to significant symptom burden as well as leukocytosis will go ahead and treat him with C. difficile protocol oral vancomycin.  Diet: cardiac diet DVT Prophylaxis: on therapeutic anticoagulation.  Advance goals of care discussion: full code  Family Communication: no family was present at bedside, at the time of interview. Called family per request Opportunity was given to ask question and all questions were answered satisfactorily.   Disposition:  Discharge to home.  Consultants: Nephrology Procedures: Echocardiogram   Scheduled Meds: . cholestyramine  4 g Oral Daily  . diltiazem  360 mg Oral Daily  . feeding supplement (ENSURE ENLIVE)  237 mL Oral BID BM  . folic acid  1 mg Oral Daily  . levothyroxine  50 mcg Oral Q0600  . metoprolol tartrate  50 mg Oral BID  . multivitamin with minerals  1 tablet Oral Daily  . saccharomyces boulardii  250 mg Oral BID  . sodium bicarbonate  650 mg Oral TID  . sodium chloride flush  3 mL Intravenous Q12H  . thiamine  100 mg Oral Daily   Continuous Infusions: . lactated ringers 100 mL/hr at 04/19/18 1527   PRN Meds: guaiFENesin-dextromethorphan, ondansetron **OR** ondansetron (ZOFRAN) IV, oxyCODONE, traZODone Antibiotics: Anti-infectives (From admission, onward)   None       Objective: Physical Exam: Vitals:   04/19/18 0401 04/19/18 0427 04/19/18 0737 04/19/18 1203  BP: 115/77  111/77 113/78  Pulse: (!) 109  (!) 105 92  Resp: 19  (!) 23 16  Temp:  98.5 F (36.9 C) 98.7 F (37.1 C) 98.2 F (36.8 C)  TempSrc:  Axillary Oral Oral  SpO2: 98%  99% 96%  Weight: 92 kg     Height:        Intake/Output Summary (Last 24 hours) at 04/19/2018 1616 Last data filed at 04/19/2018 1527 Gross per 24 hour  Intake 838.11 ml  Output 500 ml  Net 338.11 ml   Filed Weights   04/15/18 0500 04/18/18 0900 04/19/18 0401  Weight: 89.9 kg 92.3 kg  92 kg   General: Alert, Awake and Oriented to Time, Place and Person. Appear in mild distress, affect appropriate Eyes: PERRL, Conjunctival redness ENT: Oral Mucosa clear dry. Neck: no JVD, no Abnormal Mass Or lumps Cardiovascular: S1 and S2 Present, aortic systolic  Murmur, Peripheral Pulses Present Respiratory: normal respiratory effort, Bilateral Air entry equal and Decreased, no use of accessory muscle, Clear to Auscultation, no Crackles, no wheezes Abdomen: Bowel Sound present, Soft and mild tenderness, no hernia Skin: no redness, no Rash, no induration Extremities: no Pedal edema, no calf tenderness Neurologic: Grossly no focal neuro deficit. Bilaterally Equal motor strength  Data Reviewed: CBC: Recent Labs  Lab 04/14/18 1323 04/15/18 0643 04/16/18 0306 04/17/18 0356 04/18/18 0405 04/19/18 0336  WBC 15.7* 16.7* 19.2* 20.5*  21.2* 22.5* 20.9*  NEUTROABS 13.0*  --   --  18.2*  --   --   HGB 13.3 13.8 12.4* 11.7*  11.8* 11.1* 11.8*  HCT 39.1 41.7 36.0* 35.4*  34.6* 33.5* 35.6*  MCV 91.1 91.4 91.1 91.7  91.5 92.5 92.0  PLT 440* 447* 367 328  341 331 588   Basic Metabolic Panel: Recent Labs  Lab 04/15/18 [phone]  04/16/18 0306 04/17/18 0826 04/18/18 0405 04/19/18 0336  NA 136 135 135 136 135  K 4.2 3.6 3.6 3.2* 3.2*  CL 100 102 103 103 103  CO2 23 19* 20* 21* 22  GLUCOSE 83 98 99 103* 93  BUN 106* 92* 80* 76* 76*  CREATININE 8.16* 6.98* 6.17* 5.82* 5.82*  CALCIUM 7.7* 7.2* 7.5* 7.7* 7.5*  MG  --  1.7 2.0  --   --   PHOS  --  5.2* 5.0* 5.0* 5.0*    Liver Function Tests: Recent Labs  Lab 04/14/18 1323 04/15/18 0643 04/16/18 0306 04/17/18 0826 04/18/18 0405 04/19/18 0336  AST 50* 51*  --  36  --   --   ALT 40 41  --  30  --   --   ALKPHOS 71 84  --  69  --   --   BILITOT 1.7* 2.4*  --  2.6*  --   --   PROT 8.1 8.6*  --  7.5  --   --   ALBUMIN 1.8* 2.0* 1.7* 1.6* 1.4* 1.5*   No results for input(s): LIPASE, AMYLASE in the last 168 hours. Recent Labs    Lab 04/14/18 1323  AMMONIA 12   Coagulation Profile: Recent Labs  Lab 04/15/18 0643 04/16/18 0306 04/17/18 0356 04/18/18 0405 04/19/18 0336  INR 1.84 1.60 1.64 1.92 2.60   Cardiac Enzymes: Recent Labs  Lab 04/14/18 2110 04/15/18 0034 04/15/18 0643  TROPONINI <0.03 <0.03 <0.03   BNP (last 3 results) No results for input(s): PROBNP in the last 8760 hours. CBG: No results for input(s): GLUCAP in the last 168 hours. Studies: No results found.   Time spent: 35 minutes  Author: Berle Mull, MD [facility] 04/19/2018 4:16 PM  To reach On-call, see care teams to locate the attending and reach out to them via [email or web address]. If 7PM-7AM, please contact night-coverage If you still have difficulty reaching the attending provider, please page the [facility] (Director on Call) for [facility] on amion for assistance.

## 2018-04-19 NOTE — Plan of Care (Signed)
  Problem: Education: Goal: Knowledge of General Education information will improve Description Including pain rating scale, medication(s)/side effects and non-pharmacologic comfort measures 04/19/2018 2055 by Drenda Freeze, RN Outcome: Progressing 04/19/2018 2054 by Drenda Freeze, RN Outcome: Progressing   Problem: Health Behavior/Discharge Planning: Goal: Ability to manage health-related needs will improve Outcome: Progressing

## 2018-04-19 NOTE — Plan of Care (Signed)
  Problem: Education: Goal: Knowledge of General Education information will improve Description Including pain rating scale, medication(s)/side effects and non-pharmacologic comfort measures Outcome: Progressing   

## 2018-04-19 NOTE — Progress Notes (Signed)
[facility] Progress Note   Subjective:   Diarrhea overnight x 6, nonbloody.  C diff pending. Continued frustration about being in hospital.  Had cough prior to admission, CXR clear, denies hemoptysis or tea colored urine at any point.   Objective Vitals:   04/19/18 0359 04/19/18 0401 04/19/18 0427 04/19/18 0737  BP: 115/77 115/77  111/77  Pulse: (!) 108 (!) 109  (!) 105  Resp: (!) 31 19  (!) 23  Temp: (!) 97.4 F (36.3 C)  98.5 F (36.9 C) 98.7 F (37.1 C)  TempSrc: Oral  Axillary Oral  SpO2: 95% 98%  99%  Weight:  92 kg    Height:       Physical Exam General: elderly man lying flat in bed in no distress Eyes: conjunctiva injected BL, L eye blind ENT: MMM Heart: RR, irreg Lungs: normal WOB Abdomen: soft, nontender Extremities: no edema Skin: no rashes  Additional Objective Labs: Basic Metabolic Panel: Recent Labs  Lab 04/17/18 0826 04/18/18 0405 04/19/18 0336  NA 135 136 135  K 3.6 3.2* 3.2*  CL 103 103 103  CO2 20* 21* 22  GLUCOSE 99 103* 93  BUN 80* 76* 76*  CREATININE 6.17* 5.82* 5.82*  CALCIUM 7.5* 7.7* 7.5*  PHOS 5.0* 5.0* 5.0*   Liver Function Tests: Recent Labs  Lab 04/14/18 1323 04/15/18 0643  04/17/18 0826 04/18/18 0405 04/19/18 0336  AST 50* 51*  --  36  --   --   ALT 40 41  --  30  --   --   ALKPHOS 71 84  --  69  --   --   BILITOT 1.7* 2.4*  --  2.6*  --   --   PROT 8.1 8.6*  --  7.5  --   --   ALBUMIN 1.8* 2.0*   < > 1.6* 1.4* 1.5*   < > = values in this interval not displayed.   No results for input(s): LIPASE, AMYLASE in the last 168 hours. CBC: Recent Labs  Lab 04/14/18 1323 04/15/18 0643 04/16/18 0306 04/17/18 0356 04/18/18 0405 04/19/18 0336  WBC 15.7* 16.7* 19.2* 20.5*  21.2* 22.5* 20.9*  NEUTROABS 13.0*  --   --  18.2*  --   --   HGB 13.3 13.8 12.4* 11.7*  11.8* 11.1* 11.8*  HCT 39.1 41.7 36.0* 35.4*  34.6* 33.5* 35.6*  MCV 91.1 91.4 91.1 91.7  91.5 92.5 92.0  PLT 440* 447* 367 328  341 331 329    Blood Culture    Component Value Date/Time   SDES BLOOD LEFT HAND 04/17/2018 0846   SPECREQUEST  04/17/2018 0846    BOTTLES DRAWN AEROBIC ONLY Blood Culture results may not be optimal due to an inadequate volume of blood received in culture bottles   CULT  04/17/2018 0846    NO GROWTH 2 DAYS Performed at [facility], [place] [address] [place], [place] [address]    REPTSTATUS PENDING 04/17/2018 0846    Cardiac Enzymes: Recent Labs  Lab 04/14/18 2110 04/15/18 0034 04/15/18 0643  TROPONINI <0.03 <0.03 <0.03   CBG: No results for input(s): GLUCAP in the last 168 hours. Iron Studies: No results for input(s): IRON, TIBC, TRANSFERRIN, FERRITIN in the last 72 hours. $RemoveB'@lablastinr3'ntBytVze$ @ Studies/Results: Dg Chest Port 1 View  Result Date: 04/17/2018 CLINICAL DATA:  Leukocytosis.  Hypertension. EXAM: PORTABLE CHEST 1 VIEW COMPARISON:  April 06, 2018 FINDINGS: There is scarring along the left base. There are apparent calcified pleural plaques  bilaterally. Calcification is also seen along the left pleural surface. There is no edema or consolidation. Heart is upper normal in size with pulmonary vascularity normal. No adenopathy. There is aortic atherosclerosis. No bone lesions. IMPRESSION: Apparent pleural plaques and left pleural calcification. These are changes likely indicative of previous asbestos exposure. Mild scarring left base. No edema or consolidation evident. Stable cardiac silhouette. Aortic Atherosclerosis (ICD10-I70.0). Electronically Signed   By: Lowella Grip III M.D.   On: 04/17/2018 09:33   Medications: . albumin human     . diltiazem  360 mg Oral Daily  . feeding supplement (ENSURE ENLIVE)  237 mL Oral BID BM  . folic acid  1 mg Oral Daily  . levothyroxine  50 mcg Oral Q0600  . metoprolol tartrate  50 mg Oral BID  . multivitamin with minerals  1 tablet Oral Daily  . pantoprazole  40 mg Oral Daily  . sodium bicarbonate  650 mg Oral TID  . sodium chloride flush  3  mL Intravenous Q12H  . thiamine  100 mg Oral Daily  . Warfarin - Pharmacist Dosing Inpatient   Does not apply q1800    2/11 CT:  Noting - Nonspecific mild perinephric fat stranding with renovascular calcifications bilaterally. No obstructive uropathy, renal mass or nephrolithiasis. Stable 14 mm cystic focus off the lower pole the left kidney with associated mild renal cortical scarring.  Assessment/Plan: 1.  AKI, severe, nonoliguric:  Baseline normal 03/2017, we don't have interval labs.  Was slowly improving but now has stagnated (2/11 8.5 >  2/13 6.98 > 2/14 6.17>2/15 5.82 > 2/16 5.82).  Hematuria present, minor proteinuria with UP/C 0.28.  In light of diarrhea resume IVF today.  He presented with INR 9 and renal function was initially improving with supportive care, making working Dx warfarin induced nephropathy. In light of failed improvement can offer a renal biopsy.  He flatly refuses.  I discussed with him if renal function worsens and he were to develop need for dialysis, which he also flatly refuses, he would need to do hospice care.  He is unwilling to participate in that conversation.    SPEP with polyclonal gammopathy, UPEP with M spike though IFE pending - called yesterday and will be back "next week".  Serum free LC pending.  UP/C 0.28.   ANA neg.  Imaging unrevealing.  Will check further serologic eval including C3, C4, ANCA, antiGBM.  Per report (GI) hepatitis serologies are negative.  PTH 133, suggesting this may not be a completely acute event.    2.  Leukocytosis: unclear etiology. Afebrile, no localizing s/s infection.  Hb was normal on presentation, now 11.8, Plt count normal.  I'm interested to f/u the UPEP and SFLC.  Unclear to me what is driving the leukocytosis.  He may need to see a hematologist.    3.  Transaminitis: following with hepatology (Dr. Laural Golden), liver biopsy aborted due to INR 9, though his transaminitis is now resolved.  Inflammatory markers were checked  which were elevated and there is a suspicion for autoimmune disease.    4.  Atrial fibrillation:  Per primary.   5.  Metabolic acidosis:  Cont na bicarb 650 TID. Bicarb 22 today.   Jannifer Hick MD 04/19/2018, 8:29 AM  [facility] Pager: [phone]

## 2018-04-20 ENCOUNTER — Telehealth (INDEPENDENT_AMBULATORY_CARE_PROVIDER_SITE_OTHER): Payer: Self-pay | Admitting: Internal Medicine

## 2018-04-20 LAB — RENAL FUNCTION PANEL
Albumin: 1.5 g/dL — ABNORMAL LOW (ref 3.5–5.0)
Anion gap: 9 (ref 5–15)
BUN: 72 mg/dL — AB (ref 8–23)
CO2: 22 mmol/L (ref 22–32)
Calcium: 7.5 mg/dL — ABNORMAL LOW (ref 8.9–10.3)
Chloride: 103 mmol/L (ref 98–111)
Creatinine, Ser: 5.37 mg/dL — ABNORMAL HIGH (ref 0.61–1.24)
GFR calc Af Amer: 11 mL/min — ABNORMAL LOW (ref >60)
GFR calc non Af Amer: 10 mL/min — ABNORMAL LOW (ref >60)
Glucose, Bld: 79 mg/dL (ref 70–99)
Phosphorus: 4.4 mg/dL (ref 2.5–4.6)
Potassium: 3.4 mmol/L — ABNORMAL LOW (ref 3.5–5.1)
Sodium: 134 mmol/L — ABNORMAL LOW (ref 135–145)

## 2018-04-20 LAB — CBC
HCT: 34.6 % — ABNORMAL LOW (ref 39.0–52.0)
Hemoglobin: 11.8 g/dL — ABNORMAL LOW (ref 13.0–17.0)
MCH: 31.3 pg (ref 26.0–34.0)
MCHC: 34.1 g/dL (ref 30.0–36.0)
MCV: 91.8 fL (ref 80.0–100.0)
Platelets: 342 10*3/uL (ref 150–400)
RBC: 3.77 MIL/uL — ABNORMAL LOW (ref 4.22–5.81)
RDW: 14.1 % (ref 11.5–15.5)
WBC: 19.3 10*3/uL — ABNORMAL HIGH (ref 4.0–10.5)
nRBC: 0 % (ref 0.0–0.2)

## 2018-04-20 LAB — KAPPA/LAMBDA LIGHT CHAINS
Kappa free light chain: 449.7 mg/L — ABNORMAL HIGH (ref 3.3–19.4)
Kappa, lambda light chain ratio: 2.31 — ABNORMAL HIGH (ref 0.26–1.65)
Lambda free light chains: 195 mg/L — ABNORMAL HIGH (ref 5.7–26.3)

## 2018-04-20 MED ORDER — POTASSIUM CHLORIDE CRYS ER 20 MEQ PO TBCR
40.0000 meq | EXTENDED_RELEASE_TABLET | Freq: Once | ORAL | Status: AC
  Administered 2018-04-20: 40 meq via ORAL
  Filled 2018-04-20: qty 2

## 2018-04-20 MED ORDER — METOPROLOL TARTRATE 25 MG PO TABS
25.0000 mg | ORAL_TABLET | Freq: Once | ORAL | Status: AC
  Administered 2018-04-20: 25 mg via ORAL
  Filled 2018-04-20: qty 1

## 2018-04-20 MED ORDER — METOPROLOL TARTRATE 50 MG PO TABS
75.0000 mg | ORAL_TABLET | Freq: Two times a day (BID) | ORAL | Status: DC
  Administered 2018-04-20 – 2018-04-26 (×11): 75 mg via ORAL
  Filled 2018-04-20 (×12): qty 1

## 2018-04-20 NOTE — Telephone Encounter (Signed)
Wife called stated patient is at [facility] and the Dr's are not communicating with them - they are wanting to put him on dialysis - wife would like to talk to you - please call her at [phone]

## 2018-04-20 NOTE — Progress Notes (Signed)
Patient in bathroom trying to have B.M. H.R. going up 120-140 At. Fib. H.R. came back down once back to bed.

## 2018-04-20 NOTE — Progress Notes (Signed)
[facility] Progress Note   Subjective:     Ongoing diarrhea, CDI testing equivocal, will treat  ON IVFs  SCr decreased slightly  UOP > 0.5L  C3, C4, ANCA, GBM Ab pending  ANA neg; SPEP w/o M Spike  UP/C 0.25  Objective Vitals:   04/20/18 0334 04/20/18 0500 04/20/18 0755 04/20/18 0958  BP: 108/77  103/77 123/77  Pulse: 72  (!) 109 (!) 118  Resp: (!) 21  18   Temp: 97.8 F (36.6 C)  97.6 F (36.4 C)   TempSrc: Oral  Oral   SpO2: 98%  94%   Weight:  92.4 kg    Height:       Physical Exam General: elderly man lying in bed in no distress Eyes: conjunctiva injected BL, L eye blind ENT: MMM Heart: tachy, irreg Lungs: normal WOB Abdomen: soft, nontender Extremities: no edema Skin: no rashes  Additional Objective Labs: Basic Metabolic Panel: Recent Labs  Lab 04/18/18 0405 04/19/18 0336 04/20/18 0325  NA 136 135 134*  K 3.2* 3.2* 3.4*  CL 103 103 103  CO2 21* 22 22  GLUCOSE 103* 93 79  BUN 76* 76* 72*  CREATININE 5.82* 5.82* 5.37*  CALCIUM 7.7* 7.5* 7.5*  PHOS 5.0* 5.0* 4.4   Liver Function Tests: Recent Labs  Lab 04/14/18 1323 04/15/18 0643  04/17/18 0826 04/18/18 0405 04/19/18 0336 04/20/18 0325  AST 50* 51*  --  36  --   --   --   ALT 40 41  --  30  --   --   --   ALKPHOS 71 84  --  69  --   --   --   BILITOT 1.7* 2.4*  --  2.6*  --   --   --   PROT 8.1 8.6*  --  7.5  --   --   --   ALBUMIN 1.8* 2.0*   < > 1.6* 1.4* 1.5* 1.5*   < > = values in this interval not displayed.   No results for input(s): LIPASE, AMYLASE in the last 168 hours. CBC: Recent Labs  Lab 04/14/18 1323  04/16/18 0306 04/17/18 0356 04/18/18 0405 04/19/18 0336 04/20/18 0325  WBC 15.7*   < > 19.2* 20.5*  21.2* 22.5* 20.9* 19.3*  NEUTROABS 13.0*  --   --  18.2*  --   --   --   HGB 13.3   < > 12.4* 11.7*  11.8* 11.1* 11.8* 11.8*  HCT 39.1   < > 36.0* 35.4*  34.6* 33.5* 35.6* 34.6*  MCV 91.1   < > 91.1 91.7  91.5 92.5 92.0 91.8  PLT 440*   < >  367 328  341 331 329 342   < > = values in this interval not displayed.   Blood Culture    Component Value Date/Time   SDES BLOOD LEFT HAND 04/17/2018 0846   SPECREQUEST  04/17/2018 0846    BOTTLES DRAWN AEROBIC ONLY Blood Culture results may not be optimal due to an inadequate volume of blood received in culture bottles   CULT  04/17/2018 0846    NO GROWTH 2 DAYS Performed at [facility], [place] [address] [place], [place] [address]    REPTSTATUS PENDING 04/17/2018 0846    Cardiac Enzymes: Recent Labs  Lab 04/14/18 2110 04/15/18 0034 04/15/18 0643  TROPONINI <0.03 <0.03 <0.03   CBG: No results for input(s): GLUCAP in the last 168 hours. Iron Studies: No results for input(s):  IRON, TIBC, TRANSFERRIN, FERRITIN in the last 72 hours. $RemoveB'@lablastinr3'SjVPiQNP$ @ Studies/Results: No results found. Medications: . lactated ringers 100 mL/hr at 04/20/18 0957   . diltiazem  360 mg Oral Daily  . famotidine  20 mg Oral Daily  . feeding supplement (ENSURE ENLIVE)  237 mL Oral BID BM  . folic acid  1 mg Oral Daily  . levothyroxine  50 mcg Oral Q0600  . metoprolol tartrate  50 mg Oral BID  . multivitamin with minerals  1 tablet Oral Daily  . saccharomyces boulardii  250 mg Oral BID  . sodium bicarbonate  650 mg Oral TID  . sodium chloride flush  3 mL Intravenous Q12H  . thiamine  100 mg Oral Daily  . vancomycin  125 mg Oral QID    2/11 CT:  Noting - Nonspecific mild perinephric fat stranding with renovascular calcifications bilaterally. No obstructive uropathy, renal mass or nephrolithiasis. Stable 14 mm cystic focus off the lower pole the left kidney with associated mild renal cortical scarring.  Assessment/Plan: 1.  AKI, severe, nonoliguric:  Baseline normal 03/2017, we don't have interval labs.  Maybe some improvement in past 24h? Microscopic Hematuria present, minor proteinuria with UP/C 0.28.    He presented with INR 9 and renal function was initially improving with supportive  care, making working Dx warfarin induced nephropathy. Pt has not been willing to discuss renal biopsy or dialysis to date.    SPEP with polyclonal gammopathy, UPEP with M spike though IFE pending - called yesterday and will be back "next week".  Serum free LC pending.  UP/C 0.28.   ANA neg.  Imaging unrevealing.  Further serologies pending.  Per report (GI) hepatitis serologies are negative. Large globulin gap, I suspect might have light chain monoclonal process.   PTH 133, suggesting this may not be a completely acute event.    2.  Leukocytosis: unclear etiology. Afebrile, no localizing s/s infection.  Hb was normal on presentation, now 11.8, Plt count normal.  I'm interested to f/u the UPEP and SFLC.  Unclear to me what is driving the leukocytosis.  He may need to see a hematologist.  As above  3.  Transaminitis: following with hepatology (Dr. Laural Golden), liver biopsy aborted due to INR 9, though his transaminitis is now resolved.  Inflammatory markers were checked which were elevated and there is a suspicion for autoimmune disease.    4.  Atrial fibrillation:  Per primary.   5.  Metabolic acidosis:  Cont na bicarb 650 TID. Bicarb >20 today.   Pearson Grippe MD

## 2018-04-20 NOTE — Progress Notes (Signed)
[facility] Progress Note  Patient: Daniel Morse PPI:[id number]   PCP: Sharilyn Sites, MD DOB: [birth year]   DOA: 04/14/2018   DOS: 04/20/2018   Date of Service: the patient was seen and examined on 04/20/2018  Brief hospital course: Pt. with PMH of chronic A. fib, HTN, HLD, GERD, alcohol abuse; admitted on 04/14/2018, presented with complaint of shortness of breath, was found to have acute kidney injury. Currently further plan is supportive measures.  Subjective: continues to have diarrhea, nausea. No chest pain, still in RVR  Assessment and Plan: 1.  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   He did receive IV dye at that point but his renal function was normal.  Hydration is currently on hold.  Renal function improving.  Urine output adequate. Nephrology consulted. Appreciate assistance Currently no indication for dialysis. Currently with diarrhea patient will be receiving IV hydration.  Will monitor.  2.  Hepatic steatosis:  Etiology is unclear may be related to prior heavy alcohol drinking.   Patient states he has quit drinking.   However his INR is markedly elevated and he has had recent episode of what appeared to be some hepatitis that may have been alcoholic hepatitis. Unsure as the etiology.   Patient was scheduled for an outpatient liver biopsy but currently we will hold off on that.  3.  Chronic A. fib with RVR. On Cardizem and Lopressor at home. It was continued at home regimen but his heart rate remained elevated with hypotension. Echocardiogram shows preserved EF, no acute abnormality. TSH normal. Do not suspect that the patient actually has ACS. Anticoagulation with Coumadin.  Pharmacy consulted.  INR therapeutic.  Coumadin currently on hold for possible renal biopsy. Started on IV Cardizem drip.  Transition to oral Cardizem. Continue  Lopressor. Again on RVR on likely secondary to dehydration from diarrhea.  Replacing potassium replacing magnesium.  Cardizem dose increased.  Lopressor dose increased.  4.  Hypertension:  Pressures are soft.   Continue aggressive IV hydration.  5.  Gastroesophageal reflux disease:  With concern for diarrhea will discontinue PPI and switch to Pepcid.  6.  Hyperlipidemia:  Continue present care.  7.  Alcohol abuse in remission:  Reportedly has a history of being a volatile and possibly heavy drinker. Continue CIWA protocol.  8. Body mass index is 32.88 kg/m.  Nutrition Problem: Moderate Malnutrition Etiology: chronic illness(hepatic steatosis) Interventions: Interventions: Ensure Enlive (each supplement provides 350kcal and 20 grams of protein), MVI   9.  Leukocytosis. WBC elevated with an upward trend despite IV hydration which should cause dilution. No fevers overnight. Chest x-ray unremarkable for any pneumonia. Does not have any urinary symptoms.  UA unremarkable. So far no growth on blood cultures.  Procalcitonin minimally elevated.  Lactic acid normal. Currently holding off on any antibiotic initiation and will monitor.  10.  Diarrhea. Patient reports multiple loose BM last night as well as this morning. Reports some nausea. Also reports some mild abdominal discomfort. Still able to tolerate oral diet. Patient is not on any stool softener in last 72 hours. He thinks that he is having diarrhea secondary to taking other medicines on empty stomach. With worsening leukocytosis as mentioned above without any actual etiology for that, C. difficile PCR was ordered which is indeterminate with positive antigen but negative toxin EIA as well as toxigenic PCR. Due to significant symptom burden as well as leukocytosis  will go ahead and treat him with C. difficile protocol oral vancomycin.  Diet: cardiac diet DVT Prophylaxis: on therapeutic anticoagulation.  Advance goals of  care discussion: full code  Family Communication: no family was present at bedside, at the time of interview. Called family per request Opportunity was given to ask question and all questions were answered satisfactorily.   Disposition:  Discharge to home.  Consultants: Nephrology Procedures: Echocardiogram   Scheduled Meds: . diltiazem  360 mg Oral Daily  . famotidine  20 mg Oral Daily  . feeding supplement (ENSURE ENLIVE)  237 mL Oral BID BM  . folic acid  1 mg Oral Daily  . levothyroxine  50 mcg Oral Q0600  . metoprolol tartrate  75 mg Oral BID  . multivitamin with minerals  1 tablet Oral Daily  . saccharomyces boulardii  250 mg Oral BID  . sodium bicarbonate  650 mg Oral TID  . sodium chloride flush  3 mL Intravenous Q12H  . thiamine  100 mg Oral Daily  . vancomycin  125 mg Oral QID   Continuous Infusions: . lactated ringers 100 mL/hr at 04/20/18 1736   PRN Meds: guaiFENesin-dextromethorphan, ondansetron **OR** ondansetron (ZOFRAN) IV, oxyCODONE, traZODone Antibiotics: Anti-infectives (From admission, onward)   Start     Dose/Rate Route Frequency Ordered Stop   04/19/18 1800  vancomycin (VANCOCIN) 50 mg/mL oral solution 125 mg     125 mg Oral 4 times daily 04/19/18 1633 04/29/18 1759       Objective: Physical Exam: Vitals:   04/20/18 1200 04/20/18 1256 04/20/18 1537 04/20/18 2014  BP: 116/78 108/64 102/76 117/78  Pulse: 100 88 (!) 105 92  Resp:   (!) 23 15  Temp:   98.5 F (36.9 C) 98.4 F (36.9 C)  TempSrc:   Oral Oral  SpO2:   96% 96%  Weight:      Height:        Intake/Output Summary (Last 24 hours) at 04/20/2018 2056 Last data filed at 04/20/2018 2018 Gross per 24 hour  Intake 2266.18 ml  Output 800 ml  Net 1466.18 ml   Filed Weights   04/18/18 0900 04/19/18 0401 04/20/18 0500  Weight: 92.3 kg 92 kg 92.4 kg   General: Alert, Awake and Oriented to Time, Place and Person. Appear in mild distress, affect appropriate Eyes: PERRL, Conjunctival  redness ENT: Oral Mucosa clear dry. Neck: no JVD, no Abnormal Mass Or lumps Cardiovascular: S1 and S2 Present, aortic systolic  Murmur, Peripheral Pulses Present Respiratory: normal respiratory effort, Bilateral Air entry equal and Decreased, no use of accessory muscle, Clear to Auscultation, no Crackles, no wheezes Abdomen: Bowel Sound present, Soft and mild tenderness, no hernia Skin: no redness, no Rash, no induration Extremities: no Pedal edema, no calf tenderness Neurologic: Grossly no focal neuro deficit. Bilaterally Equal motor strength  Data Reviewed: CBC: Recent Labs  Lab 04/14/18 1323  04/16/18 0306 04/17/18 0356 04/18/18 0405 04/19/18 0336 04/20/18 0325  WBC 15.7*   < > 19.2* 20.5*  21.2* 22.5* 20.9* 19.3*  NEUTROABS 13.0*  --   --  18.2*  --   --   --   HGB 13.3   < > 12.4* 11.7*  11.8* 11.1* 11.8* 11.8*  HCT 39.1   < > 36.0* 35.4*  34.6* 33.5* 35.6* 34.6*  MCV 91.1   < > 91.1 91.7  91.5 92.5 92.0 91.8  PLT 440*   < > 367 328  341 331 329 342   < > = values  in this interval not displayed.   Basic Metabolic Panel: Recent Labs  Lab 04/16/18 0306 04/17/18 0826 04/18/18 0405 04/19/18 0336 04/20/18 0325  NA 135 135 136 135 134*  K 3.6 3.6 3.2* 3.2* 3.4*  CL 102 103 103 103 103  CO2 19* 20* 21* 22 22  GLUCOSE 98 99 103* 93 79  BUN 92* 80* 76* 76* 72*  CREATININE 6.98* 6.17* 5.82* 5.82* 5.37*  CALCIUM 7.2* 7.5* 7.7* 7.5* 7.5*  MG 1.7 2.0  --   --   --   PHOS 5.2* 5.0* 5.0* 5.0* 4.4    Liver Function Tests: Recent Labs  Lab 04/14/18 1323 04/15/18 0643 04/16/18 0306 04/17/18 0826 04/18/18 0405 04/19/18 0336 04/20/18 0325  AST 50* 51*  --  36  --   --   --   ALT 40 41  --  30  --   --   --   ALKPHOS 71 84  --  69  --   --   --   BILITOT 1.7* 2.4*  --  2.6*  --   --   --   PROT 8.1 8.6*  --  7.5  --   --   --   ALBUMIN 1.8* 2.0* 1.7* 1.6* 1.4* 1.5* 1.5*   No results for input(s): LIPASE, AMYLASE in the last 168 hours. Recent Labs  Lab  04/14/18 1323  AMMONIA 12   Coagulation Profile: Recent Labs  Lab 04/15/18 0643 04/16/18 0306 04/17/18 0356 04/18/18 0405 04/19/18 0336  INR 1.84 1.60 1.64 1.92 2.60   Cardiac Enzymes: Recent Labs  Lab 04/14/18 2110 04/15/18 0034 04/15/18 0643  TROPONINI <0.03 <0.03 <0.03   BNP (last 3 results) No results for input(s): PROBNP in the last 8760 hours. CBG: No results for input(s): GLUCAP in the last 168 hours. Studies: No results found.   Time spent: 35 minutes  Author: Berle Mull, MD [facility] 04/20/2018 8:56 PM  To reach On-call, see care teams to locate the attending and reach out to them via [email or web address]. If 7PM-7AM, please contact night-coverage If you still have difficulty reaching the attending provider, please page the [facility] (Director on Call) for [facility] on amion for assistance.

## 2018-04-20 NOTE — Telephone Encounter (Signed)
Called patient's wife earlier today. I do not have any objection for him to be transferred to any pain but he may want to stay there for another couple of days until renal function fully recovers. Patient's transaminases are normal. He therefore does not need liver biopsy. In retrospect liver injury may be due to alcoholic hepatitis. We will check sed rate at some point in future.

## 2018-04-21 DIAGNOSIS — K760 Fatty (change of) liver, not elsewhere classified: Secondary | ICD-10-CM

## 2018-04-21 DIAGNOSIS — N179 Acute kidney failure, unspecified: Principal | ICD-10-CM

## 2018-04-21 LAB — RENAL FUNCTION PANEL
Albumin: 1.4 g/dL — ABNORMAL LOW (ref 3.5–5.0)
Anion gap: 9 (ref 5–15)
BUN: 66 mg/dL — ABNORMAL HIGH (ref 8–23)
CO2: 24 mmol/L (ref 22–32)
CREATININE: 5.21 mg/dL — AB (ref 0.61–1.24)
Calcium: 7.5 mg/dL — ABNORMAL LOW (ref 8.9–10.3)
Chloride: 103 mmol/L (ref 98–111)
GFR calc Af Amer: 12 mL/min — ABNORMAL LOW (ref >60)
GFR calc non Af Amer: 10 mL/min — ABNORMAL LOW (ref >60)
Glucose, Bld: 83 mg/dL (ref 70–99)
Phosphorus: 4.2 mg/dL (ref 2.5–4.6)
Potassium: 3.5 mmol/L (ref 3.5–5.1)
SODIUM: 136 mmol/L (ref 135–145)

## 2018-04-21 LAB — C4 COMPLEMENT: Complement C4, Body Fluid: 16 mg/dL (ref 14–44)

## 2018-04-21 LAB — C3 COMPLEMENT: C3 COMPLEMENT: 104 mg/dL (ref 82–167)

## 2018-04-21 LAB — GLOMERULAR BASEMENT MEMBRANE ANTIBODIES: GBM Ab: 3 units (ref 0–20)

## 2018-04-21 MED ORDER — SODIUM BICARBONATE 650 MG PO TABS
650.0000 mg | ORAL_TABLET | Freq: Two times a day (BID) | ORAL | Status: DC
  Administered 2018-04-21 – 2018-04-26 (×10): 650 mg via ORAL
  Filled 2018-04-21 (×10): qty 1

## 2018-04-21 NOTE — Care Management Important Message (Signed)
Important Message  Patient Details  Name: Daniel Morse MRN: [id number] Date of Birth: [birth year]   Medicare Important Message Given:  Yes    Barb Merino [place] 04/21/2018, 12:18 PM

## 2018-04-21 NOTE — Progress Notes (Signed)
Daily Progress Note   Patient Name: Daniel Morse       Date: 04/21/2018 DOB: 1940-07-01  Age: 78 y.o. MRN#: [id number] Attending Physician: Lavina Hamman, MD Primary Care Physician: Sharilyn Sites, MD Admit Date: 04/14/2018  Reason for Consultation/Follow-up: Establishing goals of care  Subjective: Patient lying in bed awake. He is A&O x3. Denies pain or shortness of breath. Express he is feeling somewhat better and that his kidney functions are showing some slow improvement. No family at the bedside. States his wife and daughter are planning to visit tomorrow, to get some things done around the house. Verbalizes he is ready to get home also.   I spent a detailed amount of time allowing patient to express how he is feeling in regards to his current illness and co-morbidities. He is tearful when discussing his illness and states, "I know Jesus and I know where I am going when I die!" support given. Patient expressed he knows that he will be leaving the hospital in one of 2 ways. "feet first or head first". I asked him to explain. He expressed "if I get better I hope to be rolled out of here in a wheelchair, but if I don't I am not taking dialysis and I guess I will be rolled out head first on the coroner's stretcher dead!" I allowed for moments of silence and asked patient had he discussed further with family. He expressed he had discussed with his wife and daughter and they are in agreement that they were not interested in HD. He states they have had family members and friends and that is not the quality of life he would want to live or put his wife through. He stated he was a [place] and God had him unde control and if he passed away he was ok with that!   I discussed with patient again about  Palliative versus hospice support in the home. [name] expressed he was aware of both services. He is now in agreement for Palliative and verbalizes awareness that if he requires hospice care later he knows he can discuss with his outpatient medical team for assistance with transitioning of care. Support given.   Patient appreciative of our conversation. He is asking if I will continue to see him each day and when he leaves the  hospital. He is aware I will continue to support he and his family while hospitalized however, there is a different Palliative team outpatient.   Length of Stay: 7  Current Medications: Scheduled Meds:  . diltiazem  360 mg Oral Daily  . famotidine  20 mg Oral Daily  . feeding supplement (ENSURE ENLIVE)  237 mL Oral BID BM  . folic acid  1 mg Oral Daily  . levothyroxine  50 mcg Oral Q0600  . metoprolol tartrate  75 mg Oral BID  . multivitamin with minerals  1 tablet Oral Daily  . saccharomyces boulardii  250 mg Oral BID  . sodium bicarbonate  650 mg Oral BID  . sodium chloride flush  3 mL Intravenous Q12H  . thiamine  100 mg Oral Daily  . vancomycin  125 mg Oral QID    Continuous Infusions: . lactated ringers 100 mL/hr at 04/21/18 1518    PRN Meds: guaiFENesin-dextromethorphan, ondansetron **OR** ondansetron (ZOFRAN) IV, oxyCODONE, traZODone  Physical Exam Vitals signs and nursing note reviewed.  Constitutional:      General: He is awake.     Appearance: He is well-developed. He is ill-appearing.  Eyes:     Comments: Cornea redness bilaterally   Cardiovascular:     Rate and Rhythm: Tachycardia present.     Pulses: Normal pulses.     Heart sounds: Normal heart sounds.  Pulmonary:     Effort: Pulmonary effort is normal.     Breath sounds: Decreased breath sounds present.  Abdominal:     General: Bowel sounds are normal.     Palpations: Abdomen is soft.  Skin:    General: Skin is warm and dry.     Findings: Bruising present.  Neurological:      Mental Status: He is alert and oriented to person, place, and time.  Psychiatric:        Behavior: Behavior is cooperative.             Vital Signs: BP (!) 128/91   Pulse 98   Temp 98.6 F (37 C) (Oral)   Resp (!) 24   Ht $R'5\' 6"'kC$  (1.676 m)   Wt 92.5 kg Comment: Scale A  SpO2 95%   BMI 32.93 kg/m  SpO2: SpO2: 95 % O2 Device: O2 Device: Room Air O2 Flow Rate:    Intake/output summary:   Intake/Output Summary (Last 24 hours) at 04/21/2018 1547 Last data filed at 04/21/2018 1300 Gross per 24 hour  Intake 1920 ml  Output 1325 ml  Net 595 ml   LBM: Last BM Date: 04/21/18 Baseline Weight: Weight: 90.3 kg(scale a) Most recent weight: Weight: 92.5 kg(Scale A)       Palliative Assessment/Data:PPS 40%    Flowsheet Rows     Most Recent Value  Intake Tab  Referral Department  Hospitalist  Unit at Time of Referral  ER  Date Notified  04/15/18  Palliative Care Type  New Palliative care  Reason for referral  Clarify Goals of Care  Date of Admission  04/14/18  # of days IP prior to Palliative referral  1  Clinical Assessment  Psychosocial & Spiritual Assessment  Palliative Care Outcomes      Patient Active Problem List   Diagnosis Date Noted  . Malnutrition of moderate degree 04/15/2018  . Warfarin toxicity 04/14/2018  . Acute kidney failure ([place]) 04/14/2018  . 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 Hepatic steatosis 04/14/2018  . Perirectal abscess   . Family hx of colon cancer  04/19/2016  . History of colonic polyps 04/19/2016  . Encounter for therapeutic drug monitoring 04/14/2013  . Hypokalemia 12/03/2012  . Peripheral vascular disease ([place]) 11/05/2012  . Tobacco abuse   . Cerebrovascular disease   . Nephrolithiasis   . Hyperlipidemia   . Hypertension   . GERD (gastroesophageal reflux disease)   . Atrial fibrillation     Palliative Care Assessment & Plan   Patient Profile: 78 y.o. male  admitted on 04/14/2018 from home after presenting for a liver  biopsy and INR found to be 9. He has a past medical history of atrial fibrillation (Coumadin), GERD, hypertension, hyperlipidemia, macular degeneration (legally blind), peripheral vascular disease, and kidney stones. During his ED course he denied bleeding, chest pain, or shortness of breath. Family also reports patient is a heavy alcohol drinker however, does not wish for [place] to disclose to patient that we are aware as he reportedly quit about 8 weeks ago. 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 Previous ultrasound of scrotum showed epididymitis. Since admission patient is followed by Nephrology, no recommendations for HD at this time. He has shown some minor improvement with kidney function, watchful waiting in regards to kidney function. Palliative Medicine team consulted for goals of care.   Recommendations/Plan:  DNR/DNI  Continue to treat  Patient verbalized awareness of condition. He is direct in his wishes he does not want to pursue HD if needed. He states he is not going to live with a schedule of having to have HD 3 times a week or the complications he has seen others go through while receiving HD. He is aware if HD is required and he does wish to proceed it could result in his death and he verbalized he was accepting of this.   Declines hospice but is now accepting of Palliative.   PMT will continue to support and follow.    Goals of Care and Additional Recommendations:  Limitations on Scope of Treatment: Full Scope Treatment, No Hemodialysis and No Tracheostomy, no escalation of care  Code Status:    Code Status Orders  (From admission, onward)         Start     Ordered   04/16/18 1437  Do not attempt resuscitation (DNR)   Continuous    Question Answer Comment  In the event of cardiac or respiratory ARREST Do not call a "code blue"   In the event of cardiac or respiratory ARREST Do not perform Intubation, CPR, defibrillation or ACLS   In the event of cardiac or respiratory ARREST Use medication by any route, position, wound care, and other measures to relive pain and suffering. May use oxygen, suction and manual treatment of airway obstruction as needed for comfort.      04/16/18 1436        Code Status History    Date Active Date Inactive Code Status Order ID Comments User Context   04/14/2018 1856 04/16/2018 1436 Full Code [id number]  Lady Deutscher, MD ED      Prognosis:   Guarded to Poor   Discharge Planning:  To Be Determined  Care plan was discussed with patient and bedside RN.   Thank you for allowing the Palliative Medicine Team to assist in the care of  this patient.   Time In: 1230  Time Out: 1335 Total Time 65 min.  Prolonged Time Billed  YES       Greater than 50%  of this time was spent counseling and coordinating care related to the above assessment and plan.  Alda Lea, AGPCNP-BC Palliative Medicine Team  Pager: [phone] Amion: Bjorn Pippin   Please contact Palliative Medicine Team phone at [phone] for questions and concerns.

## 2018-04-21 NOTE — Progress Notes (Signed)
[facility] Progress Note   Subjective:     States feels better  States diarrhea resolved  Improved UOP, SCr slowly improving  ON IVFs  C3 and C4 wnl  sFLC K:L ratio is consistent with low GFR state  ANCA, GBM Ab pending  ANA neg; SPEP w/o M Spike  UP/C 0.25  Objective Vitals:   04/20/18 2324 04/20/18 2338 04/21/18 0311 04/21/18 0844  BP:  117/85 117/72 (!) 128/91  Pulse: 100 (!) 101 98   Resp:  19 17 (!) 22  Temp:  98.6 F (37 C) 98.6 F (37 C)   TempSrc:  Oral Oral   SpO2:  95% 95%   Weight:   92.5 kg   Height:       Physical Exam General: elderly man lying in bed in no distress Eyes: conjunctiva injected BL, L eye blind ENT: MMM Heart: tachy, irreg Lungs: normal WOB Abdomen: soft, nontender Extremities: no edema Skin: no rashes  Additional Objective Labs: Basic Metabolic Panel: Recent Labs  Lab 04/19/18 0336 04/20/18 0325 04/21/18 0228  NA 135 134* 136  K 3.2* 3.4* 3.5  CL 103 103 103  CO2 $Re'22 22 24  'rjK$ GLUCOSE 93 79 83  BUN 76* 72* 66*  CREATININE 5.82* 5.37* 5.21*  CALCIUM 7.5* 7.5* 7.5*  PHOS 5.0* 4.4 4.2   Liver Function Tests: Recent Labs  Lab 04/14/18 1323 04/15/18 0643  04/17/18 0826  04/19/18 0336 04/20/18 0325 04/21/18 0228  AST 50* 51*  --  36  --   --   --   --   ALT 40 41  --  30  --   --   --   --   ALKPHOS 71 84  --  69  --   --   --   --   BILITOT 1.7* 2.4*  --  2.6*  --   --   --   --   PROT 8.1 8.6*  --  7.5  --   --   --   --   ALBUMIN 1.8* 2.0*   < > 1.6*   < > 1.5* 1.5* 1.4*   < > = values in this interval not displayed.   No results for input(s): LIPASE, AMYLASE in the last 168 hours. CBC: Recent Labs  Lab 04/14/18 1323  04/16/18 0306 04/17/18 0356 04/18/18 0405 04/19/18 0336 04/20/18 0325  WBC 15.7*   < > 19.2* 20.5*  21.2* 22.5* 20.9* 19.3*  NEUTROABS 13.0*  --   --  18.2*  --   --   --   HGB 13.3   < > 12.4* 11.7*  11.8* 11.1* 11.8* 11.8*  HCT 39.1   < > 36.0* 35.4*  34.6* 33.5*  35.6* 34.6*  MCV 91.1   < > 91.1 91.7  91.5 92.5 92.0 91.8  PLT 440*   < > 367 328  341 331 329 342   < > = values in this interval not displayed.   Blood Culture    Component Value Date/Time   SDES BLOOD LEFT HAND 04/17/2018 0846   SPECREQUEST  04/17/2018 0846    BOTTLES DRAWN AEROBIC ONLY Blood Culture results may not be optimal due to an inadequate volume of blood received in culture bottles   CULT  04/17/2018 0846    NO GROWTH 3 DAYS Performed at [facility], [place] [address], [place], [place] [address]    REPTSTATUS PENDING 04/17/2018 6045    Cardiac Enzymes: Recent Labs  Lab 04/14/18 2110 04/15/18 0034 04/15/18 0643  TROPONINI <0.03 <0.03 <0.03   CBG: No results for input(s): GLUCAP in the last 168 hours. Iron Studies: No results for input(s): IRON, TIBC, TRANSFERRIN, FERRITIN in the last 72 hours. $RemoveB'@lablastinr3'fsEvMvCM$ @ Studies/Results: No results found. Medications: . lactated ringers 100 mL/hr at 04/21/18 0308   . diltiazem  360 mg Oral Daily  . famotidine  20 mg Oral Daily  . feeding supplement (ENSURE ENLIVE)  237 mL Oral BID BM  . folic acid  1 mg Oral Daily  . levothyroxine  50 mcg Oral Q0600  . metoprolol tartrate  75 mg Oral BID  . multivitamin with minerals  1 tablet Oral Daily  . saccharomyces boulardii  250 mg Oral BID  . sodium bicarbonate  650 mg Oral TID  . sodium chloride flush  3 mL Intravenous Q12H  . thiamine  100 mg Oral Daily  . vancomycin  125 mg Oral QID    2/11 CT:  Noting - Nonspecific mild perinephric fat stranding with renovascular calcifications bilaterally. No obstructive uropathy, renal mass or nephrolithiasis. Stable 14 mm cystic focus off the lower pole the left kidney with associated mild renal cortical scarring.  Assessment/Plan: 1.  AKI, severe, nonoliguric:  Baseline normal 03/2017, we don't have interval labs.  Maybe some improvement in past 24h? Microscopic Hematuria present, minor proteinuria with UP/C 0.28.    He  presented with INR 9 and renal function was initially improving with supportive care, making working Dx warfarin induced nephropathy. Pt has not been willing to discuss renal biopsy or dialysis to date.    SPEP with polyclonal gammopathy, UPEP with M spike though IFE pending - called yesterday and will be back "next week".  Serum free LC are not consistent with monoclonal process, rather low GFR.  UP/C 0.28.   ANA neg and C3+C4 WNL.  Imaging unrevealing.  Further serologies pending.  Per report (GI) hepatitis serologies are negative. Large globulin gap, but neg SFLC and SPEP.    PTH 133, suggesting this may not be a completely acute event.    At this point is showing evidence of recovery, would cont supportive care and closely follow.   2.  Leukocytosis: unclear etiology. Afebrile, no localizing s/s infection.  Hb was normal on presentation, now 11.8, Plt count normal.  I   3.  Transaminitis: following with hepatology (Dr. Laural Golden), liver biopsy aborted due to INR 9, though his transaminitis is now resolved.  Inflammatory markers were checked which were elevated and there is a suspicion for autoimmune disease.    4.  Atrial fibrillation:  Per primary.   5.  Metabolic acidosis:  Cont na bicarb 650 but reduce to BID. Bicarb >20 today.   Pearson Grippe MD

## 2018-04-21 NOTE — Progress Notes (Signed)
Patient in bathroom trying to have B.M. and H.R. goes up to 174 At Fib. Patient voiced no complaints . H.R. back down At. Fib. 110

## 2018-04-21 NOTE — Progress Notes (Signed)
[facility] Progress Note  Patient: Daniel Morse ZOX:[id number]   PCP: Sharilyn Sites, MD DOB: 1940-06-08   DOA: 04/14/2018   DOS: 04/21/2018   Date of Service: the patient was seen and examined on 04/21/2018  Brief hospital course: Pt. with PMH of chronic A. fib, HTN, HLD, GERD, alcohol abuse; admitted on 04/14/2018, presented with complaint of shortness of breath, was found to have acute kidney injury. Currently further plan is supportive measures.  Subjective: Diarrhea is finally settling down.  Nausea has resolved.  No fever no chills.  Assessment and Plan: 1.  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   He did receive IV dye at that point but his renal function was normal.  Hydration is currently on hold.  Renal function improving.  Urine output adequate. Nephrology consulted. Appreciate assistance Currently no indication for dialysis. Currently with diarrhea patient will be receiving IV hydration.  Will monitor. 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  UPEP has M spike, IFE pending.  Free light chain ratio elevated consistent with renal disease.  ANA negative, complements normal.  2.  Hepatic steatosis:  Etiology is unclear may be related to prior heavy alcohol drinking.   Patient states he has quit drinking.   However his INR is markedly elevated and he has had recent episode of what appeared to be some hepatitis that may have been alcoholic hepatitis. Unsure as the etiology.   Patient was scheduled for an outpatient liver biopsy but currently we will hold off on that.  3.  Chronic A. fib with RVR. On Cardizem and Lopressor at home. It was continued at home regimen but his heart rate remained elevated with hypotension. Echocardiogram shows preserved EF, no acute abnormality. TSH normal. Do not suspect that the patient actually has ACS. Anticoagulation with  Coumadin.  Pharmacy consulted.  INR therapeutic.  Coumadin currently on hold for possible renal biopsy. Started on IV Cardizem drip.  Transition to oral Cardizem. Continue Lopressor. Again on RVR on likely secondary to dehydration from diarrhea.  Replacing potassium replacing magnesium.  Cardizem dose increased.  Lopressor dose increased.  4.  Hypertension:  Pressures are soft.   Continue aggressive IV hydration.  5.  Gastroesophageal reflux disease:  With concern for diarrhea will discontinue PPI and switch to Pepcid.  6.  Hyperlipidemia:  Continue present care.  7.  Alcohol abuse in remission:  Reportedly has a history of being a volatile and possibly heavy drinker. Continue CIWA protocol.  8. Body mass index is 32.93 kg/m.  Nutrition Problem: Moderate Malnutrition Etiology: chronic illness(hepatic steatosis) Interventions: Interventions: Ensure Enlive (each supplement provides 350kcal and 20 grams of protein), MVI   9.  Leukocytosis. WBC elevated with an upward trend despite IV hydration which should cause dilution. No fevers overnight. Chest x-ray unremarkable for any pneumonia. Does not have any urinary symptoms.  UA unremarkable. So far no growth on blood cultures.  Procalcitonin minimally elevated.  Lactic acid normal. Currently holding off on any antibiotic initiation and will monitor.  10.  Diarrhea. C. difficile infection Patient reports multiple loose BM last night as well as this morning. Reports some nausea. Also reports some mild abdominal discomfort. Still able to tolerate oral diet. Patient is not on any stool softener in last 72 hours. He thinks that he is having diarrhea secondary to taking other medicines on empty stomach. With worsening leukocytosis as mentioned above without any actual  etiology for that, C. difficile PCR was ordered which is indeterminate with positive antigen but negative toxin EIA as well as toxigenic PCR. Due to significant  symptom burden as well as leukocytosis will go ahead and treat him with C. difficile protocol oral vancomycin.  Diet: cardiac diet DVT Prophylaxis: on therapeutic anticoagulation.  Advance goals of care discussion: full code  Family Communication: no family was present at bedside, at the time of interview. Called family per request Opportunity was given to ask question and all questions were answered satisfactorily.   Disposition:  Discharge to home.  Consultants: Nephrology Procedures: Echocardiogram   Scheduled Meds: . diltiazem  360 mg Oral Daily  . famotidine  20 mg Oral Daily  . feeding supplement (ENSURE ENLIVE)  237 mL Oral BID BM  . folic acid  1 mg Oral Daily  . levothyroxine  50 mcg Oral Q0600  . metoprolol tartrate  75 mg Oral BID  . multivitamin with minerals  1 tablet Oral Daily  . saccharomyces boulardii  250 mg Oral BID  . sodium bicarbonate  650 mg Oral BID  . sodium chloride flush  3 mL Intravenous Q12H  . thiamine  100 mg Oral Daily  . vancomycin  125 mg Oral QID   Continuous Infusions: . lactated ringers 100 mL/hr at 04/21/18 1518   PRN Meds: guaiFENesin-dextromethorphan, ondansetron **OR** ondansetron (ZOFRAN) IV, oxyCODONE, traZODone Antibiotics: Anti-infectives (From admission, onward)   Start     Dose/Rate Route Frequency Ordered Stop   04/19/18 1800  vancomycin (VANCOCIN) 50 mg/mL oral solution 125 mg     125 mg Oral 4 times daily 04/19/18 1633 04/29/18 1759       Objective: Physical Exam: Vitals:   04/21/18 0311 04/21/18 0844 04/21/18 1100 04/21/18 1200  BP: 117/72 (!) 128/91    Pulse: 98     Resp: 17 (!) 22 18 (!) 24  Temp: 98.6 F (37 C)     TempSrc: Oral     SpO2: 95%     Weight: 92.5 kg     Height:        Intake/Output Summary (Last 24 hours) at 04/21/2018 1754 Last data filed at 04/21/2018 1700 Gross per 24 hour  Intake 1560 ml  Output 1350 ml  Net 210 ml   Filed Weights   04/19/18 0401 04/20/18 0500 04/21/18 0311  Weight:  92 kg 92.4 kg 92.5 kg   General: Alert, Awake and Oriented to Time, Place and Person. Appear in mild distress, affect appropriate Eyes: PERRL, Conjunctival redness ENT: Oral Mucosa clear dry. Neck: no JVD, no Abnormal Mass Or lumps Cardiovascular: S1 and S2 Present, aortic systolic  Murmur, Peripheral Pulses Present Respiratory: normal respiratory effort, Bilateral Air entry equal and Decreased, no use of accessory muscle, Clear to Auscultation, no Crackles, no wheezes Abdomen: Bowel Sound present, Soft and mild tenderness, no hernia Skin: no redness, no Rash, no induration Extremities: no Pedal edema, no calf tenderness Neurologic: Grossly no focal neuro deficit. Bilaterally Equal motor strength  Data Reviewed: CBC: Recent Labs  Lab 04/16/18 0306 04/17/18 0356 04/18/18 0405 04/19/18 0336 04/20/18 0325  WBC 19.2* 20.5*  21.2* 22.5* 20.9* 19.3*  NEUTROABS  --  18.2*  --   --   --   HGB 12.4* 11.7*  11.8* 11.1* 11.8* 11.8*  HCT 36.0* 35.4*  34.6* 33.5* 35.6* 34.6*  MCV 91.1 91.7  91.5 92.5 92.0 91.8  PLT 367 328  341 331 329 562   Basic Metabolic Panel: Recent Labs  Lab 04/16/18 0306 04/17/18 0826 04/18/18 0405 04/19/18 0336 04/20/18 0325 04/21/18 0228  NA 135 135 136 135 134* 136  K 3.6 3.6 3.2* 3.2* 3.4* 3.5  CL 102 103 103 103 103 103  CO2 19* 20* 21* $Remov'22 22 24  'lcAtWg$ GLUCOSE 98 99 103* 93 79 83  BUN 92* 80* 76* 76* 72* 66*  CREATININE 6.98* 6.17* 5.82* 5.82* 5.37* 5.21*  CALCIUM 7.2* 7.5* 7.7* 7.5* 7.5* 7.5*  MG 1.7 2.0  --   --   --   --   PHOS 5.2* 5.0* 5.0* 5.0* 4.4 4.2    Liver Function Tests: Recent Labs  Lab 04/15/18 0643  04/17/18 0826 04/18/18 0405 04/19/18 0336 04/20/18 0325 04/21/18 0228  AST 51*  --  36  --   --   --   --   ALT 41  --  30  --   --   --   --   ALKPHOS 84  --  69  --   --   --   --   BILITOT 2.4*  --  2.6*  --   --   --   --   PROT 8.6*  --  7.5  --   --   --   --   ALBUMIN 2.0*   < > 1.6* 1.4* 1.5* 1.5* 1.4*   < > = values in  this interval not displayed.   No results for input(s): LIPASE, AMYLASE in the last 168 hours. No results for input(s): AMMONIA in the last 168 hours. Coagulation Profile: Recent Labs  Lab 04/15/18 0643 04/16/18 0306 04/17/18 0356 04/18/18 0405 04/19/18 0336  INR 1.84 1.60 1.64 1.92 2.60   Cardiac Enzymes: Recent Labs  Lab 04/14/18 2110 04/15/18 0034 04/15/18 0643  TROPONINI <0.03 <0.03 <0.03   BNP (last 3 results) No results for input(s): PROBNP in the last 8760 hours. CBG: No results for input(s): GLUCAP in the last 168 hours. Studies: No results found.   Time spent: 35 minutes  Author: Berle Mull, MD [facility] 04/21/2018 5:54 PM  To reach On-call, see care teams to locate the attending and reach out to them via [email or web address]. If 7PM-7AM, please contact night-coverage If you still have difficulty reaching the attending provider, please page the [facility] (Director on Call) for [facility] on amion for assistance.

## 2018-04-22 ENCOUNTER — Ambulatory Visit: Payer: Medicare HMO | Admitting: Cardiology

## 2018-04-22 DIAGNOSIS — N170 Acute kidney failure with tubular necrosis: Secondary | ICD-10-CM

## 2018-04-22 LAB — CBC WITH DIFFERENTIAL/PLATELET
Abs Immature Granulocytes: 0.15 10*3/uL — ABNORMAL HIGH (ref 0.00–0.07)
BASOS ABS: 0.1 10*3/uL (ref 0.0–0.1)
Basophils Relative: 0 %
EOS PCT: 0 %
Eosinophils Absolute: 0 10*3/uL (ref 0.0–0.5)
HCT: 33.3 % — ABNORMAL LOW (ref 39.0–52.0)
Hemoglobin: 11.3 g/dL — ABNORMAL LOW (ref 13.0–17.0)
Immature Granulocytes: 1 %
Lymphocytes Relative: 7 %
Lymphs Abs: 1.6 10*3/uL (ref 0.7–4.0)
MCH: 31.1 pg (ref 26.0–34.0)
MCHC: 33.9 g/dL (ref 30.0–36.0)
MCV: 91.7 fL (ref 80.0–100.0)
Monocytes Absolute: 0.7 10*3/uL (ref 0.1–1.0)
Monocytes Relative: 3 %
Neutro Abs: 21.2 10*3/uL — ABNORMAL HIGH (ref 1.7–7.7)
Neutrophils Relative %: 89 %
Platelets: 287 10*3/uL (ref 150–400)
RBC: 3.63 MIL/uL — ABNORMAL LOW (ref 4.22–5.81)
RDW: 14.3 % (ref 11.5–15.5)
WBC: 23.8 10*3/uL — ABNORMAL HIGH (ref 4.0–10.5)
nRBC: 0 % (ref 0.0–0.2)

## 2018-04-22 LAB — CULTURE, BLOOD (ROUTINE X 2)
CULTURE: NO GROWTH
Culture: NO GROWTH
Special Requests: ADEQUATE

## 2018-04-22 LAB — ANCA TITERS
Atypical P-ANCA titer: <1:20 {titer}
C-ANCA: <1:20 {titer}
P-ANCA: 1:640 {titer}

## 2018-04-22 LAB — RENAL FUNCTION PANEL
Albumin: 1.4 g/dL — ABNORMAL LOW (ref 3.5–5.0)
Anion gap: 9 (ref 5–15)
BUN: 62 mg/dL — ABNORMAL HIGH (ref 8–23)
CHLORIDE: 101 mmol/L (ref 98–111)
CO2: 25 mmol/L (ref 22–32)
CREATININE: 4.98 mg/dL — AB (ref 0.61–1.24)
Calcium: 7.3 mg/dL — ABNORMAL LOW (ref 8.9–10.3)
GFR calc Af Amer: 12 mL/min — ABNORMAL LOW (ref >60)
GFR calc non Af Amer: 11 mL/min — ABNORMAL LOW (ref >60)
Glucose, Bld: 95 mg/dL (ref 70–99)
Phosphorus: 3.9 mg/dL (ref 2.5–4.6)
Potassium: 3.3 mmol/L — ABNORMAL LOW (ref 3.5–5.1)
SODIUM: 135 mmol/L (ref 135–145)

## 2018-04-22 LAB — MPO/PR-3 (ANCA) ANTIBODIES
ANCA Proteinase 3: 4.1 U/mL — ABNORMAL HIGH (ref 0.0–3.5)
Myeloperoxidase Abs: 62.1 U/mL — ABNORMAL HIGH (ref 0.0–9.0)

## 2018-04-22 LAB — MAGNESIUM: Magnesium: 1.4 mg/dL — ABNORMAL LOW (ref 1.7–2.4)

## 2018-04-22 MED ORDER — POTASSIUM CHLORIDE CRYS ER 20 MEQ PO TBCR
40.0000 meq | EXTENDED_RELEASE_TABLET | Freq: Once | ORAL | Status: AC
  Administered 2018-04-22: 40 meq via ORAL
  Filled 2018-04-22: qty 2

## 2018-04-22 MED ORDER — MAGNESIUM SULFATE 4 GM/100ML IV SOLN
4.0000 g | Freq: Once | INTRAVENOUS | Status: AC
  Administered 2018-04-22: 4 g via INTRAVENOUS
  Filled 2018-04-22: qty 100

## 2018-04-22 MED ORDER — PRO-STAT SUGAR FREE PO LIQD
30.0000 mL | Freq: Two times a day (BID) | ORAL | Status: DC
  Administered 2018-04-22: 30 mL via ORAL
  Filled 2018-04-22 (×7): qty 30

## 2018-04-22 NOTE — Progress Notes (Signed)
PT has had no BM this date. Pt is asymptomatic for GI issues. Will continue to monitor. Jerald Kief, RN

## 2018-04-22 NOTE — Progress Notes (Signed)
[facility] Progress Note   Subjective:      Afebrile, blood pressure stable   0.9 L recorded U OP, 4 unmeasured voids  Serum creatinine improved to 4.98, BUN also improved.  K3.3, bicarbonate 25.  WBC elevated to 23.8.  Has had some recurrence of diarrhea  States that he has no interest to follow with nephrology in [place] upon discharge, he will have primary care or GI referred him to Dr. Lowanda Foster   GBM antibodies negative  ANCA panel pending, gout present  C3 and C4 wnl  sFLC K:L ratio is consistent with low GFR state  ANCA, GBM Ab pending  ANA neg; SPEP w/o M Spike  UP/C 0.25  Objective Vitals:   04/22/18 0801 04/22/18 0835 04/22/18 0838 04/22/18 0839  BP: 115/74  115/74 115/74  Pulse: (!) 108   (!) 108  Resp: 15 19    Temp: 98 F (36.7 C)     TempSrc: Oral     SpO2: 94%     Weight:      Height:       Physical Exam General: elderly man lying in bed in no distress Eyes: conjunctiva injected BL, L eye blind ENT: MMM Heart: tachy, irreg Lungs: normal WOB Abdomen: soft, nontender Extremities: no edema Skin: no rashes  Additional Objective Labs: Basic Metabolic Panel: Recent Labs  Lab 04/20/18 0325 04/21/18 0228 04/22/18 0233  NA 134* 136 135  K 3.4* 3.5 3.3*  CL 103 103 101  CO2 $Re'22 24 25  'TVs$ GLUCOSE 79 83 95  BUN 72* 66* 62*  CREATININE 5.37* 5.21* 4.98*  CALCIUM 7.5* 7.5* 7.3*  PHOS 4.4 4.2 3.9   Liver Function Tests: Recent Labs  Lab 04/17/18 0826  04/20/18 0325 04/21/18 0228 04/22/18 0233  AST 36  --   --   --   --   ALT 30  --   --   --   --   ALKPHOS 69  --   --   --   --   BILITOT 2.6*  --   --   --   --   PROT 7.5  --   --   --   --   ALBUMIN 1.6*   < > 1.5* 1.4* 1.4*   < > = values in this interval not displayed.   No results for input(s): LIPASE, AMYLASE in the last 168 hours. CBC: Recent Labs  Lab 04/17/18 0356 04/18/18 0405 04/19/18 0336 04/20/18 0325 04/22/18 0233  WBC 20.5*  21.2* 22.5*  20.9* 19.3* 23.8*  NEUTROABS 18.2*  --   --   --  21.2*  HGB 11.7*  11.8* 11.1* 11.8* 11.8* 11.3*  HCT 35.4*  34.6* 33.5* 35.6* 34.6* 33.3*  MCV 91.7  91.5 92.5 92.0 91.8 91.7  PLT 328  341 331 329 342 287   Blood Culture    Component Value Date/Time   SDES BLOOD LEFT HAND 04/17/2018 0846   SPECREQUEST  04/17/2018 0846    BOTTLES DRAWN AEROBIC ONLY Blood Culture results may not be optimal due to an inadequate volume of blood received in culture bottles   CULT  04/17/2018 0846    NO GROWTH 4 DAYS Performed at [facility], [place] [address] [place], [place] [address]    REPTSTATUS PENDING 04/17/2018 9767    Cardiac Enzymes: No results for input(s): CKTOTAL, CKMB, CKMBINDEX, TROPONINI in the last 168 hours. CBG: No results for input(s): GLUCAP in the last 168 hours. Iron Studies: No  results for input(s): IRON, TIBC, TRANSFERRIN, FERRITIN in the last 72 hours. $RemoveB'@lablastinr3'sXGswbxV$ @ Studies/Results: No results found. Medications: . lactated ringers 100 mL/hr at 04/22/18 0955  . magnesium sulfate 1 - 4 g bolus IVPB 4 g (04/22/18 0958)   . diltiazem  360 mg Oral Daily  . famotidine  20 mg Oral Daily  . feeding supplement (ENSURE ENLIVE)  237 mL Oral BID BM  . folic acid  1 mg Oral Daily  . levothyroxine  50 mcg Oral Q0600  . metoprolol tartrate  75 mg Oral BID  . multivitamin with minerals  1 tablet Oral Daily  . saccharomyces boulardii  250 mg Oral BID  . sodium bicarbonate  650 mg Oral BID  . sodium chloride flush  3 mL Intravenous Q12H  . thiamine  100 mg Oral Daily  . vancomycin  125 mg Oral QID    2/11 CT:  Noting - Nonspecific mild perinephric fat stranding with renovascular calcifications bilaterally. No obstructive uropathy, renal mass or nephrolithiasis. Stable 14 mm cystic focus off the lower pole the left kidney with associated mild renal cortical scarring.  Assessment/Plan: 1.  AKI, severe, nonoliguric:  Baseline normal 03/2017, we don't have interval labs.   Now is slowly improving. Microscopic Hematuria present, minor proteinuria with UP/C 0.28.    He presented with INR 9 and renal function was initially improving with supportive care, making working Dx warfarin induced nephropathy. Pt has not been willing to discuss renal biopsy or dialysis to date.    SPEP with polyclonal gammopathy, UPEP with M spike.  Serum free LC are not consistent with monoclonal process, rather a low GFR.  UP/C 0.28.   ANA neg and C3+C4 WNL.  GBM Ab neg. Imaging unrevealing.  ANCA serologies pending.  Per report (GI) hepatitis serologies are negative. Large globulin gap, but neg SFLC and SPEP.    PTH 133, suggesting this may not be a completely acute event.    At this point is showing evidence of recovery, would cont supportive care and closely follow. He wishes to fu at DC with [facility] in [place].   2.  Leukocytosis: unclear etiology. Afebrile, no localizing s/s infection.  Hb was normal on presentation, now 11.8, Plt count normal.  I  3.  Transaminitis: following with hepatology (Dr. Laural Golden), liver biopsy aborted due to INR 9, though his transaminitis is now resolved.  Inflammatory markers were checked which were elevated and there is a suspicion for autoimmune disease.    4.  Atrial fibrillation:  Per primary.   5.  Metabolic acidosis:  Cont na bicarb 650  BID. Bicarb >20 today.   Pearson Grippe MD

## 2018-04-22 NOTE — Plan of Care (Signed)
  Problem: Education: Goal: Knowledge of General Education information will improve Description Including pain rating scale, medication(s)/side effects and non-pharmacologic comfort measures Outcome: Progressing   Problem: Health Behavior/Discharge Planning: Goal: Ability to manage health-related needs will improve Outcome: Progressing   

## 2018-04-22 NOTE — Progress Notes (Signed)
Nutrition Follow-up  DOCUMENTATION CODES:   Non-severe (moderate) malnutrition in context of chronic illness, Obesity unspecified  INTERVENTION:   -Continue MVI with minerals daily -Continue Ensure Enlive po BID, each supplement provides 350 kcal and 20 grams of protein -Continue 30 ml Prostat BID, each supplement provides 100 kcals and 15 grams protein  NUTRITION DIAGNOSIS:   Moderate Malnutrition related to chronic illness(hepatic steatosis) as evidenced by energy intake < or equal to 75% for > or equal to 1 month, mild fat depletion, moderate fat depletion, mild muscle depletion, moderate muscle depletion.  Ongoing  GOAL:   Patient will meet greater than or equal to 90% of their needs  Progressing  MONITOR:   PO intake, Supplement acceptance, Labs, Weight trends, Skin, I & O's  REASON FOR ASSESSMENT:   Malnutrition Screening Tool, Consult Assessment of nutrition requirement/status  ASSESSMENT:   Daniel Morse is a 78 y.o. male with medical history significant of atrial fibrillation on chronic Coumadin therapy, hypertension, hyperlipidemia, gastroesophageal reflux disease, kidney stones who scheduled to have a liver biopsy today to further evaluate weight loss, decreased oral intake and an ultrasound showing hepatic steatosis.  On presentation for biopsy patient was found to have an INR of 9 and was sent to the emergency department for evaluation.  He denies any bleeding he has had no chest pain or palpitations no shortness of breath and does report some fatigue.  On further evaluation his creatinine was found to be in the 8-1/2 range and he was referred to me for further evaluation and management  2/12- transferred to PCU 2/16- probiotic initiated  Reviewed I/O's: +515 ml x 24 hours and +5.4 L since admission  Spoke with pt at bedside, who reports feeling poorly today secondary to headache and diarrhea. Pt reports 2-3 days of extremely poor oral intake secondary to  diarrhea and fear of intake. Pt shares that everything "runs right through me". Meal completion 0%. Pt has been consuming Ensure supplements, but no solid foods due to fear of eating. Pt reports he will continue to drink Ensure supplements and tolerates them well, but does not think he can consume more than two per day; also amenable to Prostat. Discussed importance of good meal and supplement intake to promote healing.   Nephrology notes indicate signs of kidney recovery- pt with no interest in pursuing HD if kidney function declines.   Labs reviewed: K: 3.3, Mg: 1.4. Phos WDL.   Diet Order:   Diet Order            Diet Heart Room service appropriate? Yes; Fluid consistency: Thin  Diet effective now              EDUCATION NEEDS:   Education needs have been addressed  Skin:  Skin Assessment: Reviewed RN Assessment  Last BM:  04/22/18  Height:   Ht Readings from Last 1 Encounters:  04/14/18 $RemoveB'5\' 6"'jaWVEwmB$  (1.676 m)    Weight:   Wt Readings from Last 1 Encounters:  04/22/18 93.2 kg    Ideal Body Weight:  64.5 kg  BMI:  Body mass index is 33.17 kg/m.  Estimated Nutritional Needs:   Kcal:  1900-2100  Protein:  95-110 grams  Fluid:  1.9-2.1 L    Quanisha Drewry A. Jimmye Norman, RD, LDN, CDE Pager: [phone] After hours Pager: [phone]

## 2018-04-22 NOTE — Progress Notes (Signed)
PROGRESS NOTE    Daniel Morse  AQT:[id number] DOB: [birth year] DOA: 04/14/2018 PCP: Sharilyn Sites, MD   Brief Narrative:  78 year old with past medical history relevant for hypothyroidism, hyperlipidemia, paroxysmal atrial fibrillation on warfarin, hypertension, history of alcohol abuse in remission who presented on 04/14/2018 with elevated INR and was admitted for AKI and significant diarrhea.   Assessment & Plan:   Principal Problem:   Acute kidney failure (HCC) Active Problems:   Hyperlipidemia   Hypertension   GERD (gastroesophageal reflux disease)   Atrial fibrillation   Warfarin toxicity   Alcohol abuse, in remission   Hepatic steatosis   Malnutrition of moderate degree   #) AKI: Unclear etiology however slightly improving.  Does not appear to be classic for prerenal.  He did have significant hematuria raising concern about possible warfarin-induced nephropathy as he was supratherapeutic on his INR. -Nephrology following, appreciate recommendations they her going through an RPGN work-up that is thus far unrevealing - Hold nephrotoxins -IV fluids  #) Diarrhea: Patient has had intermittent significant amounts of diarrhea.  This is not enough to explain his AKI.  His C. difficile toxin was positive but the PCR was negative.  However he is symptomatic. - Continue oral vancomycin started 04/19/2018 -We will order stool PCR  #) History of alcohol abuse: Per review of the chart patient is quite a heavy drinker until about 2 months ago.  Apparently the patient does not open to discussing his drinking and there is a concern for the wife safety.  Please do not discuss this in front of the patient.  #) Hypothyroidism: -Continue levothyroxine 50 mcg daily  #) Hypertension/paroxysmal atrial fibrillation: Patient was initially supratherapeutic -Warfarin per pharmacy consult -Hold diltiazem and metoprolol  Fluids: IV fluids. Electrolytes: Monitor and supplement Nutrition: Heart  healthy diet  Prophylaxis: Supratherapeutic INR, warfarin  Disposition: Pending improvement in creatinine  DNR  Consultants:   Nephrology  Procedures:   None  Antimicrobials:   P.o. vancomycin started 04/19/2018   Subjective: Patient reports that his diarrhea returned today.  He reports going several times.  He denies any nausea, vomiting, diarrhea, cough, congestion, rhinorrhea.  Objective: Vitals:   04/22/18 0801 04/22/18 0835 04/22/18 0838 04/22/18 0839  BP: 115/74  115/74 115/74  Pulse: (!) 108   (!) 108  Resp: 15 19    Temp: 98 F (36.7 C)     TempSrc: Oral     SpO2: 94%     Weight:      Height:        Intake/Output Summary (Last 24 hours) at 04/22/2018 1150 Last data filed at 04/22/2018 0802 Gross per 24 hour  Intake 1360 ml  Output 650 ml  Net 710 ml   Filed Weights   04/20/18 0500 04/21/18 0311 04/22/18 0416  Weight: 92.4 kg 92.5 kg 93.2 kg    Examination:  General exam: Appears calm and comfortable  Respiratory system: Clear to auscultation. Respiratory effort normal. Cardiovascular system: Regular rate and rhythm, no murmurs Gastrointestinal system: Abdomen is nondistended, soft and nontender. No organomegaly or masses felt. Normal bowel sounds heard. Central nervous system: Alert and oriented.  Grossly intact, moving all extremities Extremities: Trace lower extremity edema Skin: No rashes over visible skin Psychiatry: Judgement and insight appear normal. Mood & affect appropriate.     Data Reviewed: I have personally reviewed following labs and imaging studies  CBC: Recent Labs  Lab 04/17/18 0356 04/18/18 0405 04/19/18 0336 04/20/18 0325 04/22/18 0233  WBC 20.5*  21.2* 22.5*  20.9* 19.3* 23.8*  NEUTROABS 18.2*  --   --   --  21.2*  HGB 11.7*  11.8* 11.1* 11.8* 11.8* 11.3*  HCT 35.4*  34.6* 33.5* 35.6* 34.6* 33.3*  MCV 91.7  91.5 92.5 92.0 91.8 91.7  PLT 328  341 331 329 342 841   Basic Metabolic Panel: Recent Labs  Lab  04/16/18 0306 04/17/18 0826 04/18/18 0405 04/19/18 0336 04/20/18 0325 04/21/18 0228 04/22/18 0233  NA 135 135 136 135 134* 136 135  K 3.6 3.6 3.2* 3.2* 3.4* 3.5 3.3*  CL 102 103 103 103 103 103 101  CO2 19* 20* 21* $Remov'22 22 24 25  'NVufUN$ GLUCOSE 98 99 103* 93 79 83 95  BUN 92* 80* 76* 76* 72* 66* 62*  CREATININE 6.98* 6.17* 5.82* 5.82* 5.37* 5.21* 4.98*  CALCIUM 7.2* 7.5* 7.7* 7.5* 7.5* 7.5* 7.3*  MG 1.7 2.0  --   --   --   --  1.4*  PHOS 5.2* 5.0* 5.0* 5.0* 4.4 4.2 3.9   GFR: Estimated Creatinine Clearance: 13.7 mL/min (A) (by C-G formula based on SCr of 4.98 mg/dL (H)). Liver Function Tests: Recent Labs  Lab 04/17/18 0826 04/18/18 0405 04/19/18 0336 04/20/18 0325 04/21/18 0228 04/22/18 0233  AST 36  --   --   --   --   --   ALT 30  --   --   --   --   --   ALKPHOS 69  --   --   --   --   --   BILITOT 2.6*  --   --   --   --   --   PROT 7.5  --   --   --   --   --   ALBUMIN 1.6* 1.4* 1.5* 1.5* 1.4* 1.4*   No results for input(s): LIPASE, AMYLASE in the last 168 hours. No results for input(s): AMMONIA in the last 168 hours. Coagulation Profile: Recent Labs  Lab 04/16/18 0306 04/17/18 0356 04/18/18 0405 04/19/18 0336  INR 1.60 1.64 1.92 2.60   Cardiac Enzymes: No results for input(s): CKTOTAL, CKMB, CKMBINDEX, TROPONINI in the last 168 hours. BNP (last 3 results) No results for input(s): PROBNP in the last 8760 hours. HbA1C: No results for input(s): HGBA1C in the last 72 hours. CBG: No results for input(s): GLUCAP in the last 168 hours. Lipid Profile: No results for input(s): CHOL, HDL, LDLCALC, TRIG, CHOLHDL, LDLDIRECT in the last 72 hours. Thyroid Function Tests: No results for input(s): TSH, T4TOTAL, FREET4, T3FREE, THYROIDAB in the last 72 hours. Anemia Panel: No results for input(s): VITAMINB12, FOLATE, FERRITIN, TIBC, IRON, RETICCTPCT in the last 72 hours. Sepsis Labs: Recent Labs  Lab 04/17/18 0826  PROCALCITON 0.45  LATICACIDVEN 1.4    Recent Results  (from the past 240 hour(s))  Urine Culture     Status: Abnormal   Collection Time: 04/14/18  3:48 PM  Result Value Ref Range Status   Specimen Description URINE, RANDOM  Final   Special Requests NONE  Final   Culture (A)  Final    <10,000 COLONIES/mL INSIGNIFICANT GROWTH Performed at [facility], [place] [address] [place],  [address]    Report Status 04/15/2018 FINAL  Final  Culture, blood (x 2)     Status: None   Collection Time: 04/17/18  8:33 AM  Result Value Ref Range Status   Specimen Description BLOOD LEFT HAND  Final   Special Requests   Final    BOTTLES  DRAWN AEROBIC ONLY Blood Culture adequate volume   Culture   Final    NO GROWTH 5 DAYS Performed at [facility], [place] [address], [place], [place] [address]    Report Status 04/22/2018 FINAL  Final  Culture, blood (x 2)     Status: None   Collection Time: 04/17/18  8:46 AM  Result Value Ref Range Status   Specimen Description BLOOD LEFT HAND  Final   Special Requests   Final    BOTTLES DRAWN AEROBIC ONLY Blood Culture results may not be optimal due to an inadequate volume of blood received in culture bottles   Culture   Final    NO GROWTH 5 DAYS Performed at [facility], [place] [address] [place], [place] [address]    Report Status 04/22/2018 FINAL  Final  C difficile quick scan w PCR reflex     Status: Abnormal   Collection Time: 04/19/18  8:52 AM  Result Value Ref Range Status   C Diff antigen POSITIVE (A) NEGATIVE Final   C Diff toxin NEGATIVE NEGATIVE Final   C Diff interpretation Results are indeterminate. See PCR results.  Final  C. Diff by PCR, Reflexed     Status: None   Collection Time: 04/19/18  8:52 AM  Result Value Ref Range Status   Toxigenic C. Difficile by PCR NEGATIVE NEGATIVE Final    Comment: Patient is colonized with non toxigenic C. difficile. May not need treatment unless significant symptoms are present. Performed at [facility], [place] [address] [place],  [place] [address]          Radiology Studies: No results found.      Scheduled Meds: . diltiazem  360 mg Oral Daily  . famotidine  20 mg Oral Daily  . feeding supplement (ENSURE ENLIVE)  237 mL Oral BID BM  . feeding supplement (PRO-STAT SUGAR FREE 64)  30 mL Oral BID  . folic acid  1 mg Oral Daily  . levothyroxine  50 mcg Oral Q0600  . metoprolol tartrate  75 mg Oral BID  . multivitamin with minerals  1 tablet Oral Daily  . saccharomyces boulardii  250 mg Oral BID  . sodium bicarbonate  650 mg Oral BID  . sodium chloride flush  3 mL Intravenous Q12H  . thiamine  100 mg Oral Daily  . vancomycin  125 mg Oral QID   Continuous Infusions: . lactated ringers 100 mL/hr at 04/22/18 0955  . magnesium sulfate 1 - 4 g bolus IVPB 4 g (04/22/18 0958)     LOS: 8 days    Time spent: [place], MD [facility]  If 7PM-7AM, please contact night-coverage [email or web address] Password [facility] 04/22/2018, 11:50 AM

## 2018-04-23 ENCOUNTER — Encounter (HOSPITAL_COMMUNITY): Payer: Self-pay | Admitting: Radiology

## 2018-04-23 LAB — GASTROINTESTINAL PANEL BY PCR, STOOL (REPLACES STOOL CULTURE)

## 2018-04-23 LAB — COMPREHENSIVE METABOLIC PANEL
ALT: 25 U/L (ref 0–44)
AST: 34 U/L (ref 15–41)
Albumin: 1.4 g/dL — ABNORMAL LOW (ref 3.5–5.0)
Alkaline Phosphatase: 78 U/L (ref 38–126)
Anion gap: 10 (ref 5–15)
BUN: 63 mg/dL — ABNORMAL HIGH (ref 8–23)
CO2: 25 mmol/L (ref 22–32)
Calcium: 7.4 mg/dL — ABNORMAL LOW (ref 8.9–10.3)
Creatinine, Ser: 5.15 mg/dL — ABNORMAL HIGH (ref 0.61–1.24)
GFR calc non Af Amer: 10 mL/min — ABNORMAL LOW (ref >60)
Glucose, Bld: 98 mg/dL (ref 70–99)
Potassium: 3.3 mmol/L — ABNORMAL LOW (ref 3.5–5.1)
Sodium: 134 mmol/L — ABNORMAL LOW (ref 135–145)
Total Bilirubin: 1.5 mg/dL — ABNORMAL HIGH (ref 0.3–1.2)
Total Protein: 7.3 g/dL (ref 6.5–8.1)

## 2018-04-23 LAB — COMPREHENSIVE METABOLIC PANEL WITH GFR
Chloride: 99 mmol/L (ref 98–111)
GFR calc Af Amer: 12 mL/min — ABNORMAL LOW (ref >60)

## 2018-04-23 LAB — PROTIME-INR
INR: 3.15
Prothrombin Time: 31.9 s — ABNORMAL HIGH (ref 11.4–15.2)

## 2018-04-23 LAB — CBC
HCT: 34.1 % — ABNORMAL LOW (ref 39.0–52.0)
Hemoglobin: 11.1 g/dL — ABNORMAL LOW (ref 13.0–17.0)
MCH: 29.8 pg (ref 26.0–34.0)
MCHC: 32.6 g/dL (ref 30.0–36.0)
MCV: 91.7 fL (ref 80.0–100.0)
Platelets: 263 K/uL (ref 150–400)
RBC: 3.72 MIL/uL — ABNORMAL LOW (ref 4.22–5.81)
RDW: 14.4 % (ref 11.5–15.5)
WBC: 21.3 10*3/uL — ABNORMAL HIGH (ref 4.0–10.5)
nRBC: 0 % (ref 0.0–0.2)

## 2018-04-23 LAB — TYPE AND SCREEN
ABO/RH(D): O POS
Antibody Screen: NEGATIVE

## 2018-04-23 LAB — MAGNESIUM: Magnesium: 2.1 mg/dL (ref 1.7–2.4)

## 2018-04-23 LAB — PHOSPHORUS: Phosphorus: 4.5 mg/dL (ref 2.5–4.6)

## 2018-04-23 MED ORDER — POTASSIUM CHLORIDE CRYS ER 20 MEQ PO TBCR
40.0000 meq | EXTENDED_RELEASE_TABLET | Freq: Once | ORAL | Status: AC
  Administered 2018-04-23: 40 meq via ORAL
  Filled 2018-04-23: qty 2

## 2018-04-23 MED ORDER — SODIUM CHLORIDE 0.9% IV SOLUTION
Freq: Once | INTRAVENOUS | Status: AC
  Administered 2018-04-23: 14:00:00 via INTRAVENOUS

## 2018-04-23 MED ORDER — VITAMIN K1 10 MG/ML IJ SOLN
10.0000 mg | Freq: Once | INTRAVENOUS | Status: AC
  Administered 2018-04-23: 10 mg via INTRAVENOUS
  Filled 2018-04-23: qty 1

## 2018-04-23 NOTE — Progress Notes (Signed)
Dr Rajiv Moors wants the FFP to start at 8pm 04/23/18. INR to be checked @ 5 am.  Jerald Kief, RN

## 2018-04-23 NOTE — Plan of Care (Signed)

## 2018-04-23 NOTE — Progress Notes (Signed)
PROGRESS NOTE    Daniel Morse  OEU:[id number] DOB: [birth year] DOA: 04/14/2018 PCP: Sharilyn Sites, MD   Brief Narrative:  78 year old with past medical history relevant for hypothyroidism, hyperlipidemia, paroxysmal atrial fibrillation on warfarin, hypertension, history of alcohol abuse in remission who presented on 04/14/2018 with elevated INR and was admitted for AKI and significant diarrhea.   Assessment & Plan:   Principal Problem:   Acute kidney failure (HCC) Active Problems:   Hyperlipidemia   Hypertension   GERD (gastroesophageal reflux disease)   Atrial fibrillation   Warfarin toxicity   Alcohol abuse, in remission   Hepatic steatosis   Malnutrition of moderate degree   #) AKI: Unclear etiology but this appears to have now stabilized.  Patient is adamant that he does not want dialysis and that he wants to go home.  Will discuss further with nephrology and wife. -Nephrology following, appreciate recommendations they her going through an RPGN work-up that is thus far unrevealing - Hold nephrotoxins -IV fluids  #) Diarrhea: Continues having intermittent diarrhea. - Continue oral vancomycin started 04/19/2018 -We will order stool PCR  #) History of alcohol abuse: Per review of the chart patient is quite a heavy drinker until about 2 months ago.  Apparently the patient does not open to discussing his drinking and there is a concern for the wife safety.  Please do not discuss this in front of the patient.  #) Hypothyroidism: -Continue levothyroxine 50 mcg daily  #) Hypertension/paroxysmal atrial fibrillation: Patient was initially supratherapeutic -Warfarin per pharmacy consult -Hold diltiazem and metoprolol  Fluids: IV fluids. Electrolytes: Monitor and supplement Nutrition: Heart healthy diet  Prophylaxis: Supratherapeutic INR, warfarin  Disposition: Pending discussion with wife and nephrology and patient about goals of care.  DNR  Consultants:    Nephrology  Palliative care  Procedures:   None  Antimicrobials:   P.o. vancomycin started 04/19/2018   Subjective: Patient reports that his diarrhea continues to be an issue for him.  He is quite frustrated that he is stuck in the hospital and is eager to leave.  He adamantly insist that he does not want dialysis.  Objective: Vitals:   04/22/18 2353 04/23/18 0347 04/23/18 0930 04/23/18 0934  BP: 106/73 99/61 118/78 118/78  Pulse: (!) 36 67  96  Resp:      Temp: 98 F (36.7 C) (!) 97.4 F (36.3 C)  (!) 97.4 F (36.3 C)  TempSrc: Oral Oral  Oral  SpO2: 94% 94%  95%  Weight:  93.9 kg    Height:        Intake/Output Summary (Last 24 hours) at 04/23/2018 1120 Last data filed at 04/23/2018 0500 Gross per 24 hour  Intake 2803.4 ml  Output 1226 ml  Net 1577.4 ml   Filed Weights   04/21/18 0311 04/22/18 0416 04/23/18 0347  Weight: 92.5 kg 93.2 kg 93.9 kg    Examination:  General exam: Appears calm and comfortable  Respiratory system: Clear to auscultation. Respiratory effort normal. Cardiovascular system: Regular rate and rhythm, no murmurs Gastrointestinal system: Abdomen is nondistended, soft and nontender. No organomegaly or masses felt. Normal bowel sounds heard. Central nervous system: Alert and oriented.  Grossly intact, moving all extremities Extremities: Trace lower extremity edema Skin: No rashes over visible skin Psychiatry: Judgement and insight appear normal. Mood & affect appropriate.     Data Reviewed: I have personally reviewed following labs and imaging studies  CBC: Recent Labs  Lab 04/17/18 0356 04/18/18 0405 04/19/18 1540 04/20/18 0325 04/22/18 0867  04/23/18 0225  WBC 20.5*  21.2* 22.5* 20.9* 19.3* 23.8* 21.3*  NEUTROABS 18.2*  --   --   --  21.2*  --   HGB 11.7*  11.8* 11.1* 11.8* 11.8* 11.3* 11.1*  HCT 35.4*  34.6* 33.5* 35.6* 34.6* 33.3* 34.1*  MCV 91.7  91.5 92.5 92.0 91.8 91.7 91.7  PLT 328  341 331 329 342 287 505    Basic Metabolic Panel: Recent Labs  Lab 04/17/18 0826  04/19/18 0336 04/20/18 0325 04/21/18 0228 04/22/18 0233 04/23/18 0225  NA 135   < > 135 134* 136 135 134*  K 3.6   < > 3.2* 3.4* 3.5 3.3* 3.3*  CL 103   < > 103 103 103 101 99  CO2 20*   < > $R'22 22 24 25 25  'HM$ GLUCOSE 99   < > 93 79 83 95 98  BUN 80*   < > 76* 72* 66* 62* 63*  CREATININE 6.17*   < > 5.82* 5.37* 5.21* 4.98* 5.15*  CALCIUM 7.5*   < > 7.5* 7.5* 7.5* 7.3* 7.4*  MG 2.0  --   --   --   --  1.4* 2.1  PHOS 5.0*   < > 5.0* 4.4 4.2 3.9 4.5   < > = values in this interval not displayed.   GFR: Estimated Creatinine Clearance: 13.3 mL/min (A) (by C-G formula based on SCr of 5.15 mg/dL (H)). Liver Function Tests: Recent Labs  Lab 04/17/18 0826  04/19/18 0336 04/20/18 0325 04/21/18 0228 04/22/18 0233 04/23/18 0225  AST 36  --   --   --   --   --  34  ALT 30  --   --   --   --   --  25  ALKPHOS 69  --   --   --   --   --  78  BILITOT 2.6*  --   --   --   --   --  1.5*  PROT 7.5  --   --   --   --   --  7.3  ALBUMIN 1.6*   < > 1.5* 1.5* 1.4* 1.4* 1.4*   < > = values in this interval not displayed.   No results for input(s): LIPASE, AMYLASE in the last 168 hours. No results for input(s): AMMONIA in the last 168 hours. Coagulation Profile: Recent Labs  Lab 04/17/18 0356 04/18/18 0405 04/19/18 0336 04/23/18 0225  INR 1.64 1.92 2.60 3.15   Cardiac Enzymes: No results for input(s): CKTOTAL, CKMB, CKMBINDEX, TROPONINI in the last 168 hours. BNP (last 3 results) No results for input(s): PROBNP in the last 8760 hours. HbA1C: No results for input(s): HGBA1C in the last 72 hours. CBG: No results for input(s): GLUCAP in the last 168 hours. Lipid Profile: No results for input(s): CHOL, HDL, LDLCALC, TRIG, CHOLHDL, LDLDIRECT in the last 72 hours. Thyroid Function Tests: No results for input(s): TSH, T4TOTAL, FREET4, T3FREE, THYROIDAB in the last 72 hours. Anemia Panel: No results for input(s): VITAMINB12,  FOLATE, FERRITIN, TIBC, IRON, RETICCTPCT in the last 72 hours. Sepsis Labs: Recent Labs  Lab 04/17/18 0826  PROCALCITON 0.45  LATICACIDVEN 1.4    Recent Results (from the past 240 hour(s))  Urine Culture     Status: Abnormal   Collection Time: 04/14/18  3:48 PM  Result Value Ref Range Status   Specimen Description URINE, RANDOM  Final   Special Requests NONE  Final   Culture (A)  Final    <  10,000 COLONIES/mL INSIGNIFICANT GROWTH Performed at [facility], [place] [address], [place], [place] [address]    Report Status 04/15/2018 FINAL  Final  Culture, blood (x 2)     Status: None   Collection Time: 04/17/18  8:33 AM  Result Value Ref Range Status   Specimen Description BLOOD LEFT HAND  Final   Special Requests   Final    BOTTLES DRAWN AEROBIC ONLY Blood Culture adequate volume   Culture   Final    NO GROWTH 5 DAYS Performed at [facility], [place] [address], [place], [place] [address]    Report Status 04/22/2018 FINAL  Final  Culture, blood (x 2)     Status: None   Collection Time: 04/17/18  8:46 AM  Result Value Ref Range Status   Specimen Description BLOOD LEFT HAND  Final   Special Requests   Final    BOTTLES DRAWN AEROBIC ONLY Blood Culture results may not be optimal due to an inadequate volume of blood received in culture bottles   Culture   Final    NO GROWTH 5 DAYS Performed at [facility], [place] [address], [place], [place] [address]    Report Status 04/22/2018 FINAL  Final  C difficile quick scan w PCR reflex     Status: Abnormal   Collection Time: 04/19/18  8:52 AM  Result Value Ref Range Status   C Diff antigen POSITIVE (A) NEGATIVE Final   C Diff toxin NEGATIVE NEGATIVE Final   C Diff interpretation Results are indeterminate. See PCR results.  Final  C. Diff by PCR, Reflexed     Status: None   Collection Time: 04/19/18  8:52 AM  Result Value Ref Range Status   Toxigenic C. Difficile by PCR NEGATIVE NEGATIVE Final    Comment: Patient is  colonized with non toxigenic C. difficile. May not need treatment unless significant symptoms are present. Performed at [facility], [place] [address] [place], [place] [address]   Gastrointestinal Panel by PCR , Stool     Status: None   Collection Time: 04/23/18  3:17 AM  Result Value Ref Range Status   Campylobacter species NOT DETECTED NOT DETECTED Final   Plesimonas shigelloides NOT DETECTED NOT DETECTED Final   Salmonella species NOT DETECTED NOT DETECTED Final   Yersinia enterocolitica NOT DETECTED NOT DETECTED Final   Vibrio species NOT DETECTED NOT DETECTED Final   Vibrio cholerae NOT DETECTED NOT DETECTED Final   Enteroaggregative E coli (EAEC) NOT DETECTED NOT DETECTED Final   Enteropathogenic E coli (EPEC) NOT DETECTED NOT DETECTED Final   Enterotoxigenic E coli (ETEC) NOT DETECTED NOT DETECTED Final   Shiga like toxin producing E coli (STEC) NOT DETECTED NOT DETECTED Final   Shigella/Enteroinvasive E coli (EIEC) NOT DETECTED NOT DETECTED Final   Cryptosporidium NOT DETECTED NOT DETECTED Final   Cyclospora cayetanensis NOT DETECTED NOT DETECTED Final   Entamoeba histolytica NOT DETECTED NOT DETECTED Final   Giardia lamblia NOT DETECTED NOT DETECTED Final   Adenovirus F40/41 NOT DETECTED NOT DETECTED Final   Astrovirus NOT DETECTED NOT DETECTED Final   Norovirus GI/GII NOT DETECTED NOT DETECTED Final   Rotavirus A NOT DETECTED NOT DETECTED Final   Sapovirus (I, II, IV, and V) NOT DETECTED NOT DETECTED Final    Comment: Performed at [facility], [address] [place], [place] [address]         Radiology Studies: No results found.  Scheduled Meds: . diltiazem  360 mg Oral Daily  . famotidine  20 mg Oral Daily  . feeding supplement (ENSURE ENLIVE)  237 mL Oral BID BM  . feeding supplement (PRO-STAT SUGAR FREE 64)  30 mL Oral BID  . folic acid  1 mg Oral Daily  . levothyroxine  50 mcg Oral Q0600  . metoprolol tartrate  75 mg Oral BID  .  multivitamin with minerals  1 tablet Oral Daily  . saccharomyces boulardii  250 mg Oral BID  . sodium bicarbonate  650 mg Oral BID  . sodium chloride flush  3 mL Intravenous Q12H  . thiamine  100 mg Oral Daily  . vancomycin  125 mg Oral QID   Continuous Infusions:    LOS: 9 days    Time spent: [place], MD [facility]  If 7PM-7AM, please contact night-coverage [email or web address] Password [email or web address] 04/23/2018, 11:20 AM

## 2018-04-23 NOTE — Progress Notes (Signed)
Chief Complaint: Patient was seen in consultation today for renal biopsy at the request of Dr. Pearson Grippe  Referring Physician(s): Dr. Pearson Grippe  Supervising Physician: Marybelle Killings  Patient Status: [facility] - In-pt  History of Present Illness: Daniel Morse is a 78 y.o. male being worked up for renal insufficiency. His lab workup shows MPO ANCA antibody positive concerning for vasculitis. IR is asked to perform image guided random renal biopsy.  PMHx, meds, labs, imaging, allergies reviewed. Pt with hx of A. Fib on chronic Coumadin. Last INR 3.1 Per chart, Coumadin has been held. Vit K has been given and FFP has been ordered as well.  Pt seen, without specific comlaints   Past Medical History:  Diagnosis Date  . Atrial flutter ([place]) 06/2010  . Cerebrovascular disease 2008   Left CEA  . Chest pain    negative stress nuclear in 11/2002  . Chronic anticoagulation   . Dysrhythmia   . GERD (gastroesophageal reflux disease)    h/o esophagitis  . History of colonic polyps    Colonoscopy and polypectomy in 03/2011  . History of kidney stones   . Hyperlipidemia    Metabolic syndrome with increased triglycerides  . Hypertension   . Macular degeneration    legally blind  . Nephrolithiasis   . Peripheral vascular disease ([place])   . Tobacco abuse    50 pack years; 1/2 pack per day    Past Surgical History:  Procedure Laterality Date  . CAROTID ENDARTERECTOMY  2008   Left  . COLONOSCOPY  04/04/2011   Rogene Houston, MD;  multiple polyps ablated  . COLONOSCOPY N/A 06/12/2016   Procedure: COLONOSCOPY;  Surgeon: Rogene Houston, MD;  Location: [facility];  Service: Endoscopy;  Laterality: N/A;  1030  . CYSTOSCOPY W/ URETERAL STENT PLACEMENT     and stone extraction    Allergies: Statins  Medications:  Current Facility-Administered Medications:  .  diltiazem (CARDIZEM CD) 24 hr capsule 360 mg, 360 mg, Oral, Daily, Lavina Hamman, MD, 360 mg at 04/23/18 0930 .   famotidine (PEPCID) tablet 20 mg, 20 mg, Oral, Daily, Lavina Hamman, MD, 20 mg at 04/23/18 0935 .  feeding supplement (ENSURE ENLIVE) (ENSURE ENLIVE) liquid 237 mL, 237 mL, Oral, BID BM, Lavina Hamman, MD, 237 mL at 04/22/18 1446 .  feeding supplement (PRO-STAT SUGAR FREE 64) liquid 30 mL, 30 mL, Oral, BID, Purohit, Shrey C, MD, 30 mL at 04/22/18 1445 .  folic acid (FOLVITE) tablet 1 mg, 1 mg, Oral, Daily, Lady Deutscher, MD, 1 mg at 04/23/18 0936 .  guaiFENesin-dextromethorphan (ROBITUSSIN DM) 100-10 MG/5ML syrup 5 mL, 5 mL, Oral, Q4H PRN, Lavina Hamman, MD, 5 mL at 04/18/18 0244 .  levothyroxine (SYNTHROID, LEVOTHROID) tablet 50 mcg, 50 mcg, Oral, Q0600, Lady Deutscher, MD, 50 mcg at 04/23/18 0607 .  metoprolol tartrate (LOPRESSOR) tablet 75 mg, 75 mg, Oral, BID, Lavina Hamman, MD, 75 mg at 04/23/18 0934 .  multivitamin with minerals tablet 1 tablet, 1 tablet, Oral, Daily, Lady Deutscher, MD, 1 tablet at 04/23/18 0935 .  ondansetron (ZOFRAN) tablet 4 mg, 4 mg, Oral, Q6H PRN **OR** ondansetron (ZOFRAN) injection 4 mg, 4 mg, Intravenous, Q6H PRN, Lady Deutscher, MD .  oxyCODONE (Oxy IR/ROXICODONE) immediate release tablet 5 mg, 5 mg, Oral, Q4H PRN, Lady Deutscher, MD .  phytonadione (VITAMIN K) 10 mg in dextrose 5 % 50 mL IVPB, 10 mg, Intravenous, Once, Purohit, Shrey C,  MD, Last Rate: 50 mL/hr at 04/23/18 1400, 10 mg at 04/23/18 1400 .  saccharomyces boulardii (FLORASTOR) capsule 250 mg, 250 mg, Oral, BID, Lavina Hamman, MD, 250 mg at 04/23/18 0930 .  sodium bicarbonate tablet 650 mg, 650 mg, Oral, BID, Pearson Grippe B, MD, 650 mg at 04/23/18 0936 .  sodium chloride flush (NS) 0.9 % injection 3 mL, 3 mL, Intravenous, Q12H, Lady Deutscher, MD, 3 mL at 04/19/18 2147 .  thiamine (VITAMIN B-1) tablet 100 mg, 100 mg, Oral, Daily, 100 mg at 04/23/18 0935 **OR** [DISCONTINUED] thiamine (B-1) injection 100 mg, 100 mg, Intravenous, Daily, Evangeline Gula, Wyatt Haste, MD .  traZODone  (DESYREL) tablet 50 mg, 50 mg, Oral, QHS PRN, Lady Deutscher, MD .  vancomycin (VANCOCIN) 50 mg/mL oral solution 125 mg, 125 mg, Oral, QID, Romona Curls, RPH, 125 mg at 04/23/18 1400    Family History  Problem Relation Age of Onset  . Heart attack Father   . Hypertension Sister   . Heart attack Brother   . Colon cancer Paternal Uncle   . Heart attack Brother     Social History   Socioeconomic History  . Marital status: Married    Spouse name: Not on file  . Number of children: Not on file  . Years of education: Not on file  . Highest education level: Not on file  Occupational History  . Not on file  Social Needs  . Financial resource strain: Not on file  . Food insecurity:    Worry: Not on file    Inability: Not on file  . Transportation needs:    Medical: Not on file    Non-medical: Not on file  Tobacco Use  . Smoking status: Former Smoker    Packs/day: 0.50    Years: 50.00    Pack years: 25.00    Types: Cigarettes    Start date: 11/06/1978    Last attempt to quit: 11/05/2009    Years since quitting: 8.4  . Smokeless tobacco: Former Systems developer    Quit date: 06/01/2010  Substance and Sexual Activity  . Alcohol use: Yes    Alcohol/week: 20.0 standard drinks    Types: 20 Shots of liquor per week    Comment: 1/2 gallon of vodka (as much as possible) quit 8 weeks ago  . Drug use: No  . Sexual activity: Not on file  Lifestyle  . Physical activity:    Days per week: Not on file    Minutes per session: Not on file  . Stress: Not on file  Relationships  . Social connections:    Talks on phone: Not on file    Gets together: Not on file    Attends religious service: Not on file    Active member of club or organization: Not on file    Attends meetings of clubs or organizations: Not on file    Relationship status: Not on file  Other Topics Concern  . Not on file  Social History Narrative  . Not on file     Review of Systems: A 12 point ROS discussed and pertinent  positives are indicated in the HPI above.  All other systems are negative.  Review of Systems  Vital Signs: BP 118/78 (BP Location: Left Arm)   Pulse 96   Temp (!) 97.4 F (36.3 C) (Oral)   Resp 20   Ht $R'5\' 6"'er$  (1.676 m)   Wt 93.9 kg Comment: Scale A  SpO2 95%  BMI 33.43 kg/m   Physical Exam HENT:     Mouth/Throat:     Mouth: Mucous membranes are moist.     Pharynx: Oropharynx is clear.  Cardiovascular:     Rate and Rhythm: Normal rate and regular rhythm.     Heart sounds: Normal heart sounds.  Pulmonary:     Effort: Pulmonary effort is normal. No respiratory distress.     Breath sounds: Normal breath sounds.  Abdominal:     General: There is no distension.  Neurological:     General: No focal deficit present.     Mental Status: He is alert and oriented to person, place, and time.  Psychiatric:        Mood and Affect: Mood normal.        Judgment: Judgment normal.     Imaging: Dg Chest 2 View  Result Date: 04/06/2018 CLINICAL DATA:  Dry cough 6 weeks. EXAM: CHEST - 2 VIEW COMPARISON:  Chest x-ray FINDINGS: Bilateral densities are stable, presumably pleural. Nodular densities are present upper lobes stable. Superimposed airspace disease is present. Heart is mildly enlarged.  There is no edema or effusion. IMPRESSION: No active cardiopulmonary disease. Electronically Signed   By: San Morelle M.D.   On: 04/06/2018 12:54   Ct Head Wo Contrast  Result Date: 04/14/2018 CLINICAL DATA:  Elevated INR with altered level of consciousness and confusion. EXAM: CT HEAD WITHOUT CONTRAST TECHNIQUE: Contiguous axial images were obtained from the base of the skull through the vertex without intravenous contrast. COMPARISON:  Head CT 05/10/2012 FINDINGS: Brain: Stable involutional changes of brain with chronic microvascular ischemic disease of white matter. No acute large vascular territory infarction, hemorrhage or mass. No mass effect. No hydrocephalus. Midline fourth ventricle and  basal cisterns. Brainstem and cerebellum are nonacute. Vascular: Atherosclerosis of the carotid siphons. Skull: Intact without suspicious osseous lesions or fracture. Sinuses/Orbits: Intact Other: None IMPRESSION: No acute intracranial abnormality. 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 Electronically Signed   By: Ashley Royalty M.D.   On: 04/14/2018 16:48   US Abdomen Complete  Result Date: 04/03/2018 CLINICAL DATA:  Elevated liver enzymes. EXAM: ABDOMEN ULTRASOUND COMPLETE COMPARISON:  CT abdomen and pelvis 02/23/2014 FINDINGS: Gallbladder: No gallstones or wall thickening visualized. No sonographic Murphy sign noted by sonographer. Common bile duct: Diameter: 4 mm Liver: Mildly increased parenchymal echogenicity diffusely without focal abnormality identified. Portal vein is patent on color Doppler imaging with normal direction of blood flow towards the liver. IVC: No abnormality visualized. Pancreas: Visualized portion unremarkable. Spleen: Size and appearance within normal limits. Right Kidney: Length: 11.4 cm. Echogenicity within normal limits. No mass or hydronephrosis visualized. Left Kidney: Length: 11.3 cm. Echogenicity within normal limits. No hydronephrosis. 2.1 cm interpolar cystr. Abdominal aorta: 2.9 cm diameter proximally without focal aneurysm an with normal diameter more distally. Other findings: None. IMPRESSION: 1. Mildly echogenic liver likely reflecting steatosis. 2. No gallstones or biliary dilatation. 3. 2.1 cm left renal cyst. Electronically Signed   By: Logan Bores M.D.   On: 04/03/2018 14:22   Dg Chest Port 1 View  Result Date: 04/17/2018 CLINICAL DATA:  Leukocytosis.  Hypertension. EXAM: PORTABLE CHEST 1 VIEW COMPARISON:  April 06, 2018 FINDINGS: There is scarring along the left base. There are apparent calcified pleural plaques bilaterally. Calcification is also seen along the left pleural surface. There is no edema or consolidation. Heart is upper normal in size  with pulmonary vascularity normal. No adenopathy. There is aortic atherosclerosis. No bone lesions. IMPRESSION: Apparent  pleural plaques and left pleural calcification. These are changes likely indicative of previous asbestos exposure. Mild scarring left base. No edema or consolidation evident. Stable cardiac silhouette. Aortic Atherosclerosis (ICD10-I70.0). Electronically Signed   By: Lowella Grip III M.D.   On: 04/17/2018 09:33   Ct Renal Stone Study  Result Date: 04/14/2018 CLINICAL DATA:  Acute renal failure EXAM: CT ABDOMEN AND PELVIS WITHOUT CONTRAST TECHNIQUE: Multidetector CT imaging of the abdomen and pelvis was performed following the standard protocol without IV contrast. COMPARISON:  02/23/2014 FINDINGS: Lower chest: Calcified pleural plaque is identified at each lung base consistent prior asbestos exposure. Coronary arteriosclerosis of the included heart is noted without pericardial effusion or thickening. Hepatobiliary: Calcified granuloma in the right hepatic lobe. The unenhanced liver is unremarkable given limitations of a noncontrast study. No biliary dilatation is noted. The gallbladder is normal in appearance. Pancreas: Normal Spleen: Normal Adrenals/Urinary Tract: Normal bilateral adrenal glands. Nonspecific mild perinephric fat stranding with renovascular calcifications bilaterally. No obstructive uropathy, renal mass or nephrolithiasis. Stable 14 mm cystic focus off the lower pole the left kidney with associated mild renal cortical scarring. Stomach/Bowel: Stomach is within normal limits. Small hiatal hernia. Appendix appears normal. No evidence of bowel wall thickening, distention, or inflammatory changes. Scattered colonic diverticulosis without acute diverticulitis is identified. Vascular/Lymphatic: Aortoiliac atherosclerosis. No adenopathy by CT size criteria. Reproductive: Unremarkable prostate and seminal vesicles. Other: No free air or free fluid. Musculoskeletal: No acute or  significant osseous findings. IMPRESSION: 1. Calcified pleural plaque at each lung base consistent with prior asbestos exposure. 2. Stable 14 mm cystic focus off the lower pole the left kidney with associated renal cortical scarring. No nephrolithiasis nor obstructive uropathy. No CT findings to explain the patient's renal failure. 3. Colonic diverticulosis without acute diverticulitis. Aortic Atherosclerosis (ICD10-I70.0). Electronically Signed   By: Ashley Royalty M.D.   On: 04/14/2018 16:59   US Scrotum W/doppler  Result Date: 04/06/2018 CLINICAL DATA:  Left testicular swelling. EXAM: SCROTAL ULTRASOUND DOPPLER ULTRASOUND OF THE TESTICLES TECHNIQUE: Complete ultrasound examination of the testicles, epididymis, and other scrotal structures was performed. Color and spectral Doppler ultrasound were also utilized to evaluate blood flow to the testicles. COMPARISON:  None. FINDINGS: Right testicle Measurements: 3.6 x 2.2 x 1.4 cm. No mass or microlithiasis visualized. 3 mm cyst is noted. Left testicle Measurements: 4.0 x 2.1 x 1.8 cm 4 mm septated cyst is noted. No mass or microlithiasis visualized. Right epididymis:  Normal in size and appearance. Left epididymis: Increased vascularity of left epididymis is noted suggesting epididymitis. Hydrocele:  Large multi-septated complex left hydrocele is noted. Varicocele:  None visualized. Pulsed Doppler interrogation of both testes demonstrates normal low resistance arterial and venous waveforms bilaterally. IMPRESSION: No definite evidence of testicular torsion. Small complex cysts are noted in both testicles. Left epididymis is enlarged and hypervascular suggesting epididymitis. Large multi-septated complex left hydrocele is noted which may be inflammatory in etiology. Electronically Signed   By: Marijo Conception, M.D.   On: 04/06/2018 13:31    Labs:  CBC: Recent Labs    04/19/18 0336 04/20/18 0325 04/22/18 0233 04/23/18 0225  WBC 20.9* 19.3* 23.8* 21.3*  HGB  11.8* 11.8* 11.3* 11.1*  HCT 35.6* 34.6* 33.3* 34.1*  PLT 329 342 287 263    COAGS: Recent Labs    04/14/18 1023 04/14/18 1323  04/17/18 0356 04/18/18 0405 04/19/18 0336 04/23/18 0225  INR 9.01* 9.16*   < > 1.64 1.92 2.60 3.15  APTT 78* 79*  --   --   --   --   --    < > =  values in this interval not displayed.    BMP: Recent Labs    04/20/18 0325 04/21/18 0228 04/22/18 0233 04/23/18 0225  NA 134* 136 135 134*  K 3.4* 3.5 3.3* 3.3*  CL 103 103 101 99  CO2 $Re'22 24 25 25  'jGq$ GLUCOSE 79 83 95 98  BUN 72* 66* 62* 63*  CALCIUM 7.5* 7.5* 7.3* 7.4*  CREATININE 5.37* 5.21* 4.98* 5.15*  GFRNONAA 10* 10* 11* 10*  GFRAA 11* 12* 12* 12*    LIVER FUNCTION TESTS: Recent Labs    04/14/18 1323 04/15/18 0643  04/17/18 0826  04/20/18 0325 04/21/18 0228 04/22/18 0233 04/23/18 0225  BILITOT 1.7* 2.4*  --  2.6*  --   --   --   --  1.5*  AST 50* 51*  --  36  --   --   --   --  34  ALT 40 41  --  30  --   --   --   --  25  ALKPHOS 71 84  --  69  --   --   --   --  78  PROT 8.1 8.6*  --  7.5  --   --   --   --  7.3  ALBUMIN 1.8* 2.0*   < > 1.6*   < > 1.5* 1.4* 1.4* 1.4*   < > = values in this interval not displayed.    TUMOR MARKERS: No results for input(s): AFPTM, CEA, CA199, CHROMGRNA in the last 8760 hours.  Assessment and Plan: Renal failure ANCA vasculitis For US guided random renal biopsy. Tentatively plan for bx tomorrow if INR comes down to 1.5 or less. Will make NPO p MN Risks and benefits of renal biopsy was discussed with the patient and/or patient's family including, but not limited to bleeding, infection, damage to adjacent structures or low yield requiring additional tests.  All of the questions were answered and there is agreement to proceed.  Consent signed and in chart.    Thank you for this interesting consult.  I greatly enjoyed meeting Daniel Morse and look forward to participating in their care.  A copy of this report was sent to the requesting  provider on this date.  Electronically Signed: Ascencion Dike, PA-C 04/23/2018, 1:48 PM   I spent a total of 20 minutes in face to face in clinical consultation, greater than 50% of which was counseling/coordinating care for renal biopsy

## 2018-04-23 NOTE — Progress Notes (Addendum)
[facility] Progress Note   Subjective:      Creatinine slightly worsened this morning at 5.1.  Urine output 1.4 L  MPO ANCA antibody is positive   GBM antibodies negative  C3 and C4 wnl  sFLC K:L ratio is consistent with low GFR state  ANCA, GBM Ab pending  ANA neg; SPEP w/o M Spike  UP/C 0.25  Results for Daniel Morse, Daniel Morse (MRN [id number]) as of 04/23/2018 11:40  Ref. Range 04/20/2018 03:25  ANCA Proteinase 3 Latest Ref Range: 0.0 - 3.5 U/mL 4.1 (H)  GBM Ab Latest Ref Range: 0 - 20 units 3  Myeloperoxidase Abs Latest Ref Range: 0.0 - 9.0 U/mL 62.1 (H)  Cytoplasmic (C-ANCA) Latest Ref Range: Neg:<1:20 titer <1:20  P-ANCA Latest Ref Range: Neg:<1:20 titer 1:640  Atypical P-ANCA titer Latest Ref Range: Neg:<1:20 titer <1:20    Objective Vitals:   04/22/18 2353 04/23/18 0347 04/23/18 0930 04/23/18 0934  BP: 106/73 99/61 118/78 118/78  Pulse: (!) 36 67  96  Resp:      Temp: 98 F (36.7 C) (!) 97.4 F (36.3 C)  (!) 97.4 F (36.3 C)  TempSrc: Oral Oral  Oral  SpO2: 94% 94%  95%  Weight:  93.9 kg    Height:       Physical Exam General: elderly man lying in bed in no distress Eyes: conjunctiva injected BL, L eye blind ENT: MMM Heart: tachy, irreg Lungs: normal WOB Abdomen: soft, nontender Extremities: no edema Skin: no rashes  Additional Objective Labs: Basic Metabolic Panel: Recent Labs  Lab 04/21/18 0228 04/22/18 0233 04/23/18 0225  NA 136 135 134*  K 3.5 3.3* 3.3*  CL 103 101 99  CO2 $Re'24 25 25  'jQC$ GLUCOSE 83 95 98  BUN 66* 62* 63*  CREATININE 5.21* 4.98* 5.15*  CALCIUM 7.5* 7.3* 7.4*  PHOS 4.2 3.9 4.5   Liver Function Tests: Recent Labs  Lab 04/17/18 0826  04/21/18 0228 04/22/18 0233 04/23/18 0225  AST 36  --   --   --  34  ALT 30  --   --   --  25  ALKPHOS 69  --   --   --  78  BILITOT 2.6*  --   --   --  1.5*  PROT 7.5  --   --   --  7.3  ALBUMIN 1.6*   < > 1.4* 1.4* 1.4*   < > = values in this interval not displayed.    No results for input(s): LIPASE, AMYLASE in the last 168 hours. CBC: Recent Labs  Lab 04/17/18 0356 04/18/18 0405 04/19/18 0336 04/20/18 0325 04/22/18 0233 04/23/18 0225  WBC 20.5*  21.2* 22.5* 20.9* 19.3* 23.8* 21.3*  NEUTROABS 18.2*  --   --   --  21.2*  --   HGB 11.7*  11.8* 11.1* 11.8* 11.8* 11.3* 11.1*  HCT 35.4*  34.6* 33.5* 35.6* 34.6* 33.3* 34.1*  MCV 91.7  91.5 92.5 92.0 91.8 91.7 91.7  PLT 328  341 331 329 342 287 263   Blood Culture    Component Value Date/Time   SDES BLOOD LEFT HAND 04/17/2018 0846   SPECREQUEST  04/17/2018 0846    BOTTLES DRAWN AEROBIC ONLY Blood Culture results may not be optimal due to an inadequate volume of blood received in culture bottles   CULT  04/17/2018 0846    NO GROWTH 5 DAYS Performed at [facility], [place] [address], [place], [place] [address]  REPTSTATUS 04/22/2018 FINAL 04/17/2018 0846    Cardiac Enzymes: No results for input(s): CKTOTAL, CKMB, CKMBINDEX, TROPONINI in the last 168 hours. CBG: No results for input(s): GLUCAP in the last 168 hours. Iron Studies: No results for input(s): IRON, TIBC, TRANSFERRIN, FERRITIN in the last 72 hours. $RemoveB'@lablastinr3'OTSJrguk$ @ Studies/Results: No results found. Medications:  . diltiazem  360 mg Oral Daily  . famotidine  20 mg Oral Daily  . feeding supplement (ENSURE ENLIVE)  237 mL Oral BID BM  . feeding supplement (PRO-STAT SUGAR FREE 64)  30 mL Oral BID  . folic acid  1 mg Oral Daily  . levothyroxine  50 mcg Oral Q0600  . metoprolol tartrate  75 mg Oral BID  . multivitamin with minerals  1 tablet Oral Daily  . saccharomyces boulardii  250 mg Oral BID  . sodium bicarbonate  650 mg Oral BID  . sodium chloride flush  3 mL Intravenous Q12H  . thiamine  100 mg Oral Daily  . vancomycin  125 mg Oral QID    2/11 CT:  Noting - Nonspecific mild perinephric fat stranding with renovascular calcifications bilaterally. No obstructive uropathy, renal mass or nephrolithiasis. Stable 14  mm cystic focus off the lower pole the left kidney with associated mild renal cortical scarring.  Assessment/Plan: 1.  AKI, severe, nonoliguric:  Baseline normal 03/2017, we don't have interval labs.  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  Discussed with patient that if ANCA vasculitis is confirmed he could potentially receive therapies to treat his renal failure.  He was willing to move forward with renal biopsy, will try to arrange for 2/21.  Is anticoagulated with warfarin and will discuss with hospitalist reversal.  Biopsy ordered with radiology.. Microscopic Hematuria present, minor proteinuria with UP/C 0.28.    2.  Leukocytosis: unclear etiology. Afebrile, no localizing s/s infection.  Hb was normal on presentation, now 11.8, Plt count normal.  I  3.  Transaminitis: following with hepatology (Dr. Laural Golden), liver biopsy aborted due to INR 9, though his transaminitis is now resolved.  Inflammatory markers were checked which were elevated and there is a suspicion for autoimmune disease.    4.  Atrial fibrillation:  Per primary.   5.  Metabolic acidosis:  Cont na bicarb 650  BID. Bicarb >20 today.   Pearson Grippe MD

## 2018-04-24 ENCOUNTER — Inpatient Hospital Stay (HOSPITAL_COMMUNITY): Payer: Medicare HMO

## 2018-04-24 ENCOUNTER — Other Ambulatory Visit (HOSPITAL_COMMUNITY): Admission: RE | Admit: 2018-04-24 | Payer: Medicare HMO | Source: Ambulatory Visit

## 2018-04-24 DIAGNOSIS — I776 Arteritis, unspecified: Secondary | ICD-10-CM

## 2018-04-24 LAB — RENAL FUNCTION PANEL
Albumin: 1.7 g/dL — ABNORMAL LOW (ref 3.5–5.0)
Anion gap: 13 (ref 5–15)
BUN: 60 mg/dL — ABNORMAL HIGH (ref 8–23)
CHLORIDE: 98 mmol/L (ref 98–111)
CO2: 23 mmol/L (ref 22–32)
Calcium: 7.6 mg/dL — ABNORMAL LOW (ref 8.9–10.3)
Creatinine, Ser: 5.04 mg/dL — ABNORMAL HIGH (ref 0.61–1.24)
GFR calc Af Amer: 12 mL/min — ABNORMAL LOW (ref >60)
GFR calc non Af Amer: 10 mL/min — ABNORMAL LOW (ref >60)
Glucose, Bld: 84 mg/dL (ref 70–99)
Phosphorus: 5.2 mg/dL — ABNORMAL HIGH (ref 2.5–4.6)
Potassium: 3.3 mmol/L — ABNORMAL LOW (ref 3.5–5.1)
Sodium: 134 mmol/L — ABNORMAL LOW (ref 135–145)

## 2018-04-24 LAB — PREPARE FRESH FROZEN PLASMA: Unit division: 0

## 2018-04-24 LAB — BPAM FFP
Blood Product Expiration Date: 202002202359
Blood Product Expiration Date: 202002202359
ISSUE DATE / TIME: 202002202020
ISSUE DATE / TIME: 202002202216
Unit Type and Rh: 6200
Unit Type and Rh: 6200

## 2018-04-24 LAB — PROTIME-INR
INR: 1.64
Prothrombin Time: 19.2 seconds — ABNORMAL HIGH (ref 11.4–15.2)

## 2018-04-24 LAB — CBC
HCT: 33.8 % — ABNORMAL LOW (ref 39.0–52.0)
Hemoglobin: 11.1 g/dL — ABNORMAL LOW (ref 13.0–17.0)
MCH: 30.5 pg (ref 26.0–34.0)
MCHC: 32.8 g/dL (ref 30.0–36.0)
MCV: 92.9 fL (ref 80.0–100.0)
Platelets: 279 10*3/uL (ref 150–400)
RBC: 3.64 MIL/uL — ABNORMAL LOW (ref 4.22–5.81)
RDW: 14.4 % (ref 11.5–15.5)
WBC: 19.6 K/uL — ABNORMAL HIGH (ref 4.0–10.5)
nRBC: 0 % (ref 0.0–0.2)

## 2018-04-24 MED ORDER — HYDROCODONE-ACETAMINOPHEN 5-325 MG PO TABS: 1.0000 | ORAL_TABLET | ORAL | Status: DC | PRN

## 2018-04-24 MED ORDER — MIDAZOLAM HCL 2 MG/2ML IJ SOLN
INTRAMUSCULAR | Status: AC
  Filled 2018-04-24: qty 2

## 2018-04-24 MED ORDER — SODIUM CHLORIDE 0.9 % IV SOLN
500.0000 mg | Freq: Every day | INTRAVENOUS | Status: AC
  Administered 2018-04-24 – 2018-04-26 (×3): 500 mg via INTRAVENOUS
  Filled 2018-04-24 (×3): qty 4

## 2018-04-24 MED ORDER — MIDAZOLAM HCL 2 MG/2ML IJ SOLN
INTRAMUSCULAR | Status: AC | PRN
  Administered 2018-04-24: 1 mg via INTRAVENOUS

## 2018-04-24 MED ORDER — FENTANYL CITRATE (PF) 100 MCG/2ML IJ SOLN
INTRAMUSCULAR | Status: AC | PRN
  Administered 2018-04-24: 25 ug via INTRAVENOUS

## 2018-04-24 MED ORDER — LIDOCAINE HCL (PF) 1 % IJ SOLN
INTRAMUSCULAR | Status: AC
  Filled 2018-04-24: qty 30

## 2018-04-24 MED ORDER — FENTANYL CITRATE (PF) 100 MCG/2ML IJ SOLN
INTRAMUSCULAR | Status: AC
  Filled 2018-04-24: qty 2

## 2018-04-24 MED ORDER — POTASSIUM CHLORIDE CRYS ER 20 MEQ PO TBCR
40.0000 meq | EXTENDED_RELEASE_TABLET | Freq: Once | ORAL | Status: AC
  Administered 2018-04-24: 40 meq via ORAL
  Filled 2018-04-24: qty 2

## 2018-04-24 NOTE — Progress Notes (Signed)
Daily Progress Note   Patient Name: Daniel Morse       Date: 04/24/2018 DOB: 1940/11/20  Age: 78 y.o. MRN#: [id number] Attending Physician: Cristy Folks, MD Primary Care Physician: Sharilyn Sites, MD Admit Date: 04/14/2018  Reason for Consultation/Follow-up: Establishing goals of care  Subjective: Patient in bed. Awake, A&O x3. No family at bedside. He denied pain or shortness of breath. Recently returned to room from biopsy. No pain or discomfort at site. Patient expressed his appreciation of testing and states, he is open to other treatments outside of HD. States he and his wife continues to remain hopeful.   He continues to agree with outpatient palliative for support. States he is not sure what the end-result is going to be but he is feeling better and happy about it. Spent time discussing plans once he is discharge (which he hopes is the weekend) and watching NASCAR.    Length of Stay: 10  Current Medications: Scheduled Meds:  . diltiazem  360 mg Oral Daily  . famotidine  20 mg Oral Daily  . feeding supplement (ENSURE ENLIVE)  237 mL Oral BID BM  . feeding supplement (PRO-STAT SUGAR FREE 64)  30 mL Oral BID  . folic acid  1 mg Oral Daily  . levothyroxine  50 mcg Oral Q0600  . metoprolol tartrate  75 mg Oral BID  . multivitamin with minerals  1 tablet Oral Daily  . saccharomyces boulardii  250 mg Oral BID  . sodium bicarbonate  650 mg Oral BID  . sodium chloride flush  3 mL Intravenous Q12H  . thiamine  100 mg Oral Daily  . vancomycin  125 mg Oral QID    Continuous Infusions:   PRN Meds: guaiFENesin-dextromethorphan, HYDROcodone-acetaminophen, ondansetron **OR** ondansetron (ZOFRAN) IV, oxyCODONE, traZODone  Physical Exam Vitals signs and nursing note reviewed.    Constitutional:      General: He is awake.     Appearance: He is well-developed. He is ill-appearing.  Eyes:     Comments: Cornea redness bilaterally   Cardiovascular:     Rate and Rhythm: Tachycardia present.     Pulses: Normal pulses.     Heart sounds: Normal heart sounds.  Pulmonary:     Effort: Pulmonary effort is normal.     Breath sounds: Decreased breath sounds present.  Abdominal:     General: Bowel sounds are normal.     Palpations: Abdomen is soft.     Comments: S/p  Biopsy   Skin:    General: Skin is warm and dry.     Findings: Bruising present.  Neurological:     Mental Status: He is alert and oriented to person, place, and time.  Psychiatric:        Behavior: Behavior is cooperative.             Vital Signs: BP 97/69 (BP Location: Left Arm)   Pulse (!) 108   Temp 97.7 F (36.5 C) (Oral)   Resp 20   Ht $R'5\' 6"'tJ$  (1.676 m)   Wt 93.4 kg   SpO2 96%   BMI 33.23 kg/m  SpO2: SpO2: 96 % O2 Device: O2 Device: Room Air O2 Flow Rate: O2 Flow Rate (L/min): 2 L/min  Intake/output summary:   Intake/Output Summary (Last 24 hours) at 04/24/2018 1218 Last data filed at 04/24/2018 1113 Gross per 24 hour  Intake 482.33 ml  Output 800 ml  Net -317.67 ml   LBM: Last BM Date: 04/23/18 Baseline Weight: Weight: 90.3 kg(scale a) Most recent weight: Weight: 93.4 kg       Palliative Assessment/Data:PPS 40%    Flowsheet Rows     Most Recent Value  Intake Tab  Referral Department  Hospitalist  Unit at Time of Referral  ER  Date Notified  04/15/18  Palliative Care Type  New Palliative care  Reason for referral  Clarify Goals of Care  Date of Admission  04/14/18  Date first seen by Palliative Care  04/16/18  # of days Palliative referral response time  1 Day(s)  # of days IP prior to Palliative referral  1  Clinical Assessment  Psychosocial & Spiritual Assessment  Palliative Care Outcomes      Patient Active Problem List   Diagnosis Date Noted  . Malnutrition  of moderate degree 04/15/2018  . Warfarin toxicity 04/14/2018  . Acute kidney failure ([place]) 04/14/2018  . 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 Hepatic steatosis 04/14/2018  . Perirectal abscess   . Family hx of colon cancer 04/19/2016  . History of colonic polyps 04/19/2016  . Encounter for therapeutic drug monitoring 04/14/2013  . Hypokalemia 12/03/2012  . Peripheral vascular disease ([place]) 11/05/2012  . Tobacco abuse   . Cerebrovascular disease   . Nephrolithiasis   . Hyperlipidemia   . Hypertension   . GERD (gastroesophageal reflux disease)   . Atrial fibrillation     Palliative Care Assessment & Plan   Patient Profile: 78 y.o. male  admitted on 04/14/2018 from home after presenting for a liver biopsy and INR found to be 9. He has a past medical history of atrial fibrillation (Coumadin), GERD, hypertension, hyperlipidemia, macular degeneration (legally blind), peripheral vascular disease, and kidney stones. During his ED course he denied bleeding, chest pain, or shortness of breath. Family also reports patient is a heavy alcohol drinker however, does not wish for [place] to disclose to patient that we are aware as he reportedly quit about 8 weeks ago. 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 Previous ultrasound of scrotum showed epididymitis. Since admission patient is followed by Nephrology, no recommendations for  HD at this time. He has shown some minor improvement with kidney function, watchful waiting in regards to kidney function. Palliative Medicine team consulted for goals of care.   Recommendations/Plan:  DNR/DNI  Continue to treat  Patient is hopeful for improvement and awareness there may be other options for  treatment, as he continues to express his wishes of not pursuing HD  Community based palliative at discharge  PMT will continue to support and follow as needed.   Goals of Care and Additional Recommendations:  Limitations on Scope of Treatment: Full Scope Treatment, No Hemodialysis and No Tracheostomy, no escalation of care  Code Status:    Code Status Orders  (From admission, onward)         Start     Ordered   04/16/18 1437  Do not attempt resuscitation (DNR)  Continuous    Question Answer Comment  In the event of cardiac or respiratory ARREST Do not call a "code blue"   In the event of cardiac or respiratory ARREST Do not perform Intubation, CPR, defibrillation or ACLS   In the event of cardiac or respiratory ARREST Use medication by any route, position, wound care, and other measures to relive pain and suffering. May use oxygen, suction and manual treatment of airway obstruction as needed for comfort.      04/16/18 1436        Code Status History    Date Active Date Inactive Code Status Order ID Comments User Context   04/14/2018 1856 04/16/2018 1436 Full Code [id number]  Lady Deutscher, MD ED      Prognosis:   Guarded to Poor   Discharge Planning:  To Be Determined  Care plan was discussed with patient and bedside RN.   Thank you for allowing the Palliative Medicine Team to assist in the care of this patient.   Total Time 25 min.   Prolonged Time Billed NO       Greater than 50%  of this time was spent counseling and coordinating care related to the above assessment and plan.  Alda Lea, AGPCNP-BC Palliative Medicine Team  Pager: [phone] Amion: Bjorn Pippin   Please contact Palliative Medicine Team phone at [phone] for questions and concerns.

## 2018-04-24 NOTE — Procedures (Signed)
  Procedure: [place] core [employer] renal 16g x2 EBL:   minimal Complications:  none immediate  See full dictation in [facility].  Dillard Cannon MD Main # [phone] Pager  [phone]

## 2018-04-24 NOTE — Progress Notes (Signed)
[place] for warfarin Indication: atrial fibrillation  Allergies  Allergen Reactions  . Statins Swelling    Legs hurt   Labs: Recent Labs    04/22/18 0233 04/23/18 0225 04/24/18 0533  HGB 11.3* 11.1* 11.1*  HCT 33.3* 34.1* 33.8*  PLT 287 263 279  LABPROT  --  31.9* 19.2*  INR  --  3.15 1.64  CREATININE 4.98* 5.15* 5.04*    Estimated Creatinine Clearance: 13.5 mL/min (A) (by C-G formula based on SCr of 5.04 mg/dL (H)).   Assessment: [name] on warfarin PTA for atrial fibrillation. Dose PTA 3.75 mg on Tues, Thurs, and Saturdays; 2.5 mg on all other days of the week. Was instructed by Dr. Laural Golden to hold warfarin on 2/5 with elevated LFTs, weakness, and poor appetite.  INR on admission was elevated at 9.01 despite holding warfarin. S/p vitamin K 5 mg IV x 1 on 2/11, $Remove'10mg'tKmzejC$  vit k on 2/20 along with 2 units of FFP.   INR trended down this morning to 1.64 in anticipation of renal biopsy that has been completed. D/w attending this morning, will hold warfarin tonight and restart tomorrow 2/22.   Goal of Therapy:  INR 2-3 Monitor platelets by anticoagulation protocol: Yes   Plan:  Restart warfarin 2/22  Erin Hearing PharmD., BCPS Clinical Pharmacist 04/24/2018 11:17 AM

## 2018-04-24 NOTE — Progress Notes (Signed)
PROGRESS NOTE    Daniel Morse  TIW:[id number] DOB: Jul 04, 1940 DOA: 04/14/2018 PCP: Sharilyn Sites, MD   Brief Narrative:  78 year old with past medical history relevant for hypothyroidism, hyperlipidemia, paroxysmal atrial fibrillation on warfarin, hypertension, history of alcohol abuse in remission who presented on 04/14/2018 with elevated INR and was admitted for AKI and significant diarrhea.  RPGN work-up showed that patient was positive for MPO antibodies.   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  He however did have his MPO ANCA come back positive is and he is agreeable to treatment. -Biopsy today with interventional radiology, -Nephrology following, they will initiate treatment after biopsy - Hold nephrotoxins  #) Diarrhea: Resolved.  C. difficile antigen was positive but toxin was negative.  PCR was negative. - Continue oral vancomycin started 04/19/2018 -GI panel stool PCR was negative  #) Hypertension/paroxysmal atrial fibrillation: Kidney biopsy patient was given 10 units of IV vitamin K as well as 2 units of FFP with improvement in INR -Warfarin per pharmacy consult, restart -Hold diltiazem and metoprolol  #) History of alcohol abuse: Per review of the chart patient is quite a heavy drinker until about 2 months ago.  Apparently the patient does not open to discussing his drinking and there is a concern for the wife safety.  Please do not discuss this in front of the patient.  #) Hypothyroidism: -Continue levothyroxine 50 mcg  daily  Fluids: I tolerating p.o. Electrolytes: Monitor and supplement Nutrition: Heart healthy diet  Prophylaxis: Warfarin  Disposition: Initiation of treatment for MPO ANCA DNR  Consultants:   Nephrology  Palliative care  Procedures:   None  Antimicrobials:   P.o. vancomycin started 04/19/2018   Subjective: Patient reports that his mood is much improved.  He is eager that he has a possible treatable problem.  He denies any nausea, vomiting, diarrhea, cough, congestion, rhinorrhea.  Objective: Vitals:   04/24/18 0955 04/24/18 1010 04/24/18 1015 04/24/18 1020  BP: 123/74 125/76 125/84 118/90  Pulse: 100 92 (!) 102 98  Resp: (!) 22 (!) 22 (!) 21 (!) 21  Temp:      TempSrc:      SpO2: 96% 98% 97% 95%  Weight:      Height:        Intake/Output Summary (Last 24 hours) at 04/24/2018 1030 Last data filed at 04/24/2018 0800 Gross per 24 hour  Intake 479.33 ml  Output 400 ml  Net 79.33 ml   Filed Weights   04/22/18 0416 04/23/18 0347 04/24/18 0043  Weight: 93.2 kg 93.9 kg 93.4 kg    Examination:  General exam: Appears calm and comfortable  Respiratory system: Clear to auscultation. Respiratory effort normal. Cardiovascular system: Regular rate and rhythm, no murmurs Gastrointestinal system: Abdomen is nondistended, soft and nontender. No organomegaly or masses felt. Normal bowel sounds heard. Central nervous system: Alert and oriented.  Grossly intact, moving all extremities Extremities: Trace lower extremity edema Skin: No rashes over visible skin Psychiatry: Judgement and insight appear normal. Mood & affect appropriate.     Data  Reviewed: I have personally reviewed following labs and imaging studies  CBC: Recent Labs  Lab 04/19/18 0336 04/20/18 0325 04/22/18 0233 04/23/18 0225 04/24/18 0533  WBC 20.9* 19.3* 23.8* 21.3* 19.6*  NEUTROABS  --   --  21.2*  --   --   HGB 11.8* 11.8* 11.3* 11.1* 11.1*  HCT 35.6* 34.6* 33.3* 34.1* 33.8*  MCV 92.0 91.8  91.7 91.7 92.9  PLT 329 342 287 263 062   Basic Metabolic Panel: Recent Labs  Lab 04/20/18 0325 04/21/18 0228 04/22/18 0233 04/23/18 0225 04/24/18 0533  NA 134* 136 135 134* 134*  K 3.4* 3.5 3.3* 3.3* 3.3*  CL 103 103 101 99 98  CO2 $Re'22 24 25 25 23  'KLY$ GLUCOSE 79 83 95 98 84  BUN 72* 66* 62* 63* 60*  CREATININE 5.37* 5.21* 4.98* 5.15* 5.04*  CALCIUM 7.5* 7.5* 7.3* 7.4* 7.6*  MG  --   --  1.4* 2.1  --   PHOS 4.4 4.2 3.9 4.5 5.2*   GFR: Estimated Creatinine Clearance: 13.5 mL/min (A) (by C-G formula based on SCr of 5.04 mg/dL (H)). Liver Function Tests: Recent Labs  Lab 04/20/18 0325 04/21/18 0228 04/22/18 0233 04/23/18 0225 04/24/18 0533  AST  --   --   --  34  --   ALT  --   --   --  25  --   ALKPHOS  --   --   --  78  --   BILITOT  --   --   --  1.5*  --   PROT  --   --   --  7.3  --   ALBUMIN 1.5* 1.4* 1.4* 1.4* 1.7*   No results for input(s): LIPASE, AMYLASE in the last 168 hours. No results for input(s): AMMONIA in the last 168 hours. Coagulation Profile: Recent Labs  Lab 04/18/18 0405 04/19/18 0336 04/23/18 0225 04/24/18 0533  INR 1.92 2.60 3.15 1.64   Cardiac Enzymes: No results for input(s): CKTOTAL, CKMB, CKMBINDEX, TROPONINI in the last 168 hours. BNP (last 3 results) No results for input(s): PROBNP in the last 8760 hours. HbA1C: No results for input(s): HGBA1C in the last 72 hours. CBG: No results for input(s): GLUCAP in the last 168 hours. Lipid Profile: No results for input(s): CHOL, HDL, LDLCALC, TRIG, CHOLHDL, LDLDIRECT in the last 72 hours. Thyroid Function Tests: No results for input(s): TSH, T4TOTAL, FREET4, T3FREE, THYROIDAB in the last 72 hours. Anemia Panel: No results for input(s): VITAMINB12, FOLATE, FERRITIN, TIBC, IRON, RETICCTPCT in the last 72 hours. Sepsis Labs: No results for input(s): PROCALCITON, LATICACIDVEN in the last 168 hours.  Recent Results (from the past 240 hour(s))  Urine Culture     Status: Abnormal   Collection  Time: 04/14/18  3:48 PM  Result Value Ref Range Status   Specimen Description URINE, RANDOM  Final   Special Requests NONE  Final   Culture (A)  Final    <10,000 COLONIES/mL INSIGNIFICANT GROWTH Performed at [facility], [address] [place], [place] [address]    Report Status 04/15/2018 FINAL  Final  Culture, blood (x 2)     Status: None   Collection Time: 04/17/18  8:33 AM  Result Value Ref Range Status   Specimen Description BLOOD LEFT HAND  Final   Special Requests   Final    BOTTLES DRAWN AEROBIC ONLY Blood Culture adequate volume   Culture   Final    NO GROWTH 5 DAYS Performed at [facility]  [facility], [place] [address] [place], [place] [address]    Report Status 04/22/2018 FINAL  Final  Culture, blood (x 2)     Status: None   Collection Time: 04/17/18  8:46 AM  Result Value Ref Range Status   Specimen Description BLOOD LEFT HAND  Final   Special Requests   Final    BOTTLES DRAWN AEROBIC ONLY Blood Culture results may not be optimal due to an inadequate volume of blood received in culture bottles   Culture   Final    NO GROWTH 5 DAYS Performed at [facility], [place] [address] [place], [place] [address]    Report Status 04/22/2018 FINAL  Final  C difficile quick scan w PCR reflex     Status: Abnormal   Collection Time: 04/19/18  8:52 AM  Result Value Ref Range Status   C Diff antigen POSITIVE (A) NEGATIVE Final   C Diff toxin NEGATIVE NEGATIVE Final   C Diff interpretation Results are indeterminate. See PCR results.  Final  C. Diff by PCR, Reflexed     Status: None   Collection Time: 04/19/18  8:52 AM  Result Value Ref Range Status   Toxigenic C. Difficile by PCR NEGATIVE NEGATIVE Final    Comment: Patient is colonized with non toxigenic C. difficile. May not need treatment unless significant symptoms are present. Performed at [facility], [place] [address], [place], [place] [address]   Gastrointestinal Panel by PCR , Stool     Status: None    Collection Time: 04/23/18  3:17 AM  Result Value Ref Range Status   Campylobacter species NOT DETECTED NOT DETECTED Final   Plesimonas shigelloides NOT DETECTED NOT DETECTED Final   Salmonella species NOT DETECTED NOT DETECTED Final   Yersinia enterocolitica NOT DETECTED NOT DETECTED Final   Vibrio species NOT DETECTED NOT DETECTED Final   Vibrio cholerae NOT DETECTED NOT DETECTED Final   Enteroaggregative E coli (EAEC) NOT DETECTED NOT DETECTED Final   Enteropathogenic E coli (EPEC) NOT DETECTED NOT DETECTED Final   Enterotoxigenic E coli (ETEC) NOT DETECTED NOT DETECTED Final   Shiga like toxin producing E coli (STEC) NOT DETECTED NOT DETECTED Final   Shigella/Enteroinvasive E coli (EIEC) NOT DETECTED NOT DETECTED Final   Cryptosporidium NOT DETECTED NOT DETECTED Final   Cyclospora cayetanensis NOT DETECTED NOT DETECTED Final   Entamoeba histolytica NOT DETECTED NOT DETECTED Final   Giardia lamblia NOT DETECTED NOT DETECTED Final   Adenovirus F40/41 NOT DETECTED NOT DETECTED Final   Astrovirus NOT DETECTED NOT DETECTED Final   Norovirus GI/GII NOT DETECTED NOT DETECTED Final   Rotavirus A NOT DETECTED NOT DETECTED Final   Sapovirus (I, II, IV, and V) NOT DETECTED NOT DETECTED Final    Comment: Performed at [facility], [address], [place], [place] [address]         Radiology Studies: No results found.      Scheduled Meds: . diltiazem  360 mg Oral Daily  . famotidine  20 mg Oral Daily  . feeding supplement (ENSURE ENLIVE)  237 mL Oral BID BM  . feeding supplement (PRO-STAT SUGAR FREE 64)  30 mL Oral BID  . fentaNYL      . folic acid  1 mg Oral Daily  . levothyroxine  50 mcg Oral Q0600  . lidocaine (PF)      . metoprolol tartrate  75 mg Oral BID  . midazolam      . multivitamin with  minerals  1 tablet Oral Daily  . potassium chloride  40 mEq Oral Once  . saccharomyces boulardii  250 mg Oral BID  . sodium bicarbonate  650 mg Oral BID  . sodium chloride  flush  3 mL Intravenous Q12H  . thiamine  100 mg Oral Daily  . vancomycin  125 mg Oral QID   Continuous Infusions:    LOS: 10 days    Time spent: [place], MD [facility]  If 7PM-7AM, please contact night-coverage [email or web address] Password [email or web address] 04/24/2018, 10:30 AM

## 2018-04-24 NOTE — Progress Notes (Signed)
[facility] Progress Note   Subjective:      Creatinine stable this morning at 5.04  Sp renal biopsy this AM, uneventful  Urine output 0.4 L   MPO ANCA antibody is positive  GBM antibodies negative  C3 and C4 wnl  sFLC K:L ratio is consistent with low GFR state  ANCA, GBM Ab pending  ANA neg; SPEP w/o M Spike  UP/C 0.25  Results for DANYEL, GRIESS (MRN [id number]) as of 04/23/2018 11:40  Ref. Range 04/20/2018 03:25  ANCA Proteinase 3 Latest Ref Range: 0.0 - 3.5 U/mL 4.1 (H)  GBM Ab Latest Ref Range: 0 - 20 units 3  Myeloperoxidase Abs Latest Ref Range: 0.0 - 9.0 U/mL 62.1 (H)  Cytoplasmic (C-ANCA) Latest Ref Range: Neg:<1:20 titer <1:20  P-ANCA Latest Ref Range: Neg:<1:20 titer 1:640  Atypical P-ANCA titer Latest Ref Range: Neg:<1:20 titer <1:20    Objective Vitals:   04/23/18 2237 04/23/18 2335 04/24/18 0043 04/24/18 0421  BP: 107/76 98/71 111/78 (!) 97/54  Pulse:  (!) 110 (!) 129 91  Resp: (!) $RemoveB'23 19 20 16  'yURCDEbe$ Temp: 98.7 F (37.1 C) 98.4 F (36.9 C) 97.8 F (36.6 C) 97.7 F (36.5 C)  TempSrc: Oral Oral Oral Oral  SpO2:   96% 95%  Weight:   93.4 kg   Height:       Physical Exam General: elderly man lying in bed in no distress Eyes: conjunctiva injected BL, L eye blind ENT: MMM Heart: tachy, irreg Lungs: normal WOB Abdomen: soft, nontender Extremities: no edema Skin: no rashes  Additional Objective Labs: Basic Metabolic Panel: Recent Labs  Lab 04/22/18 0233 04/23/18 0225 04/24/18 0533  NA 135 134* 134*  K 3.3* 3.3* 3.3*  CL 101 99 98  CO2 $Re'25 25 23  'yRt$ GLUCOSE 95 98 84  BUN 62* 63* 60*  CREATININE 4.98* 5.15* 5.04*  CALCIUM 7.3* 7.4* 7.6*  PHOS 3.9 4.5 5.2*   Liver Function Tests: Recent Labs  Lab 04/22/18 0233 04/23/18 0225 04/24/18 0533  AST  --  34  --   ALT  --  25  --   ALKPHOS  --  78  --   BILITOT  --  1.5*  --   PROT  --  7.3  --   ALBUMIN 1.4* 1.4* 1.7*   No results for input(s): LIPASE, AMYLASE in the  last 168 hours. CBC: Recent Labs  Lab 04/19/18 0336 04/20/18 0325 04/22/18 0233 04/23/18 0225 04/24/18 0533  WBC 20.9* 19.3* 23.8* 21.3* 19.6*  NEUTROABS  --   --  21.2*  --   --   HGB 11.8* 11.8* 11.3* 11.1* 11.1*  HCT 35.6* 34.6* 33.3* 34.1* 33.8*  MCV 92.0 91.8 91.7 91.7 92.9  PLT 329 342 287 263 279   Blood Culture    Component Value Date/Time   SDES BLOOD LEFT HAND 04/17/2018 0846   SPECREQUEST  04/17/2018 0846    BOTTLES DRAWN AEROBIC ONLY Blood Culture results may not be optimal due to an inadequate volume of blood received in culture bottles   CULT  04/17/2018 0846    NO GROWTH 5 DAYS Performed at [place] [address], [place], [place] [address]    REPTSTATUS 04/22/2018 FINAL 04/17/2018 0846    Cardiac Enzymes: No results for input(s): CKTOTAL, CKMB, CKMBINDEX, TROPONINI in the last 168 hours. CBG: No results for input(s): GLUCAP in the last 168 hours. Iron Studies: No results for input(s): IRON, TIBC, TRANSFERRIN, FERRITIN in the last  72 hours. $RemoveB'@lablastinr3'lLHjFycQ$ @ Studies/Results: No results found. Medications:  . lidocaine (PF)      . diltiazem  360 mg Oral Daily  . famotidine  20 mg Oral Daily  . feeding supplement (ENSURE ENLIVE)  237 mL Oral BID BM  . feeding supplement (PRO-STAT SUGAR FREE 64)  30 mL Oral BID  . folic acid  1 mg Oral Daily  . levothyroxine  50 mcg Oral Q0600  . metoprolol tartrate  75 mg Oral BID  . multivitamin with minerals  1 tablet Oral Daily  . potassium chloride  40 mEq Oral Once  . saccharomyces boulardii  250 mg Oral BID  . sodium bicarbonate  650 mg Oral BID  . sodium chloride flush  3 mL Intravenous Q12H  . thiamine  100 mg Oral Daily  . vancomycin  125 mg Oral QID    2/11 CT:  Noting - Nonspecific mild perinephric fat stranding with renovascular calcifications bilaterally. No obstructive uropathy, renal mass or nephrolithiasis. Stable 14 mm cystic focus off the lower pole the left kidney with associated mild  renal cortical scarring.  Assessment/Plan: 1.  AKI, severe, nonoliguric:  Baseline normal 03/2017, we don't have interval labs.  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  Renal Bx 2/21.  Now start pulse Solu-Medrol.  Will also plan to start PO cyclophosphamide if biopsy suggests active disease..   2.  Leukocytosis: unclear etiology. Afebrile, no localizing s/s infection.  Might be related to #1.  Blood cultures are negative.  GI panel negative.  C. difficile not consistent with CDI.  3.  Transaminitis: following with hepatology (Dr. Laural Golden), liver biopsy aborted due to INR 9, though his transaminitis is now resolved.  Inflammatory markers were checked which were elevated and there is a suspicion for autoimmune disease.    4.  Atrial fibrillation:  Per primary.   5.  Metabolic acidosis:  Cont na bicarb 650  BID. Bicarb >20 today.   Pearson Grippe MD

## 2018-04-25 LAB — RENAL FUNCTION PANEL
ALBUMIN: 1.6 g/dL — AB (ref 3.5–5.0)
Anion gap: 10 (ref 5–15)
BUN: 64 mg/dL — ABNORMAL HIGH (ref 8–23)
CO2: 26 mmol/L (ref 22–32)
Calcium: 7.6 mg/dL — ABNORMAL LOW (ref 8.9–10.3)
Chloride: 99 mmol/L (ref 98–111)
Creatinine, Ser: 4.65 mg/dL — ABNORMAL HIGH (ref 0.61–1.24)
GFR calc Af Amer: 13 mL/min — ABNORMAL LOW (ref >60)
GFR calc non Af Amer: 11 mL/min — ABNORMAL LOW (ref >60)
Glucose, Bld: 173 mg/dL — ABNORMAL HIGH (ref 70–99)
PHOSPHORUS: 4.7 mg/dL — AB (ref 2.5–4.6)
Potassium: 3.7 mmol/L (ref 3.5–5.1)
Sodium: 135 mmol/L (ref 135–145)

## 2018-04-25 LAB — CBC
HCT: 33.9 % — ABNORMAL LOW (ref 39.0–52.0)
Hemoglobin: 11.3 g/dL — ABNORMAL LOW (ref 13.0–17.0)
MCH: 30.9 pg (ref 26.0–34.0)
MCHC: 33.3 g/dL (ref 30.0–36.0)
MCV: 92.6 fL (ref 80.0–100.0)
Platelets: 284 10*3/uL (ref 150–400)
RBC: 3.66 MIL/uL — ABNORMAL LOW (ref 4.22–5.81)
RDW: 14.3 % (ref 11.5–15.5)
WBC: 11.4 10*3/uL — ABNORMAL HIGH (ref 4.0–10.5)
nRBC: 0 % (ref 0.0–0.2)

## 2018-04-25 LAB — PROTIME-INR
INR: 1.46
Prothrombin Time: 17.6 seconds — ABNORMAL HIGH (ref 11.4–15.2)

## 2018-04-25 MED ORDER — WARFARIN SODIUM 5 MG PO TABS
5.0000 mg | ORAL_TABLET | Freq: Once | ORAL | Status: AC
  Administered 2018-04-25: 5 mg via ORAL
  Filled 2018-04-25: qty 1

## 2018-04-25 MED ORDER — WARFARIN - PHARMACIST DOSING INPATIENT
Freq: Every day | Status: DC
  Administered 2018-04-25: 19:00:00

## 2018-04-25 NOTE — Progress Notes (Signed)
[facility] Progress Note   Subjective:      SCr slightly improved this AM  Hb stable post biopsy, no report of gross hematuria  Began pulse solumedrol  Urine output 0.4 L   MPO ANCA antibody is positive  GBM antibodies negative  C3 and C4 wnl  sFLC K:L ratio is consistent with low GFR state  ANCA, GBM Ab pending  ANA neg; SPEP w/o M Spike  UP/C 0.25  Results for JUSTINN, WELTER (MRN [id number]) as of 04/23/2018 11:40  Ref. Range 04/20/2018 03:25  ANCA Proteinase 3 Latest Ref Range: 0.0 - 3.5 U/mL 4.1 (H)  GBM Ab Latest Ref Range: 0 - 20 units 3  Myeloperoxidase Abs Latest Ref Range: 0.0 - 9.0 U/mL 62.1 (H)  Cytoplasmic (C-ANCA) Latest Ref Range: Neg:<1:20 titer <1:20  P-ANCA Latest Ref Range: Neg:<1:20 titer 1:640  Atypical P-ANCA titer Latest Ref Range: Neg:<1:20 titer <1:20    Objective Vitals:   04/25/18 0018 04/25/18 0500 04/25/18 0529 04/25/18 0836  BP: 94/78  105/72 99/75  Pulse: (!) 56  66 92  Resp: 19   20  Temp: 97.7 F (36.5 C)  97.6 F (36.4 C) 98.2 F (36.8 C)  TempSrc: Oral  Oral Oral  SpO2: 95%  100% 96%  Weight: 92 kg 92 kg    Height:       Physical Exam General: elderly man lying in bed in no distress Eyes: conjunctiva injected BL, L eye blind ENT: MMM Heart: tachy, irreg Lungs: normal WOB Abdomen: soft, nontender Extremities: no edema Skin: no rashes  Additional Objective Labs: Basic Metabolic Panel: Recent Labs  Lab 04/23/18 0225 04/24/18 0533 04/25/18 0256  NA 134* 134* 135  K 3.3* 3.3* 3.7  CL 99 98 99  CO2 $Re'25 23 26  'KHG$ GLUCOSE 98 84 173*  BUN 63* 60* 64*  CREATININE 5.15* 5.04* 4.65*  CALCIUM 7.4* 7.6* 7.6*  PHOS 4.5 5.2* 4.7*   Liver Function Tests: Recent Labs  Lab 04/23/18 0225 04/24/18 0533 04/25/18 0256  AST 34  --   --   ALT 25  --   --   ALKPHOS 78  --   --   BILITOT 1.5*  --   --   PROT 7.3  --   --   ALBUMIN 1.4* 1.7* 1.6*   No results for input(s): LIPASE, AMYLASE in the last  168 hours. CBC: Recent Labs  Lab 04/20/18 0325 04/22/18 0233 04/23/18 0225 04/24/18 0533 04/25/18 0256  WBC 19.3* 23.8* 21.3* 19.6* 11.4*  NEUTROABS  --  21.2*  --   --   --   HGB 11.8* 11.3* 11.1* 11.1* 11.3*  HCT 34.6* 33.3* 34.1* 33.8* 33.9*  MCV 91.8 91.7 91.7 92.9 92.6  PLT 342 287 263 279 284   Blood Culture    Component Value Date/Time   SDES BLOOD LEFT HAND 04/17/2018 0846   SPECREQUEST  04/17/2018 0846    BOTTLES DRAWN AEROBIC ONLY Blood Culture results may not be optimal due to an inadequate volume of blood received in culture bottles   CULT  04/17/2018 0846    NO GROWTH 5 DAYS Performed at [facility] [address] [place], [place] [address]    REPTSTATUS 04/22/2018 FINAL 04/17/2018 0846    Cardiac Enzymes: No results for input(s): CKTOTAL, CKMB, CKMBINDEX, TROPONINI in the last 168 hours. CBG: No results for input(s): GLUCAP in the last 168 hours. Iron Studies: No results for input(s): IRON, TIBC, TRANSFERRIN, FERRITIN in the  last 72 hours. $RemoveB'@lablastinr3'yxEYamXS$ @ Studies/Results: US Biopsy (kidney)  Result Date: 04/24/2018 CLINICAL DATA:  Renal insufficiency. Random core renal biopsy requested. EXAM: ULTRASOUND GUIDED RENAL CORE BIOPSY COMPARISON:  CT 04/14/2018 TECHNIQUE: The procedure, risks (including but not limited to bleeding, infection, organ damage ), benefits, and alternatives were explained to the patient. Questions regarding the procedure were encouraged and answered. The patient understands and consents to the procedure. Survey ultrasound was performed and an appropriate skin entry site was localized. Site was marked, prepped with chlorhexidine, draped in usual sterile fashion, infiltrated locally with 1% lidocaine. Intravenous Fentanyl and Versed were administered as conscious sedation during continuous cardiorespiratory monitoring by the radiology RN with a total moderate sedation time of 10 minutes. Under real time ultrasound guidance, a 15 gauge  trocar needle was advanced to the margin of the lower pole of the left kidney for 2 coaxial 16 gauge biopsy needle passes. The core samples were submitted to pathology. Postprocedure images show no hematoma or other apparent complication. The patient tolerated procedure well. COMPLICATIONS: None immediate IMPRESSION: 1. Technically successful ultrasound-guided core renal biopsy , left lower pole. Electronically Signed   By: Lucrezia Europe M.D.   On: 04/24/2018 11:07   Medications: . methylPREDNISolone (SOLU-MEDROL) injection 500 mg (04/24/18 1511)   . diltiazem  360 mg Oral Daily  . famotidine  20 mg Oral Daily  . feeding supplement (ENSURE ENLIVE)  237 mL Oral BID BM  . feeding supplement (PRO-STAT SUGAR FREE 64)  30 mL Oral BID  . folic acid  1 mg Oral Daily  . levothyroxine  50 mcg Oral Q0600  . metoprolol tartrate  75 mg Oral BID  . multivitamin with minerals  1 tablet Oral Daily  . saccharomyces boulardii  250 mg Oral BID  . sodium bicarbonate  650 mg Oral BID  . sodium chloride flush  3 mL Intravenous Q12H  . thiamine  100 mg Oral Daily  . vancomycin  125 mg Oral QID    2/11 CT:  Noting - Nonspecific mild perinephric fat stranding with renovascular calcifications bilaterally. No obstructive uropathy, renal mass or nephrolithiasis. Stable 14 mm cystic focus off the lower pole the left kidney with associated mild renal cortical scarring.  Assessment/Plan: 1.  AKI, severe, nonoliguric:  Baseline normal 03/2017, we don't have interval labs.  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  Renal Bx 2/21 path report pending.  Started pulse Solu-Medrol 2/21 x3d then transition to $RemoveBefor'60mg'fBTXAcjFXXks$ /d.  Will also plan to start PO cyclophosphamide if biopsy suggests active disease.   2.  Leukocytosis: unclear etiology. Afebrile, no localizing s/s infection.  Might be related to #1.  Blood cultures are negative.  GI panel negative.  C. difficile not consistent with CDI  treating empirically with PO Vanc. Diarrhea resolved.  3.  Transaminitis: following with hepatology (Dr. Laural Golden), liver biopsy aborted due to INR 9, though his transaminitis is now resolved.  Inflammatory markers were checked which were elevated and there was a suspicion for autoimmune disease.    4.  Atrial fibrillation:  Per primary.  5.  Metabolic acidosis:  Cont na bicarb 650  BID. Bicarb >20 today.   Pearson Grippe MD

## 2018-04-25 NOTE — Progress Notes (Signed)
[place] for warfarin Indication: atrial fibrillation  Allergies  Allergen Reactions  . Statins Swelling    Legs hurt   Labs: Recent Labs    04/23/18 0225 04/24/18 0533 04/25/18 0256  HGB 11.1* 11.1* 11.3*  HCT 34.1* 33.8* 33.9*  PLT 263 279 284  LABPROT 31.9* 19.2* 17.6*  INR 3.15 1.64 1.46  CREATININE 5.15* 5.04* 4.65*    Estimated Creatinine Clearance: 14.6 mL/min (A) (by C-G formula based on SCr of 4.65 mg/dL (H)).   Assessment: [name] on warfarin PTA for atrial fibrillation. Was instructed by Dr. Laural Golden to hold warfarin on 2/5 with elevated LFTs, weakness, and poor appetite. INR on admission was elevated at 9.01 despite holding warfarin. Pt now s/p vitamin K 5 mg IV x 1 on 2/11, 10mg  vit k on 2/20 along with 2 units of FFP.   INR 1.46 today, pt s/p kidney biopsy, warfarin to resume tonight.  *PTA Dose 3.75mg  Tues/Thurs/Sat, 2.5mg  all other days  Goal of Therapy:  INR 2-3 Monitor platelets by anticoagulation protocol: Yes   Plan:  -Warfarin 5mg  PO x1 tonight -Daily INR  Arrie Senate, PharmD, BCPS Clinical Pharmacist [phone] Please check AMION for all [place] numbers 04/25/2018

## 2018-04-25 NOTE — Progress Notes (Signed)
PROGRESS NOTE    Daniel Morse  XBL:[id number] DOB: May 08, 1940 DOA: 04/14/2018 PCP: Sharilyn Sites, MD   Brief Narrative:  78 year old with past medical history relevant for hypothyroidism, hyperlipidemia, paroxysmal atrial fibrillation on warfarin, hypertension, history of alcohol abuse in remission who presented on 04/14/2018 with elevated INR and was admitted for AKI and significant diarrhea.  [facility] work-up showed that patient was positive for MPO antibodies.   Assessment & Plan:   Principal Problem:   Acute kidney failure (HCC) Active Problems:   Hyperlipidemia   Hypertension   GERD (gastroesophageal reflux disease)   Atrial fibrillation   Warfarin toxicity   Alcohol abuse, in remission   Hepatic steatosis   Malnutrition of moderate degree   #) AKI in the setting of MPO ANCA antibody positive: Creatinine slightly improved after initiating IV steroids -Status post kidney biopsy on 04/24/2018 -Started IV methylprednisolone 5 mg daily day 2 of 3 -Nephrology following - Hold nephrotoxins  #) Diarrhea: Resolved.  C. difficile antigen was positive but toxin was negative.  PCR was negative. - Continue oral vancomycin started 04/19/2018 -GI panel stool PCR was negative  #) Hypertension/paroxysmal atrial fibrillation:  -Warfarin per pharmacy consult, restart -Hold diltiazem and metoprolol  #) History of alcohol abuse: Per review of the chart patient is quite a heavy drinker until about 2 months ago.  Apparently the patient does not open to discussing his drinking and there is a concern for the wife safety.  Please do not discuss this in front of the patient.  #) Hypothyroidism: -Continue levothyroxine 50 mcg daily  Fluids: I tolerating p.o. Electrolytes: Monitor and supplement Nutrition: Heart healthy diet  Prophylaxis: Warfarin  Disposition: Improved creatinine and continue treatment of possible vasculitis  DNR  Consultants:   Nephrology  Palliative  care  Interventional radiology  Procedures:   Renal biopsy 04/24/2018  Antimicrobials:   P.o. vancomycin started 04/19/2018   Subjective: Patient reports that his mood and appetite are much improved.  He denies any hematuria, abdominal pain, flank pain, nausea, vomiting, diarrhea, cough, congestion, rhinorrhea.  Objective: Vitals:   04/25/18 0018 04/25/18 0500 04/25/18 0529 04/25/18 0836  BP: 94/78  105/72 99/75  Pulse: (!) 56  66 92  Resp: 19   20  Temp: 97.7 F (36.5 C)  97.6 F (36.4 C) 98.2 F (36.8 C)  TempSrc: Oral  Oral Oral  SpO2: 95%  100% 96%  Weight: 92 kg 92 kg    Height:        Intake/Output Summary (Last 24 hours) at 04/25/2018 1017 Last data filed at 04/24/2018 1731 Gross per 24 hour  Intake 271.81 ml  Output 400 ml  Net -128.19 ml   Filed Weights   04/24/18 0043 04/25/18 0018 04/25/18 0500  Weight: 93.4 kg 92 kg 92 kg    Examination:  General exam: Appears calm and comfortable  Respiratory system: Clear to auscultation. Respiratory effort normal. Cardiovascular system: Regular rate and rhythm, no murmurs Gastrointestinal system: Abdomen is nondistended, soft and nontender. No organomegaly or masses felt. Normal bowel sounds heard. Central nervous system: Alert and oriented.  Grossly intact, moving all extremities Extremities: Trace lower extremity edema Skin: Biopsy site is clean dry and intact Psychiatry: Judgement and insight appear normal. Mood & affect appropriate.     Data Reviewed: I have personally reviewed following labs and imaging studies  CBC: Recent Labs  Lab 04/20/18 0325 04/22/18 0233 04/23/18 0225 04/24/18 0533 04/25/18 0256  WBC 19.3* 23.8* 21.3* 19.6* 11.4*  NEUTROABS  --  21.2*  --   --   --   HGB 11.8* 11.3* 11.1* 11.1* 11.3*  HCT 34.6* 33.3* 34.1* 33.8* 33.9*  MCV 91.8 91.7 91.7 92.9 92.6  PLT 342 287 263 279 937   Basic Metabolic Panel: Recent Labs  Lab 04/21/18 0228 04/22/18 0233 04/23/18 0225  04/24/18 0533 04/25/18 0256  NA 136 135 134* 134* 135  K 3.5 3.3* 3.3* 3.3* 3.7  CL 103 101 99 98 99  CO2 $Re'24 25 25 23 26  'AJr$ GLUCOSE 83 95 98 84 173*  BUN 66* 62* 63* 60* 64*  CREATININE 5.21* 4.98* 5.15* 5.04* 4.65*  CALCIUM 7.5* 7.3* 7.4* 7.6* 7.6*  MG  --  1.4* 2.1  --   --   PHOS 4.2 3.9 4.5 5.2* 4.7*   GFR: Estimated Creatinine Clearance: 14.6 mL/min (A) (by C-G formula based on SCr of 4.65 mg/dL (H)). Liver Function Tests: Recent Labs  Lab 04/21/18 0228 04/22/18 0233 04/23/18 0225 04/24/18 0533 04/25/18 0256  AST  --   --  34  --   --   ALT  --   --  25  --   --   ALKPHOS  --   --  78  --   --   BILITOT  --   --  1.5*  --   --   PROT  --   --  7.3  --   --   ALBUMIN 1.4* 1.4* 1.4* 1.7* 1.6*   No results for input(s): LIPASE, AMYLASE in the last 168 hours. No results for input(s): AMMONIA in the last 168 hours. Coagulation Profile: Recent Labs  Lab 04/19/18 0336 04/23/18 0225 04/24/18 0533 04/25/18 0256  INR 2.60 3.15 1.64 1.46   Cardiac Enzymes: No results for input(s): CKTOTAL, CKMB, CKMBINDEX, TROPONINI in the last 168 hours. BNP (last 3 results) No results for input(s): PROBNP in the last 8760 hours. HbA1C: No results for input(s): HGBA1C in the last 72 hours. CBG: No results for input(s): GLUCAP in the last 168 hours. Lipid Profile: No results for input(s): CHOL, HDL, LDLCALC, TRIG, CHOLHDL, LDLDIRECT in the last 72 hours. Thyroid Function Tests: No results for input(s): TSH, T4TOTAL, FREET4, T3FREE, THYROIDAB in the last 72 hours. Anemia Panel: No results for input(s): VITAMINB12, FOLATE, FERRITIN, TIBC, IRON, RETICCTPCT in the last 72 hours. Sepsis Labs: No results for input(s): PROCALCITON, LATICACIDVEN in the last 168 hours.  Recent Results (from the past 240 hour(s))  Culture, blood (x 2)     Status: None   Collection Time: 04/17/18  8:33 AM  Result Value Ref Range Status   Specimen Description BLOOD LEFT HAND  Final   Special Requests    Final    BOTTLES DRAWN AEROBIC ONLY Blood Culture adequate volume   Culture   Final    NO GROWTH 5 DAYS Performed at [facility], [address], [place], [place] [address]    Report Status 04/22/2018 FINAL  Final  Culture, blood (x 2)     Status: None   Collection Time: 04/17/18  8:46 AM  Result Value Ref Range Status   Specimen Description BLOOD LEFT HAND  Final   Special Requests   Final    BOTTLES DRAWN AEROBIC ONLY Blood Culture results may not be optimal due to an inadequate volume of blood received in culture bottles   Culture   Final    NO GROWTH 5 DAYS Performed at [facility], [place] [address], [place], [place] [address]  Report Status 04/22/2018 FINAL  Final  C difficile quick scan w PCR reflex     Status: Abnormal   Collection Time: 04/19/18  8:52 AM  Result Value Ref Range Status   C Diff antigen POSITIVE (A) NEGATIVE Final   C Diff toxin NEGATIVE NEGATIVE Final   C Diff interpretation Results are indeterminate. See PCR results.  Final  C. Diff by PCR, Reflexed     Status: None   Collection Time: 04/19/18  8:52 AM  Result Value Ref Range Status   Toxigenic C. Difficile by PCR NEGATIVE NEGATIVE Final    Comment: Patient is colonized with non toxigenic C. difficile. May not need treatment unless significant symptoms are present. Performed at [facility], [place] [address], [place], [place] [address]   Gastrointestinal Panel by PCR , Stool     Status: None   Collection Time: 04/23/18  3:17 AM  Result Value Ref Range Status   Campylobacter species NOT DETECTED NOT DETECTED Final   Plesimonas shigelloides NOT DETECTED NOT DETECTED Final   Salmonella species NOT DETECTED NOT DETECTED Final   Yersinia enterocolitica NOT DETECTED NOT DETECTED Final   Vibrio species NOT DETECTED NOT DETECTED Final   Vibrio cholerae NOT DETECTED NOT DETECTED Final   Enteroaggregative E coli (EAEC) NOT DETECTED NOT DETECTED Final   Enteropathogenic E coli (EPEC) NOT  DETECTED NOT DETECTED Final   Enterotoxigenic E coli (ETEC) NOT DETECTED NOT DETECTED Final   Shiga like toxin producing E coli (STEC) NOT DETECTED NOT DETECTED Final   Shigella/Enteroinvasive E coli (EIEC) NOT DETECTED NOT DETECTED Final   Cryptosporidium NOT DETECTED NOT DETECTED Final   Cyclospora cayetanensis NOT DETECTED NOT DETECTED Final   Entamoeba histolytica NOT DETECTED NOT DETECTED Final   Giardia lamblia NOT DETECTED NOT DETECTED Final   Adenovirus F40/41 NOT DETECTED NOT DETECTED Final   Astrovirus NOT DETECTED NOT DETECTED Final   Norovirus GI/GII NOT DETECTED NOT DETECTED Final   Rotavirus A NOT DETECTED NOT DETECTED Final   Sapovirus (I, II, IV, and V) NOT DETECTED NOT DETECTED Final    Comment: Performed at [facility], [address], [place],  [address]         Radiology Studies: US Biopsy (kidney)  Result Date: 04/24/2018 CLINICAL DATA:  Renal insufficiency. Random core renal biopsy requested. EXAM: ULTRASOUND GUIDED RENAL CORE BIOPSY COMPARISON:  CT 04/14/2018 TECHNIQUE: The procedure, risks (including but not limited to bleeding, infection, organ damage ), benefits, and alternatives were explained to the patient. Questions regarding the procedure were encouraged and answered. The patient understands and consents to the procedure. Survey ultrasound was performed and an appropriate skin entry site was localized. Site was marked, prepped with chlorhexidine, draped in usual sterile fashion, infiltrated locally with 1% lidocaine. Intravenous Fentanyl and Versed were administered as conscious sedation during continuous cardiorespiratory monitoring by the radiology RN with a total moderate sedation time of 10 minutes. Under real time ultrasound guidance, a 15 gauge trocar needle was advanced to the margin of the lower pole of the left kidney for 2 coaxial 16 gauge biopsy needle passes. The core samples were submitted to pathology. Postprocedure images show no  hematoma or other apparent complication. The patient tolerated procedure well. COMPLICATIONS: None immediate IMPRESSION: 1. Technically successful ultrasound-guided core renal biopsy , left lower pole. Electronically Signed   By: Lucrezia Europe M.D.   On: 04/24/2018 11:07        Scheduled Meds: . diltiazem  360 mg Oral  Daily  . famotidine  20 mg Oral Daily  . feeding supplement (ENSURE ENLIVE)  237 mL Oral BID BM  . feeding supplement (PRO-STAT SUGAR FREE 64)  30 mL Oral BID  . folic acid  1 mg Oral Daily  . levothyroxine  50 mcg Oral Q0600  . metoprolol tartrate  75 mg Oral BID  . multivitamin with minerals  1 tablet Oral Daily  . saccharomyces boulardii  250 mg Oral BID  . sodium bicarbonate  650 mg Oral BID  . sodium chloride flush  3 mL Intravenous Q12H  . thiamine  100 mg Oral Daily  . vancomycin  125 mg Oral QID   Continuous Infusions: . methylPREDNISolone (SOLU-MEDROL) injection 500 mg (04/25/18 0953)     LOS: 11 days    Time spent: [place], MD [facility]  If 7PM-7AM, please contact night-coverage [email or web address] Password [facility] 04/25/2018, 10:17 AM

## 2018-04-26 LAB — RENAL FUNCTION PANEL
ALBUMIN: 1.7 g/dL — AB (ref 3.5–5.0)
Anion gap: 13 (ref 5–15)
BUN: 71 mg/dL — ABNORMAL HIGH (ref 8–23)
CO2: 25 mmol/L (ref 22–32)
Calcium: 7.8 mg/dL — ABNORMAL LOW (ref 8.9–10.3)
Chloride: 97 mmol/L — ABNORMAL LOW (ref 98–111)
Creatinine, Ser: 3.68 mg/dL — ABNORMAL HIGH (ref 0.61–1.24)
GFR calc Af Amer: 18 mL/min — ABNORMAL LOW (ref >60)
GFR calc non Af Amer: 15 mL/min — ABNORMAL LOW (ref >60)
Glucose, Bld: 154 mg/dL — ABNORMAL HIGH (ref 70–99)
POTASSIUM: 3.2 mmol/L — AB (ref 3.5–5.1)
Phosphorus: 4.3 mg/dL (ref 2.5–4.6)
Sodium: 135 mmol/L (ref 135–145)

## 2018-04-26 LAB — PROTIME-INR
INR: 1.41
Prothrombin Time: 17.1 seconds — ABNORMAL HIGH (ref 11.4–15.2)

## 2018-04-26 MED ORDER — DILTIAZEM HCL ER COATED BEADS 360 MG PO CP24: 360.0000 mg | ORAL_CAPSULE | Freq: Every day | ORAL | 0 refills | Status: DC

## 2018-04-26 MED ORDER — SODIUM BICARBONATE 650 MG PO TABS: 650.0000 mg | ORAL_TABLET | Freq: Two times a day (BID) | ORAL | 0 refills | Status: DC

## 2018-04-26 MED ORDER — POTASSIUM CHLORIDE CRYS ER 20 MEQ PO TBCR
40.0000 meq | EXTENDED_RELEASE_TABLET | Freq: Once | ORAL | Status: AC
  Administered 2018-04-26: 40 meq via ORAL
  Filled 2018-04-26: qty 2

## 2018-04-26 MED ORDER — PREDNISONE 10 MG PO TABS: 60.0000 mg | ORAL_TABLET | Freq: Every day | ORAL | 0 refills | Status: DC

## 2018-04-26 MED ORDER — METOPROLOL TARTRATE 75 MG PO TABS: 75.0000 mg | ORAL_TABLET | Freq: Two times a day (BID) | ORAL | 0 refills | Status: DC

## 2018-04-26 NOTE — Progress Notes (Signed)
[name] Progress Note   Subjective:      SCr significantly improved this AM  Day # 3 of pulse solumedrol  UOP not captured   MPO ANCA antibody is positive  GBM antibodies negative  C3 and C4 wnl  sFLC K:L ratio is consistent with low GFR state  ANCA, GBM Ab pending  ANA neg; SPEP w/o M Spike  UP/C 0.25  Results for Daniel Morse, Daniel Morse (MRN [id number]) as of 04/23/2018 11:40  Ref. Range 04/20/2018 03:25  ANCA Proteinase 3 Latest Ref Range: 0.0 - 3.5 U/mL 4.1 (H)  GBM Ab Latest Ref Range: 0 - 20 units 3  Myeloperoxidase Abs Latest Ref Range: 0.0 - 9.0 U/mL 62.1 (H)  Cytoplasmic (C-ANCA) Latest Ref Range: Neg:<1:20 titer <1:20  P-ANCA Latest Ref Range: Neg:<1:20 titer 1:640  Atypical P-ANCA titer Latest Ref Range: Neg:<1:20 titer <1:20    Objective Vitals:   04/26/18 0027 04/26/18 0101 04/26/18 0428 04/26/18 0819  BP: 125/79  109/75 116/75  Pulse: (!) 56  92   Resp: (!) 36 $Remo'20 19 19  'MnKvt$ Temp: (!) 97.5 F (36.4 C)  (!) 97.5 F (36.4 C) (!) 97.5 F (36.4 C)  TempSrc: Oral  Oral Oral  SpO2: 97%  94% 94%  Weight:   90.8 kg   Height:       Physical Exam General: elderly man lying in bed in no distress Eyes: conjunctiva injected BL, L eye blind ENT: MMM Heart: tachy, irreg Lungs: normal WOB Abdomen: soft, nontender Extremities: no edema Skin: no rashes  Additional Objective Labs: Basic Metabolic Panel: Recent Labs  Lab 04/24/18 0533 04/25/18 0256 04/26/18 0317  NA 134* 135 135  K 3.3* 3.7 3.2*  CL 98 99 97*  CO2 $Re'23 26 25  'Dqp$ GLUCOSE 84 173* 154*  BUN 60* 64* 71*  CREATININE 5.04* 4.65* 3.68*  CALCIUM 7.6* 7.6* 7.8*  PHOS 5.2* 4.7* 4.3   Liver Function Tests: Recent Labs  Lab 04/23/18 0225 04/24/18 0533 04/25/18 0256 04/26/18 0317  AST 34  --   --   --   ALT 25  --   --   --   ALKPHOS 78  --   --   --   BILITOT 1.5*  --   --   --   PROT 7.3  --   --   --   ALBUMIN 1.4* 1.7* 1.6* 1.7*   No results for input(s): LIPASE,  AMYLASE in the last 168 hours. CBC: Recent Labs  Lab 04/20/18 0325 04/22/18 0233 04/23/18 0225 04/24/18 0533 04/25/18 0256  WBC 19.3* 23.8* 21.3* 19.6* 11.4*  NEUTROABS  --  21.2*  --   --   --   HGB 11.8* 11.3* 11.1* 11.1* 11.3*  HCT 34.6* 33.3* 34.1* 33.8* 33.9*  MCV 91.8 91.7 91.7 92.9 92.6  PLT 342 287 263 279 284   Blood Culture    Component Value Date/Time   SDES BLOOD LEFT HAND 04/17/2018 0846   SPECREQUEST  04/17/2018 0846    BOTTLES DRAWN AEROBIC ONLY Blood Culture results may not be optimal due to an inadequate volume of blood received in culture bottles   CULT  04/17/2018 0846    NO GROWTH 5 DAYS Performed at [facility] [address], [place], [place] [address]    REPTSTATUS 04/22/2018 FINAL 04/17/2018 0846    Cardiac Enzymes: No results for input(s): CKTOTAL, CKMB, CKMBINDEX, TROPONINI in the last 168 hours. CBG: No results for input(s): GLUCAP in the last 168  hours. Iron Studies: No results for input(s): IRON, TIBC, TRANSFERRIN, FERRITIN in the last 72 hours. $RemoveB'@lablastinr3'DliXfaef$ @ Studies/Results: US Biopsy (kidney)  Result Date: 04/24/2018 CLINICAL DATA:  Renal insufficiency. Random core renal biopsy requested. EXAM: ULTRASOUND GUIDED RENAL CORE BIOPSY COMPARISON:  CT 04/14/2018 TECHNIQUE: The procedure, risks (including but not limited to bleeding, infection, organ damage ), benefits, and alternatives were explained to the patient. Questions regarding the procedure were encouraged and answered. The patient understands and consents to the procedure. Survey ultrasound was performed and an appropriate skin entry site was localized. Site was marked, prepped with chlorhexidine, draped in usual sterile fashion, infiltrated locally with 1% lidocaine. Intravenous Fentanyl and Versed were administered as conscious sedation during continuous cardiorespiratory monitoring by the radiology RN with a total moderate sedation time of 10 minutes. Under real time ultrasound  guidance, a 15 gauge trocar needle was advanced to the margin of the lower pole of the left kidney for 2 coaxial 16 gauge biopsy needle passes. The core samples were submitted to pathology. Postprocedure images show no hematoma or other apparent complication. The patient tolerated procedure well. COMPLICATIONS: None immediate IMPRESSION: 1. Technically successful ultrasound-guided core renal biopsy , left lower pole. Electronically Signed   By: Lucrezia Europe M.D.   On: 04/24/2018 11:07   Medications: . methylPREDNISolone (SOLU-MEDROL) injection 500 mg (04/26/18 1013)   . diltiazem  360 mg Oral Daily  . famotidine  20 mg Oral Daily  . feeding supplement (ENSURE ENLIVE)  237 mL Oral BID BM  . feeding supplement (PRO-STAT SUGAR FREE 64)  30 mL Oral BID  . folic acid  1 mg Oral Daily  . levothyroxine  50 mcg Oral Q0600  . metoprolol tartrate  75 mg Oral BID  . multivitamin with minerals  1 tablet Oral Daily  . saccharomyces boulardii  250 mg Oral BID  . sodium bicarbonate  650 mg Oral BID  . sodium chloride flush  3 mL Intravenous Q12H  . thiamine  100 mg Oral Daily  . vancomycin  125 mg Oral QID  . Warfarin - Pharmacist Dosing Inpatient   Does not apply q1800    2/11 CT:  Noting - Nonspecific mild perinephric fat stranding with renovascular calcifications bilaterally. No obstructive uropathy, renal mass or nephrolithiasis. Stable 14 mm cystic focus off the lower pole the left kidney with associated mild renal cortical scarring.  Assessment/Plan: 1.  AKI, severe, nonoliguric:  Baseline normal 03/2017, we don't have interval labs.  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  Renal Bx 2/21 path report pending.  Started pulse Solu-Medrol 2/21 x3d then transition to $RemoveBefor'60mg'CnuWyvPrIQHW$ /d without taper.  Will also plan to start PO cyclophosphamide if biopsy suggests active disease.   OK for DC.  I will make close f/u appt with me at [facility].  Cytoxan decided as outpt  2.   Leukocytosis: ANCA Related? Improving. Afebrile, no localizing s/s infection.  Might be related to #1.  Blood cultures are negative.  GI panel negative.  C. difficile not consistent with CDI treating empirically with PO Vanc. Diarrhea resolved.  3.  Transaminitis, resolved: following with hepatology (Dr. Laural Golden), liver biopsy aborted due to INR 9, though his transaminitis is now resolved.  Inflammatory markers were checked which were elevated and there was a suspicion for autoimmune disease.    4.  Atrial fibrillation:  Per primary.  5.  Metabolic acidosis:  Cont na bicarb 650  BID. Bicarb >20 today.   Pearson Grippe MD

## 2018-04-26 NOTE — Discharge Summary (Signed)
Physician Discharge Summary  Daniel Morse KZS:[id number] DOB: [birth year] DOA: 04/14/2018  PCP: Sharilyn Sites, MD  Admit date: 04/14/2018 Discharge date: 04/26/2018  Admitted From: [facility] Disposition:  Home  Recommendations for Outpatient Follow-up:  1. Follow up with PCP in 1-2 weeks 2. Please obtain RFP/CBC in one week 3. Please take your steroids as prescribed. 4. Please get your Coumadin levels checked next week  Home Health: no Equipment/Devices:none  Discharge Condition: stable CODE STATUS: DNR Diet recommendation: Heart Healthy    Brief/Interim Summary:  #) AKI possibly due to n.p.o. antibodies: Patient was admitted for abnormal labs (see below) and was found to have significant AKI.  Initially nephrology was consulted and patient was given IV fluids.  An RPGN work-up was initiated.  While this was pending the patient was insistent that he did not want dialysis.  Due to significant persistence of AKI palliative care was consulted.  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  Patient received kidney biopsy on 04/24/2018 after reversal of INR.  Patient was started on 3 doses of high-dose IV methylprednisolone at 500 mg.  He completed this and was discharged on 60 mg daily of prednisone.  He was not oliguric.  He continued to have improvement in creatinine on the steroids.  Patient will follow-up as an outpatient for the kidney biopsy with consideration of starting possible oral cyclophosphamide depending on the results.  Patient additionally was discharged on sodium bicarbonate tablets for metabolic acidosis.  #) Elevated INR: Patient was sent to the ER because he was initially pending a liver biopsy but was found to have an INR of 9.  This was reversed and his warfarin was held.  His warfarin was restarted with the anticipation of discharge until his n.p.o. antibodies came back positive and he again required a dose of vitamin K as well as FFP to reverse his INR.  He was discharged back on  warfarin.  He has follow-up with the warfarin clinic.  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  #) Chronic atrial fibrillation/hypertension/hyperlipidemia: Patient's warfarin was held and restarted on discharge.  Initially diltiazem and metoprolol were held.  These doses were uptitrated due to patient's hypertension.  He was discharged on higher doses of daily diltiazem and metoprolol.  #) History of alcohol abuse: Patient apparently was quite a heavy alcohol user until about 2 months prior to admission.  Patient apparently has not been willing to discuss this but his wife would take the doctor aside.  Please do not discuss his alcohol abuse in front of his wife.  #) Thyroidism: Patient was continued on home levothyroxine.   Discharge Diagnoses:  Principal Problem:   Acute kidney failure (HCC) Active Problems:   Hyperlipidemia   Hypertension   GERD (gastroesophageal reflux disease)   Atrial fibrillation   Warfarin toxicity   Alcohol abuse, in remission   Hepatic steatosis   Malnutrition of moderate degree    Discharge Instructions  Discharge Instructions    Call MD for:  difficulty breathing, headache or visual disturbances   Complete by:  As directed    Call MD for:  extreme fatigue   Complete by:  As directed    Call MD for:  hives   Complete by:  As directed    Call MD for:  persistant dizziness or light-headedness   Complete by:  As directed    Call MD  for:  persistant nausea and vomiting   Complete by:  As directed    Call MD for:  redness, tenderness, or signs of infection (pain, swelling, redness, odor or green/yellow discharge around incision site)   Complete by:  As directed    Call MD for:  severe uncontrolled pain   Complete by:  As directed    Call MD for:  temperature >100.4   Complete by:  As directed    Diet - low sodium heart  healthy   Complete by:  As directed    Discharge instructions   Complete by:  As directed    Please take your steroids as prescribed.  Please follow-up with your primary care doctor in 1 week.  You will be contacted by the kidney doctors for your follow-up appointment.   Increase activity slowly   Complete by:  As directed      Allergies as of 04/26/2018      Reactions   Statins Swelling   Legs hurt      Medication List    STOP taking these medications   diltiazem 60 MG tablet Commonly known as:  CARDIZEM     TAKE these medications   diltiazem 360 MG 24 hr capsule Commonly known as:  CARDIZEM CD Take 1 capsule (360 mg total) by mouth daily. Start taking on:  April 27, 2018   ICAPS LUTEIN-ZEAXANTHIN PO Take 1 tablet by mouth 2 (two) times daily.   levothyroxine 50 MCG tablet Commonly known as:  SYNTHROID, LEVOTHROID Take 50 mcg by mouth every morning.   Metoprolol Tartrate 75 MG Tabs Take 75 mg by mouth 2 (two) times daily for 30 days. What changed:    medication strength  how much to take   pantoprazole 40 MG tablet Commonly known as:  PROTONIX TAKE 1 TABLET BY MOUTH ONCE DAILY What changed:  when to take this   predniSONE 10 MG tablet Commonly known as:  DELTASONE Take 6 tablets (60 mg total) by mouth daily for 30 days.   sodium bicarbonate 650 MG tablet Take 1 tablet (650 mg total) by mouth 2 (two) times daily for 30 days.   warfarin 2.5 MG tablet Commonly known as:  COUMADIN Take as directed. If you are unsure how to take this medication, talk to your nurse or doctor. Original instructions:  Take 1 tablet daily except 1 1/2 tablets on Tuesdays, Thursdays and Saturdays What changed:    how much to take  how to take this  when to take this       Allergies  Allergen Reactions  . Statins Swelling    Legs hurt    Consultations: Nephrology Palliative care Interventional radiology  Procedures/Studies: Dg Chest 2 View  Result Date:  04/06/2018 CLINICAL DATA:  Dry cough 6 weeks. EXAM: CHEST - 2 VIEW COMPARISON:  Chest x-ray FINDINGS: Bilateral densities are stable, presumably pleural. Nodular densities are present upper lobes stable. Superimposed airspace disease is present. Heart is mildly enlarged.  There is no edema or effusion. IMPRESSION: No active cardiopulmonary disease. Electronically Signed   By: San Morelle M.D.   On: 04/06/2018 12:54   Ct Head Wo Contrast  Result Date: 04/14/2018 CLINICAL DATA:  Elevated INR with altered level of consciousness and confusion. EXAM: CT HEAD WITHOUT CONTRAST TECHNIQUE: Contiguous axial images were obtained from the base of the skull through the vertex without intravenous contrast. COMPARISON:  Head CT 05/10/2012 FINDINGS: Brain: Stable involutional changes of brain with chronic microvascular ischemic disease of  white matter. No acute large vascular territory infarction, hemorrhage or mass. No mass effect. No hydrocephalus. Midline fourth ventricle and basal cisterns. Brainstem and cerebellum are nonacute. Vascular: Atherosclerosis of the carotid siphons. Skull: Intact without suspicious osseous lesions or fracture. Sinuses/Orbits: Intact Other: None IMPRESSION: No acute intracranial abnormality. 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 Electronically Signed   By: Ashley Royalty M.D.   On: 04/14/2018 16:48   US Abdomen Complete  Result Date: 04/03/2018 CLINICAL DATA:  Elevated liver enzymes. EXAM: ABDOMEN ULTRASOUND COMPLETE COMPARISON:  CT abdomen and pelvis 02/23/2014 FINDINGS: Gallbladder: No gallstones or wall thickening visualized. No sonographic Murphy sign noted by sonographer. Common bile duct: Diameter: 4 mm Liver: Mildly increased parenchymal echogenicity diffusely without focal abnormality identified. Portal vein is patent on color Doppler imaging with normal direction of blood flow towards the liver. IVC: No abnormality visualized. Pancreas: Visualized portion  unremarkable. Spleen: Size and appearance within normal limits. Right Kidney: Length: 11.4 cm. Echogenicity within normal limits. No mass or hydronephrosis visualized. Left Kidney: Length: 11.3 cm. Echogenicity within normal limits. No hydronephrosis. 2.1 cm interpolar cystr. Abdominal aorta: 2.9 cm diameter proximally without focal aneurysm an with normal diameter more distally. Other findings: None. IMPRESSION: 1. Mildly echogenic liver likely reflecting steatosis. 2. No gallstones or biliary dilatation. 3. 2.1 cm left renal cyst. Electronically Signed   By: Logan Bores M.D.   On: 04/03/2018 14:22   Dg Chest Port 1 View  Result Date: 04/17/2018 CLINICAL DATA:  Leukocytosis.  Hypertension. EXAM: PORTABLE CHEST 1 VIEW COMPARISON:  April 06, 2018 FINDINGS: There is scarring along the left base. There are apparent calcified pleural plaques bilaterally. Calcification is also seen along the left pleural surface. There is no edema or consolidation. Heart is upper normal in size with pulmonary vascularity normal. No adenopathy. There is aortic atherosclerosis. No bone lesions. IMPRESSION: Apparent pleural plaques and left pleural calcification. These are changes likely indicative of previous asbestos exposure. Mild scarring left base. No edema or consolidation evident. Stable cardiac silhouette. Aortic Atherosclerosis (ICD10-I70.0). Electronically Signed   By: Lowella Grip III M.D.   On: 04/17/2018 09:33   Ct Renal Stone Study  Result Date: 04/14/2018 CLINICAL DATA:  Acute renal failure EXAM: CT ABDOMEN AND PELVIS WITHOUT CONTRAST TECHNIQUE: Multidetector CT imaging of the abdomen and pelvis was performed following the standard protocol without IV contrast. COMPARISON:  02/23/2014 FINDINGS: Lower chest: Calcified pleural plaque is identified at each lung base consistent prior asbestos exposure. Coronary arteriosclerosis of the included heart is noted without pericardial effusion or thickening.  Hepatobiliary: Calcified granuloma in the right hepatic lobe. The unenhanced liver is unremarkable given limitations of a noncontrast study. No biliary dilatation is noted. The gallbladder is normal in appearance. Pancreas: Normal Spleen: Normal Adrenals/Urinary Tract: Normal bilateral adrenal glands. Nonspecific mild perinephric fat stranding with renovascular calcifications bilaterally. No obstructive uropathy, renal mass or nephrolithiasis. Stable 14 mm cystic focus off the lower pole the left kidney with associated mild renal cortical scarring. Stomach/Bowel: Stomach is within normal limits. Small hiatal hernia. Appendix appears normal. No evidence of bowel wall thickening, distention, or inflammatory changes. Scattered colonic diverticulosis without acute diverticulitis is identified. Vascular/Lymphatic: Aortoiliac atherosclerosis. No adenopathy by CT size criteria. Reproductive: Unremarkable prostate and seminal vesicles. Other: No free air or free fluid. Musculoskeletal: No acute or significant osseous findings. IMPRESSION: 1. Calcified pleural plaque at each lung base consistent with prior asbestos exposure. 2. Stable 14 mm cystic focus off the lower pole the left kidney with  associated renal cortical scarring. No nephrolithiasis nor obstructive uropathy. No CT findings to explain the patient's renal failure. 3. Colonic diverticulosis without acute diverticulitis. Aortic Atherosclerosis (ICD10-I70.0). Electronically Signed   By: Ashley Royalty M.D.   On: 04/14/2018 16:59   US Biopsy (kidney)  Result Date: 04/24/2018 CLINICAL DATA:  Renal insufficiency. Random core renal biopsy requested. EXAM: ULTRASOUND GUIDED RENAL CORE BIOPSY COMPARISON:  CT 04/14/2018 TECHNIQUE: The procedure, risks (including but not limited to bleeding, infection, organ damage ), benefits, and alternatives were explained to the patient. Questions regarding the procedure were encouraged and answered. The patient understands and  consents to the procedure. Survey ultrasound was performed and an appropriate skin entry site was localized. Site was marked, prepped with chlorhexidine, draped in usual sterile fashion, infiltrated locally with 1% lidocaine. Intravenous Fentanyl and Versed were administered as conscious sedation during continuous cardiorespiratory monitoring by the radiology RN with a total moderate sedation time of 10 minutes. Under real time ultrasound guidance, a 15 gauge trocar needle was advanced to the margin of the lower pole of the left kidney for 2 coaxial 16 gauge biopsy needle passes. The core samples were submitted to pathology. Postprocedure images show no hematoma or other apparent complication. The patient tolerated procedure well. COMPLICATIONS: None immediate IMPRESSION: 1. Technically successful ultrasound-guided core renal biopsy , left lower pole. Electronically Signed   By: Lucrezia Europe M.D.   On: 04/24/2018 11:07   US Scrotum W/doppler  Result Date: 04/06/2018 CLINICAL DATA:  Left testicular swelling. EXAM: SCROTAL ULTRASOUND DOPPLER ULTRASOUND OF THE TESTICLES TECHNIQUE: Complete ultrasound examination of the testicles, epididymis, and other scrotal structures was performed. Color and spectral Doppler ultrasound were also utilized to evaluate blood flow to the testicles. COMPARISON:  None. FINDINGS: Right testicle Measurements: 3.6 x 2.2 x 1.4 cm. No mass or microlithiasis visualized. 3 mm cyst is noted. Left testicle Measurements: 4.0 x 2.1 x 1.8 cm 4 mm septated cyst is noted. No mass or microlithiasis visualized. Right epididymis:  Normal in size and appearance. Left epididymis: Increased vascularity of left epididymis is noted suggesting epididymitis. Hydrocele:  Large multi-septated complex left hydrocele is noted. Varicocele:  None visualized. Pulsed Doppler interrogation of both testes demonstrates normal low resistance arterial and venous waveforms bilaterally. IMPRESSION: No definite evidence of  testicular torsion. Small complex cysts are noted in both testicles. Left epididymis is enlarged and hypervascular suggesting epididymitis. Large multi-septated complex left hydrocele is noted which may be inflammatory in etiology. Electronically Signed   By: Marijo Conception, M.D.   On: 04/06/2018 13:31    04/24/2018 renal biopsy pathology pending   Subjective:   Discharge Exam: Vitals:   04/26/18 0428 04/26/18 0819  BP: 109/75 116/75  Pulse: 92   Resp: 19 19  Temp: (!) 97.5 F (36.4 C) (!) 97.5 F (36.4 C)  SpO2: 94% 94%   Vitals:   04/26/18 0027 04/26/18 0101 04/26/18 0428 04/26/18 0819  BP: 125/79  109/75 116/75  Pulse: (!) 56  92   Resp: (!) 36 $Remo'20 19 19  'yngiM$ Temp: (!) 97.5 F (36.4 C)  (!) 97.5 F (36.4 C) (!) 97.5 F (36.4 C)  TempSrc: Oral  Oral Oral  SpO2: 97%  94% 94%  Weight:   90.8 kg   Height:       General exam: Appears calm and comfortable  Respiratory system: Clear to auscultation. Respiratory effort normal. Cardiovascular system: Regular rate and rhythm, no murmurs Gastrointestinal system: Abdomen is nondistended, soft and nontender. No organomegaly or  masses felt. Normal bowel sounds heard. Central nervous system: Alert and oriented.  Grossly intact, moving all extremities Extremities: Trace lower extremity edema Skin: Biopsy site is clean dry and intact Psychiatry: Judgement and insight appear normal. Mood & affect appropriate.      The results of significant diagnostics from this hospitalization (including imaging, microbiology, ancillary and laboratory) are listed below for reference.     Microbiology: Recent Results (from the past 240 hour(s))  Culture, blood (x 2)     Status: None   Collection Time: 04/17/18  8:33 AM  Result Value Ref Range Status   Specimen Description BLOOD LEFT HAND  Final   Special Requests   Final    BOTTLES DRAWN AEROBIC ONLY Blood Culture adequate volume   Culture   Final    NO GROWTH 5 DAYS Performed at [facility], [address], [place], [place] [address]    Report Status 04/22/2018 FINAL  Final  Culture, blood (x 2)     Status: None   Collection Time: 04/17/18  8:46 AM  Result Value Ref Range Status   Specimen Description BLOOD LEFT HAND  Final   Special Requests   Final    BOTTLES DRAWN AEROBIC ONLY Blood Culture results may not be optimal due to an inadequate volume of blood received in culture bottles   Culture   Final    NO GROWTH 5 DAYS Performed at [facility], [place] [address], [place], [place] [address]    Report Status 04/22/2018 FINAL  Final  C difficile quick scan w PCR reflex     Status: Abnormal   Collection Time: 04/19/18  8:52 AM  Result Value Ref Range Status   C Diff antigen POSITIVE (A) NEGATIVE Final   C Diff toxin NEGATIVE NEGATIVE Final   C Diff interpretation Results are indeterminate. See PCR results.  Final  C. Diff by PCR, Reflexed     Status: None   Collection Time: 04/19/18  8:52 AM  Result Value Ref Range Status   Toxigenic C. Difficile by PCR NEGATIVE NEGATIVE Final    Comment: Patient is colonized with non toxigenic C. difficile. May not need treatment unless significant symptoms are present. Performed at [facility], [place] [address], [place], [place] [address]   Gastrointestinal Panel by PCR , Stool     Status: None   Collection Time: 04/23/18  3:17 AM  Result Value Ref Range Status   Campylobacter species NOT DETECTED NOT DETECTED Final   Plesimonas shigelloides NOT DETECTED NOT DETECTED Final   Salmonella species NOT DETECTED NOT DETECTED Final   Yersinia enterocolitica NOT DETECTED NOT DETECTED Final   Vibrio species NOT DETECTED NOT DETECTED Final   Vibrio cholerae NOT DETECTED NOT DETECTED Final   Enteroaggregative E coli (EAEC) NOT DETECTED NOT DETECTED Final   Enteropathogenic E coli (EPEC) NOT DETECTED NOT DETECTED Final   Enterotoxigenic E coli (ETEC) NOT DETECTED NOT DETECTED Final   Shiga like toxin producing E coli (STEC)  NOT DETECTED NOT DETECTED Final   Shigella/Enteroinvasive E coli (EIEC) NOT DETECTED NOT DETECTED Final   Cryptosporidium NOT DETECTED NOT DETECTED Final   Cyclospora cayetanensis NOT DETECTED NOT DETECTED Final   Entamoeba histolytica NOT DETECTED NOT DETECTED Final   Giardia lamblia NOT DETECTED NOT DETECTED Final   Adenovirus F40/41 NOT DETECTED NOT DETECTED Final   Astrovirus NOT DETECTED NOT DETECTED Final   Norovirus GI/GII NOT DETECTED NOT DETECTED Final   Rotavirus A  NOT DETECTED NOT DETECTED Final   Sapovirus (I, II, IV, and V) NOT DETECTED NOT DETECTED Final    Comment: Performed at [facility], [address] [place], [place] [address]     Labs: BNP (last 3 results) No results for input(s): BNP in the last 8760 hours. Basic Metabolic Panel: Recent Labs  Lab 04/22/18 0233 04/23/18 0225 04/24/18 0533 04/25/18 0256 04/26/18 0317  NA 135 134* 134* 135 135  K 3.3* 3.3* 3.3* 3.7 3.2*  CL 101 99 98 99 97*  CO2 $Re'25 25 23 26 25  'VRx$ GLUCOSE 95 98 84 173* 154*  BUN 62* 63* 60* 64* 71*  CREATININE 4.98* 5.15* 5.04* 4.65* 3.68*  CALCIUM 7.3* 7.4* 7.6* 7.6* 7.8*  MG 1.4* 2.1  --   --   --   PHOS 3.9 4.5 5.2* 4.7* 4.3   Liver Function Tests: Recent Labs  Lab 04/22/18 0233 04/23/18 0225 04/24/18 0533 04/25/18 0256 04/26/18 0317  AST  --  34  --   --   --   ALT  --  25  --   --   --   ALKPHOS  --  78  --   --   --   BILITOT  --  1.5*  --   --   --   PROT  --  7.3  --   --   --   ALBUMIN 1.4* 1.4* 1.7* 1.6* 1.7*   No results for input(s): LIPASE, AMYLASE in the last 168 hours. No results for input(s): AMMONIA in the last 168 hours. CBC: Recent Labs  Lab 04/20/18 0325 04/22/18 0233 04/23/18 0225 04/24/18 0533 04/25/18 0256  WBC 19.3* 23.8* 21.3* 19.6* 11.4*  NEUTROABS  --  21.2*  --   --   --   HGB 11.8* 11.3* 11.1* 11.1* 11.3*  HCT 34.6* 33.3* 34.1* 33.8* 33.9*  MCV 91.8 91.7 91.7 92.9 92.6  PLT 342 287 263 279 284   Cardiac Enzymes: No results  for input(s): CKTOTAL, CKMB, CKMBINDEX, TROPONINI in the last 168 hours. BNP: Invalid input(s): POCBNP CBG: No results for input(s): GLUCAP in the last 168 hours. D-Dimer No results for input(s): DDIMER in the last 72 hours. Hgb A1c No results for input(s): HGBA1C in the last 72 hours. Lipid Profile No results for input(s): CHOL, HDL, LDLCALC, TRIG, CHOLHDL, LDLDIRECT in the last 72 hours. Thyroid function studies No results for input(s): TSH, T4TOTAL, T3FREE, THYROIDAB in the last 72 hours.  Invalid input(s): FREET3 Anemia work up No results for input(s): VITAMINB12, FOLATE, FERRITIN, TIBC, IRON, RETICCTPCT in the last 72 hours. Urinalysis    Component Value Date/Time   COLORURINE YELLOW 04/17/2018 1101   APPEARANCEUR CLEAR 04/17/2018 1101   LABSPEC 1.006 04/17/2018 1101   PHURINE 6.0 04/17/2018 1101   GLUCOSEU NEGATIVE 04/17/2018 1101   HGBUR MODERATE (A) 04/17/2018 1101   BILIRUBINUR NEGATIVE 04/17/2018 1101   KETONESUR NEGATIVE 04/17/2018 1101   PROTEINUR NEGATIVE 04/17/2018 1101   UROBILINOGEN 2.0 (H) 02/23/2014 0225   NITRITE NEGATIVE 04/17/2018 1101   LEUKOCYTESUR NEGATIVE 04/17/2018 1101   Sepsis Labs Invalid input(s): PROCALCITONIN,  WBC,  LACTICIDVEN Microbiology Recent Results (from the past 240 hour(s))  Culture, blood (x 2)     Status: None   Collection Time: 04/17/18  8:33 AM  Result Value Ref Range Status   Specimen Description BLOOD LEFT HAND  Final   Special Requests   Final    BOTTLES DRAWN AEROBIC ONLY Blood Culture adequate volume  Culture   Final    NO GROWTH 5 DAYS Performed at [facility], [place] [address], [place], [place] [address]    Report Status 04/22/2018 FINAL  Final  Culture, blood (x 2)     Status: None   Collection Time: 04/17/18  8:46 AM  Result Value Ref Range Status   Specimen Description BLOOD LEFT HAND  Final   Special Requests   Final    BOTTLES DRAWN AEROBIC ONLY Blood Culture results may not be optimal due to an  inadequate volume of blood received in culture bottles   Culture   Final    NO GROWTH 5 DAYS Performed at [facility], [place] [address], [place], [place] [address]    Report Status 04/22/2018 FINAL  Final  C difficile quick scan w PCR reflex     Status: Abnormal   Collection Time: 04/19/18  8:52 AM  Result Value Ref Range Status   C Diff antigen POSITIVE (A) NEGATIVE Final   C Diff toxin NEGATIVE NEGATIVE Final   C Diff interpretation Results are indeterminate. See PCR results.  Final  C. Diff by PCR, Reflexed     Status: None   Collection Time: 04/19/18  8:52 AM  Result Value Ref Range Status   Toxigenic C. Difficile by PCR NEGATIVE NEGATIVE Final    Comment: Patient is colonized with non toxigenic C. difficile. May not need treatment unless significant symptoms are present. Performed at [facility], [place] [address] [place], [place] [address]   Gastrointestinal Panel by PCR , Stool     Status: None   Collection Time: 04/23/18  3:17 AM  Result Value Ref Range Status   Campylobacter species NOT DETECTED NOT DETECTED Final   Plesimonas shigelloides NOT DETECTED NOT DETECTED Final   Salmonella species NOT DETECTED NOT DETECTED Final   Yersinia enterocolitica NOT DETECTED NOT DETECTED Final   Vibrio species NOT DETECTED NOT DETECTED Final   Vibrio cholerae NOT DETECTED NOT DETECTED Final   Enteroaggregative E coli (EAEC) NOT DETECTED NOT DETECTED Final   Enteropathogenic E coli (EPEC) NOT DETECTED NOT DETECTED Final   Enterotoxigenic E coli (ETEC) NOT DETECTED NOT DETECTED Final   Shiga like toxin producing E coli (STEC) NOT DETECTED NOT DETECTED Final   Shigella/Enteroinvasive E coli (EIEC) NOT DETECTED NOT DETECTED Final   Cryptosporidium NOT DETECTED NOT DETECTED Final   Cyclospora cayetanensis NOT DETECTED NOT DETECTED Final   Entamoeba histolytica NOT DETECTED NOT DETECTED Final   Giardia lamblia NOT DETECTED NOT DETECTED Final   Adenovirus F40/41 NOT DETECTED NOT  DETECTED Final   Astrovirus NOT DETECTED NOT DETECTED Final   Norovirus GI/GII NOT DETECTED NOT DETECTED Final   Rotavirus A NOT DETECTED NOT DETECTED Final   Sapovirus (I, II, IV, and V) NOT DETECTED NOT DETECTED Final    Comment: Performed at [facility], [address], [place], [place] [address]     Time coordinating discharge: 13  SIGNED:   Cristy Folks, MD  [facility] 04/26/2018, 10:08 AM  If 7PM-7AM, please contact night-coverage [email or web address] Password [email or web address]

## 2018-04-26 NOTE — Discharge Instructions (Signed)
Granulomatosis With Polyangiitis Granulomatosis with polyangiitis (GPA), also known as Wegener granulomatosis, is a rare disease of the blood vessels. It causes inflammation of small-sized and medium-sized blood vessels in many parts of the body. This inflammation can interfere with the function of organs that are supplied by the blood vessels. Organs that are commonly affected by this condition include the kidneys, lungs, nose, ears, skin, and eyes. The nervous system is also commonly affected. This condition can range from mild to severe, and it can develop gradually or very quickly (acute). In severe cases, the condition can cause serious damage to organs such as the lungs or kidneys. Early treatment can help to prevent damage to the organs that are involved. What are the causes? The cause of this condition is not known. It occurs when a persons defense system (immune system) mistakenly attacks the body's own blood vessels, which causes inflammation and damage. This makes GPA an autoimmune disease. What increases the risk? This condition is more likely to develop in:  People who are older than 76 years of age.  People who are of Caucasian ethnicity. What are the signs or symptoms? Symptoms can vary depending on which part of the body is involved.  General: ? Severe tiredness and weakness. ? Decreased appetite. ? Unintended weight loss. ? Fever or sweats. ? Joint pain or swelling. ? Muscle pain.  Nose: ? Nose or face pain. ? Runny nose. ? Crusts or sores in the nose. ? Nosebleeds.  Airway and lungs: ? Cough. ? Chest pain. ? Change in voice. ? Wheezing. ? Shortness of breath. ? Coughing up blood.  Eyes: ? Eye pain. ? Red eyes. ? Vision problems.  Ears: ? Hearing problems. Many other parts of the body can be involved. How is this diagnosed? This condition may be diagnosed with:  A physical exam and medical history.  Blood tests.  Urine tests.  Imaging tests, such  as X-rays or a CT scan.  Biopsy. A small tissue sample is taken from the nasal passages, lung, kidney, skin, or other affected areas for examination in a lab. You may be referred to a specialist. This condition can be confused with a number of other rare diseases. How is this treated? Aggressive treatment is important for this condition and can lead to remission. GPA is usually treated with:  Steroid medicines, such as prednisone.  Other medicines that prevent or slow down your immune system from causing further damage to your blood vessels. You may also be given antibiotic medicines to prevent infection and to help prevent the disease from returning. In severe cases, you may be given medicine directly into a vein through an IV tube. A type of blood transfusion (plasmapheresis) may also be done to remove immune system cells that are attacking your blood vessels. If the kidneys are badly damaged, a kidney transplant may be recommended. Because relapses are common, treatments may need to be adjusted. Follow these instructions at home:  Take over-the-counter and prescription medicines only as told by your health care provider.  If you were prescribed an antibiotic medicine, take it as told by your health care provider. Do not stop taking the antibiotic even if you start to feel better.  Follow instructions from your health care provider about testing for blood in your urine.  Take vitamins and supplements as told by your health care provider or dietitian.  Follow instructions from your health care provider about diet.Try to eat regular, healthy meals, even though some of your treatments  might affect your appetite.  Keep all follow-up visits as told by your health care provider. This is important. Contact a health care provider if:  Your symptoms do not improve in the time expected.  You develop any new symptoms or problems.  You have a fever.  You have nausea or diarrhea.  You  develop a rash.  You have a sore throat, white patches in your mouth, or difficulty swallowing.  You have severe fatigue.  You develop an infection. Get help right away if:  You have chest pain.  You feel short of breath.  You feel very light-headed, or you pass out.  You have pain, swelling, or redness anywhere in your legs.  You have blood in your urine.  You have uncontrollable bleeding, such as a nosebleed that will not stop.  You have sudden loss of vision or hearing.  You have decreased urination. This information is not intended to replace advice given to you by your health care provider. Make sure you discuss any questions you have with your health care provider. Document Released: 12/16/2006 Document Revised: 07/27/2015 Document Reviewed: 03/07/2014 Elsevier Interactive Patient Education  2019 [employer].

## 2018-04-26 NOTE — Plan of Care (Signed)
Care plan has been reviewed: Problem: Clinical Measurements :Warfarin toxicity and AKI. Goal: Ability to maintain clinical measurements within normal limits will improve Outcome: Progressing: we will follow lab result of kidney function and coagulation level daily.  Nephrologist and pharmacist were consulted.   Problem: Cardiac: Atrial fibrillation, when ambulated, HR 140-160, unsustained RVR on monitor. At rest, HR 80-90s. He has taken Metoprolol 75 mg BID and Cardizem 360 mg daily as his current schedule. Goal: Ability to achieve and maintain adequate cardiopulmonary perfusion will improve Outcome: Progressing: Pt was asymptomatically ambulated in his room. HR mostly under rate controlled, BP remained stable. Will continue to monitor.   Problem: Fluid Volume: Hx of diarrhea, malnutrition, alcohol abuse. Goal: Fluid volume balance will be maintained or improved Outcome: Progressing: no signs of fluid or volume depletion. He had good appetite today. Normal bowel movement today with out diarrhea.   Problem: Coping: alcohol abuse, poor judgment, poor skill of stress management. Goal: Level of anxiety will decrease Outcome: Progressing: CIWA assessment q 6 hrs, CIWA score was under controlled. Pt slept comfortably tonight. No signs of alcohol withdrawal syndrome.    Kennyth Lose, BSN,RN,PCCN-CMC

## 2018-04-26 NOTE — Progress Notes (Signed)
[facility] for warfarin Indication: atrial fibrillation  Allergies  Allergen Reactions  . Statins Swelling    Legs hurt   Labs: Recent Labs    04/24/18 0533 04/25/18 0256 04/26/18 0317  HGB 11.1* 11.3*  --   HCT 33.8* 33.9*  --   PLT 279 284  --   LABPROT 19.2* 17.6* 17.1*  INR 1.64 1.46 1.41  CREATININE 5.04* 4.65* 3.68*    Estimated Creatinine Clearance: 18.3 mL/min (A) (by C-G formula based on SCr of 3.68 mg/dL (H)).   Assessment: [name] on warfarin PTA for atrial fibrillation. Was instructed by Dr. Laural Golden to hold warfarin on 2/5 with elevated LFTs, weakness, and poor appetite. INR on admission was elevated at 9.01 despite holding warfarin. Pt now s/p vitamin K 5 mg IV x 1 on 2/11, 10mg  vit k on 2/20 along with 2 units of FFP.   INR 1.41 today after warfarin resumed yesterday. Discharge planned for today, pt to go home on prior home dosing below.  *PTA Dose 3.75mg  Tues/Thurs/Sat, 2.5mg  all other days  Goal of Therapy:  INR 2-3 Monitor platelets by anticoagulation protocol: Yes   Plan:  -Warfarin 3.75mg  Tues/Thurs/Sat, 2.5mg  all other days -Recommend INR check next Thurs-Fri  Arrie Senate, PharmD, BCPS Clinical Pharmacist [phone] Please check AMION for all [place] numbers 04/26/2018

## 2018-04-28 DIAGNOSIS — N184 Chronic kidney disease, stage 4 (severe): Secondary | ICD-10-CM | POA: Diagnosis not present

## 2018-04-30 DIAGNOSIS — N12 Tubulo-interstitial nephritis, not specified as acute or chronic: Secondary | ICD-10-CM | POA: Diagnosis not present

## 2018-04-30 DIAGNOSIS — I4821 Permanent atrial fibrillation: Secondary | ICD-10-CM | POA: Diagnosis not present

## 2018-04-30 DIAGNOSIS — N179 Acute kidney failure, unspecified: Secondary | ICD-10-CM | POA: Diagnosis not present

## 2018-04-30 DIAGNOSIS — E872 Acidosis: Secondary | ICD-10-CM | POA: Diagnosis not present

## 2018-05-05 ENCOUNTER — Encounter (INDEPENDENT_AMBULATORY_CARE_PROVIDER_SITE_OTHER): Payer: Self-pay | Admitting: Internal Medicine

## 2018-05-05 ENCOUNTER — Ambulatory Visit (INDEPENDENT_AMBULATORY_CARE_PROVIDER_SITE_OTHER): Payer: Medicare HMO | Admitting: Internal Medicine

## 2018-05-05 VITALS — BP 103/73 | HR 103 | Temp 97.7°F | Ht 66.0 in | Wt 196.5 lb

## 2018-05-05 DIAGNOSIS — R748 Abnormal levels of other serum enzymes: Secondary | ICD-10-CM

## 2018-05-05 LAB — CBC WITH DIFFERENTIAL/PLATELET
ABSOLUTE MONOCYTES: 502 {cells}/uL (ref 200–950)
Basophils Absolute: 56 cells/uL (ref 0–200)
Basophils Relative: 0.3 %
Eosinophils Absolute: 19 cells/uL (ref 15–500)
Eosinophils Relative: 0.1 %
HEMATOCRIT: 38.9 % (ref 38.5–50.0)
Hemoglobin: 13.4 g/dL (ref 13.2–17.1)
LYMPHS ABS: 1879 {cells}/uL (ref 850–3900)
MCH: 30.9 pg (ref 27.0–33.0)
MCHC: 34.4 g/dL (ref 32.0–36.0)
MCV: 89.8 fL (ref 80.0–100.0)
MPV: 11.4 fL (ref 7.5–12.5)
Monocytes Relative: 2.7 %
NEUTROS ABS: 16145 {cells}/uL — AB (ref 1500–7800)
Neutrophils Relative %: 86.8 %
Platelets: 305 10*3/uL (ref 140–400)
RBC: 4.33 10*6/uL (ref 4.20–5.80)
RDW: 13.5 % (ref 11.0–15.0)
Total Lymphocyte: 10.1 %
WBC: 18.6 10*3/uL — ABNORMAL HIGH (ref 3.8–10.8)

## 2018-05-05 LAB — COMPREHENSIVE METABOLIC PANEL
AG Ratio: 0.7 (calc) — ABNORMAL LOW (ref 1.0–2.5)
ALT: 105 U/L — ABNORMAL HIGH (ref 9–46)
AST: 46 U/L — ABNORMAL HIGH (ref 10–35)
Albumin: 2.6 g/dL — ABNORMAL LOW (ref 3.6–5.1)
Alkaline phosphatase (APISO): 195 U/L — ABNORMAL HIGH (ref 35–144)
BUN/Creatinine Ratio: 28 (calc) — ABNORMAL HIGH (ref 6–22)
BUN: 39 mg/dL — ABNORMAL HIGH (ref 7–25)
CO2: 27 mmol/L (ref 20–32)
Calcium: 7.7 mg/dL — ABNORMAL LOW (ref 8.6–10.3)
Chloride: 104 mmol/L (ref 98–110)
Creat: 1.39 mg/dL — ABNORMAL HIGH (ref 0.70–1.18)
Globulin: 3.7 g/dL (calc) (ref 1.9–3.7)
Glucose, Bld: 100 mg/dL (ref 65–139)
POTASSIUM: 4.1 mmol/L (ref 3.5–5.3)
Sodium: 138 mmol/L (ref 135–146)
Total Bilirubin: 0.9 mg/dL (ref 0.2–1.2)
Total Protein: 6.3 g/dL (ref 6.1–8.1)

## 2018-05-05 LAB — SEDIMENTATION RATE: SED RATE: 22 mm/h — AB (ref 0–20)

## 2018-05-05 NOTE — Progress Notes (Signed)
Subjective:    Patient ID: Daniel Morse, male    DOB: 07/21/1940, 78 y.o.   MRN: [id number]  HPI Here today for f/u. Last seen by Dr. Laural Golden in February for elevated liver enzymes. No etoh since 2019. Quit in December of 2019. Had been drinking a couple of drinks a day. Appetite is okay. He has lost from 207 to 196.5. He says he feels great. He says he has weakness in his legs. His appetite is much better. BMs are moving normal.   Hx of atrial fib and maintained on Warfarin.  04/23/2018 total bili 1.5, AST 34, ALT 25, ALT 25.   Ultrasound on 04/03/2018 revealed hepatic steatosis but no evidence of gallstones or dilated bile duct.  He also had 21 mm cyst in left kidney.  CMP Latest Ref Rng & Units 04/26/2018 04/25/2018 04/24/2018  Glucose 70 - 99 mg/dL 154(H) 173(H) 84  BUN 8 - 23 mg/dL 71(H) 64(H) 60(H)  Creatinine 0.61 - 1.24 mg/dL 3.68(H) 4.65(H) 5.04(H)  Sodium 135 - 145 mmol/L 135 135 134(L)  Potassium 3.5 - 5.1 mmol/L 3.2(L) 3.7 3.3(L)  Chloride 98 - 111 mmol/L 97(L) 99 98  CO2 22 - 32 mmol/L $RemoveB'25 26 23  'nhtbqNak$ Calcium 8.9 - 10.3 mg/dL 7.8(L) 7.6(L) 7.6(L)  Total Protein 6.5 - 8.1 g/dL - - -  Total Bilirubin 0.3 - 1.2 mg/dL - - -  Alkaline Phos 38 - 126 U/L - - -  AST 15 - 41 U/L - - -  ALT 0 - 44 U/L - - -        Review of Systems Past Medical History:  Diagnosis Date  . Atrial flutter ([place]) 06/2010  . Cerebrovascular disease 2008   Left CEA  . Chest pain    negative stress nuclear in 11/2002  . Chronic anticoagulation   . Dysrhythmia   . GERD (gastroesophageal reflux disease)    h/o esophagitis  . History of colonic polyps    Colonoscopy and polypectomy in 03/2011  . History of kidney stones   . Hyperlipidemia    Metabolic syndrome with increased triglycerides  . Hypertension   . Macular degeneration    legally blind  . Nephrolithiasis   . Peripheral vascular disease ([place])   . Tobacco abuse    50 pack years; 1/2 pack per day    Past Surgical History:  Procedure  Laterality Date  . CAROTID ENDARTERECTOMY  2008   Left  . COLONOSCOPY  04/04/2011   Rogene Houston, MD;  multiple polyps ablated  . COLONOSCOPY N/A 06/12/2016   Procedure: COLONOSCOPY;  Surgeon: Rogene Houston, MD;  Location: [facility];  Service: Endoscopy;  Laterality: N/A;  1030  . CYSTOSCOPY W/ URETERAL STENT PLACEMENT     and stone extraction    Allergies  Allergen Reactions  . Statins Swelling    Legs hurt    Current Outpatient Medications on File Prior to Visit  Medication Sig Dispense Refill  . diltiazem (CARDIZEM CD) 360 MG 24 hr capsule Take 1 capsule (360 mg total) by mouth daily. 30 capsule 0  . levothyroxine (SYNTHROID, LEVOTHROID) 50 MCG tablet Take 50 mcg by mouth every morning.    . metoprolol tartrate 75 MG TABS Take 75 mg by mouth 2 (two) times daily for 30 days. 60 tablet 0  . pantoprazole (PROTONIX) 40 MG tablet TAKE 1 TABLET BY MOUTH ONCE DAILY (Patient taking differently: Take 40 mg by mouth. ) 90 tablet 3  . predniSONE (DELTASONE)  10 MG tablet Take 40 mg by mouth daily with breakfast.    . Specialty Vitamins Products (ICAPS LUTEIN-ZEAXANTHIN PO) Take 1 tablet by mouth 2 (two) times daily.     Marland Kitchen warfarin (COUMADIN) 2.5 MG tablet Take 1 tablet daily except 1 1/2 tablets on Tuesdays, Thursdays and Saturdays (Patient taking differently: Take 2.5-3.75 mg by mouth See admin instructions. Take 1 tablet daily except 1 1/2 tablets on Tuesdays, Thursdays and Saturdays) 114 tablet 4   No current facility-administered medications on file prior to visit.         Objective:   Physical Exam Blood pressure 103/73, pulse (!) 103, temperature 97.7 F (36.5 C), height $RemoveBe'5\' 6"'ORktIYlUa$  (1.676 m), weight 196 lb 8 oz (89.1 kg). Alert and oriented. Skin warm and dry. Oral mucosa is moist.   . Sclera anicteric, conjunctivae is pink. Thyroid not enlarged. No cervical lymphadenopathy. Lungs clear. Heart regular rate and rhythm.  Abdomen is soft. Bowel sounds are positive. No hepatomegaly. No  abdominal masses felt. No tenderness.  No edema to lower extremities.          Assessment & Plan:  Elevated liver enzymes. Last enzymes in February were normal.  Am going to recheck today, CBC.sedrate.  OV in 3 months.

## 2018-05-05 NOTE — Patient Instructions (Signed)
Labs. OV in 4 months.

## 2018-05-06 ENCOUNTER — Ambulatory Visit (INDEPENDENT_AMBULATORY_CARE_PROVIDER_SITE_OTHER): Payer: Medicare HMO | Admitting: *Deleted

## 2018-05-06 ENCOUNTER — Other Ambulatory Visit (HOSPITAL_COMMUNITY)
Admission: RE | Admit: 2018-05-06 | Discharge: 2018-05-06 | Disposition: A | Discharge status: Home / Self Care | Payer: Medicare HMO | Source: Ambulatory Visit | Attending: Cardiology | Admitting: Cardiology

## 2018-05-06 ENCOUNTER — Encounter (HOSPITAL_COMMUNITY): Payer: Self-pay | Admitting: Nephrology

## 2018-05-06 DIAGNOSIS — I4891 Unspecified atrial fibrillation: Secondary | ICD-10-CM | POA: Insufficient documentation

## 2018-05-06 DIAGNOSIS — I679 Cerebrovascular disease, unspecified: Secondary | ICD-10-CM

## 2018-05-06 DIAGNOSIS — Z5181 Encounter for therapeutic drug level monitoring: Secondary | ICD-10-CM

## 2018-05-06 LAB — POCT INR: INR: 8 — AB (ref 2.0–3.0)

## 2018-05-06 LAB — PROTIME-INR
INR: 7 (ref 0.8–1.2)
Prothrombin Time: 59.3 seconds — ABNORMAL HIGH (ref 11.4–15.2)

## 2018-05-06 NOTE — Patient Instructions (Signed)
POC INR >8.0  Sent to lab for stat INR  Results 7.0 Hold coumadin and recheck INR on 05/11/18  Recheck on Monday Pt told and he verbalized understanding.  He is on prednisone $RemoveBefor'40mg'CwBwSkHoQPPe$  daily now.

## 2018-05-08 ENCOUNTER — Encounter (INDEPENDENT_AMBULATORY_CARE_PROVIDER_SITE_OTHER): Payer: Self-pay | Admitting: *Deleted

## 2018-05-08 ENCOUNTER — Other Ambulatory Visit (INDEPENDENT_AMBULATORY_CARE_PROVIDER_SITE_OTHER): Payer: Self-pay | Admitting: *Deleted

## 2018-05-08 DIAGNOSIS — R748 Abnormal levels of other serum enzymes: Secondary | ICD-10-CM

## 2018-05-11 ENCOUNTER — Ambulatory Visit (INDEPENDENT_AMBULATORY_CARE_PROVIDER_SITE_OTHER): Payer: Medicare HMO | Admitting: *Deleted

## 2018-05-11 DIAGNOSIS — I480 Paroxysmal atrial fibrillation: Secondary | ICD-10-CM | POA: Diagnosis not present

## 2018-05-11 DIAGNOSIS — Z5181 Encounter for therapeutic drug level monitoring: Secondary | ICD-10-CM | POA: Diagnosis not present

## 2018-05-11 DIAGNOSIS — I679 Cerebrovascular disease, unspecified: Secondary | ICD-10-CM | POA: Diagnosis not present

## 2018-05-11 DIAGNOSIS — R748 Abnormal levels of other serum enzymes: Secondary | ICD-10-CM | POA: Diagnosis not present

## 2018-05-11 LAB — POCT INR: INR: 1.9 — AB (ref 2.0–3.0)

## 2018-05-11 NOTE — Patient Instructions (Signed)
Restart coumadin 1 tablet daily  Recheck on Monday 3/16 Pt told and he verbalized understanding.  He is on prednisone $RemoveBefor'20mg'yXnyvxeUddQB$  daily now.

## 2018-05-12 ENCOUNTER — Telehealth: Payer: Self-pay | Admitting: *Deleted

## 2018-05-12 LAB — HEPATIC FUNCTION PANEL
ALK PHOS: 157 IU/L — AB (ref 39–117)
ALT: 69 IU/L — ABNORMAL HIGH (ref 0–44)
AST: 34 IU/L (ref 0–40)
Albumin: 2.6 g/dL — ABNORMAL LOW (ref 3.7–4.7)
Bilirubin Total: 0.8 mg/dL (ref 0.0–1.2)
Bilirubin, Direct: 0.33 mg/dL (ref 0.00–0.40)
Total Protein: 5.2 g/dL — ABNORMAL LOW (ref 6.0–8.5)

## 2018-05-12 LAB — CBC
Hematocrit: 25 % — ABNORMAL LOW (ref 37.5–51.0)
Hemoglobin: 8.5 g/dL — ABNORMAL LOW (ref 13.0–17.7)
MCH: 31.1 pg (ref 26.6–33.0)
MCHC: 34 g/dL (ref 31.5–35.7)
MCV: 92 fL (ref 79–97)
Platelets: 223 10*3/uL (ref 150–450)
RBC: 2.73 x10E6/uL — CL (ref 4.14–5.80)
RDW: 15.4 % (ref 11.6–15.4)
WBC: 18 10*3/uL — ABNORMAL HIGH (ref 3.4–10.8)

## 2018-05-12 NOTE — Telephone Encounter (Signed)
Patient's wife Vaughan Basta) called asking to speak with Edrick Oh, RN no message given . Requested to be called soon.

## 2018-05-13 ENCOUNTER — Other Ambulatory Visit (INDEPENDENT_AMBULATORY_CARE_PROVIDER_SITE_OTHER): Payer: Self-pay | Admitting: *Deleted

## 2018-05-13 ENCOUNTER — Ambulatory Visit (INDEPENDENT_AMBULATORY_CARE_PROVIDER_SITE_OTHER): Payer: Medicare HMO | Admitting: *Deleted

## 2018-05-13 ENCOUNTER — Telehealth: Payer: Self-pay | Admitting: *Deleted

## 2018-05-13 ENCOUNTER — Other Ambulatory Visit: Payer: Self-pay

## 2018-05-13 DIAGNOSIS — I679 Cerebrovascular disease, unspecified: Secondary | ICD-10-CM | POA: Diagnosis not present

## 2018-05-13 DIAGNOSIS — I4821 Permanent atrial fibrillation: Secondary | ICD-10-CM | POA: Diagnosis not present

## 2018-05-13 DIAGNOSIS — D649 Anemia, unspecified: Secondary | ICD-10-CM

## 2018-05-13 DIAGNOSIS — Z5181 Encounter for therapeutic drug level monitoring: Secondary | ICD-10-CM | POA: Diagnosis not present

## 2018-05-13 LAB — POCT INR: INR: 1.4 — AB (ref 2.0–3.0)

## 2018-05-13 NOTE — Patient Instructions (Signed)
Called yesterday c/o bright red blood in stool.  Told pt to hold coumadin last night and call Dr Olevia Perches office.  Pt called back this morning stating stool was black this morning.  No red blood.  Did not call Dr Laural Golden yesterday.  Requested to come by office for INR before going by [facility] office. INR 1.4 Hold coumadin until advised by Dr Laural Golden.  Call me back with report

## 2018-05-13 NOTE — Telephone Encounter (Signed)
Pt is going to Dr Olevia Perches office to discuss rectal bleeding and wants to come by here first for an INR.  Told pt that would be fine.  I would be glad to check INR.

## 2018-05-13 NOTE — Telephone Encounter (Signed)
Called stating patient had bright red blood in stool this morning when he had a bowel movement.  Told pt he needed to call Dr Olevia Perches office or come to ED if bleeding continues.  He states Dr Olevia Perches office is suppose to call him with results of blood work.  He will discuss with him them.  Told pt to hold coumadin tonight and call tomorrow (Wed) and let me know how he is doing.  He agreed.  Refused to go to ED at the time.

## 2018-05-13 NOTE — Telephone Encounter (Signed)
Please give pt's wife a call-- he's bleeding and she is needing to speak with you.

## 2018-05-14 ENCOUNTER — Ambulatory Visit (INDEPENDENT_AMBULATORY_CARE_PROVIDER_SITE_OTHER): Payer: Medicare HMO | Admitting: Internal Medicine

## 2018-05-14 ENCOUNTER — Encounter (INDEPENDENT_AMBULATORY_CARE_PROVIDER_SITE_OTHER): Payer: Self-pay | Admitting: Internal Medicine

## 2018-05-14 ENCOUNTER — Other Ambulatory Visit (INDEPENDENT_AMBULATORY_CARE_PROVIDER_SITE_OTHER): Payer: Self-pay | Admitting: *Deleted

## 2018-05-14 VITALS — BP 109/64 | HR 111 | Temp 98.0°F | Resp 18 | Ht 66.0 in | Wt 202.2 lb

## 2018-05-14 DIAGNOSIS — D62 Acute posthemorrhagic anemia: Secondary | ICD-10-CM

## 2018-05-14 DIAGNOSIS — K921 Melena: Secondary | ICD-10-CM

## 2018-05-14 LAB — HEMOGLOBIN AND HEMATOCRIT, BLOOD
Hematocrit: 24.6 % — ABNORMAL LOW (ref 37.5–51.0)
Hemoglobin: 8.2 g/dL — ABNORMAL LOW (ref 13.0–17.7)

## 2018-05-14 MED ORDER — FAMOTIDINE 20 MG PO TABS: 20.0000 mg | ORAL_TABLET | Freq: Two times a day (BID) | ORAL | 5 refills | Status: AC

## 2018-05-14 NOTE — Patient Instructions (Signed)
Physician will call with results of blood work to be done on 05/15/2018. These report to emergency room if your stool turns black again or if you get lightheaded.

## 2018-05-14 NOTE — Progress Notes (Signed)
Presenting complaint;  Melena.  Database sent subjective:  Patient is 78 year old Caucasian male who has chronic GERD and has been maintained on pantoprazole was seen in the office by me on 04/06/2018 for elevated transaminases.  He has history of chronic alcohol abuse but he had quit drinking several weeks earlier.  Ultrasound prior to his visit revealed mildly echogenic liver suggesting steatosis but no other abnormalities were noted.  His sed rate was 91.  ANA was negative.  He had anorexia and weight loss and I was concerned that he may have autoimmune hepatitis.  He was therefore scheduled for liver biopsy.  Per recommendation by his cardiologist warfarin was discontinued for liver biopsy but INR prior liver biopsy was supratherapeutic.  He was therefore seen in emergency room and also noted to have serum creatinine over 8.  He was felt to have acute kidney injury and was transferred to [facility].  Once his coagulopathy was corrected he underwent liver biopsy which revealed the following.  PLASMA CELL RICH SEVERE INTERSTITIAL NEPHRITIS, IGG4 NEGATIVE, MONOTYPIC FOR IGG1 SUBCLASS. NO MONOCLONALITY IDENTIFIED. 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 SEVERE ARTERIOSCLEROSIS. NO EVIDENCE OF A NECROTIZING OR CRESCENTIC GLOMERULONEPHRITIS; NO EVIDENCE OF AN IMMUNE COMPLEX MEDIATED OR ACTIVE GLOMERULONEPHRITIS.  MPO ANCA antibody was positive.  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 Pearson Grippe of nephrology with a rapid improvement in his renal function.  Regarding his LFT abnormality was seen in the office 11 days ago by Ms. Deberah Castle, NP. He had blood drawn   His AST was 46 and ALT was 105 with alkaline phosphatase of 195.  His albumin was 2.6. His hemoglobin was 13.3.  Plans were made to repeat his LFTs in few weeks. Lab studies were repeated  on 05/11/2018 as planned and his hemoglobin was 8.5 g.  His AST had decreased to 34 and ALT was 69. When patient was contacted with the results he mentioned that he had bright red blood per rectum followed by melena.  He was advised to discontinue warfarin. He says his stool is not black anymore.  It is dark brown.  He has not experienced abdominal pain nausea vomiting.  He remains on pantoprazole at 40 mg every other day.  He is willing to be switched to famotidine as there was question of renal injury because of PPI. He has noted postural lightheadedness.  He has not experienced chest pain or shortness of breath.  He has good appetite.  Since he was seen in the office about 5 weeks ago he has lost 4 pounds.  He wants to get down to 200 pounds. Patient has decided on his own that he would not go back on warfarin and will start taking aspirin as soon as he is cleared from GI standpoint.   Current Medications: Outpatient Encounter Medications as of 05/14/2018  Medication Sig  . diltiazem (CARDIZEM CD) 360 MG 24 hr capsule Take 1 capsule (360 mg total) by mouth daily.  Marland Kitchen levothyroxine (SYNTHROID, LEVOTHROID) 50 MCG tablet Take 50 mcg by mouth every morning.  . metoprolol tartrate 75 MG TABS Take 75 mg by mouth 2 (two) times daily for 30 days.  . pantoprazole (PROTONIX) 40 MG tablet TAKE 1 TABLET BY MOUTH ONCE DAILY (Patient taking differently: Take 40 mg by mouth. )  . predniSONE (DELTASONE) 10 MG tablet Take 10 mg by mouth daily with breakfast.   .  Specialty Vitamins Products (ICAPS LUTEIN-ZEAXANTHIN PO) Take 1 tablet by mouth 2 (two) times daily.   Marland Kitchen warfarin (COUMADIN) 2.5 MG tablet Take 1 tablet daily except 1 1/2 tablets on Tuesdays, Thursdays and Saturdays (Patient taking differently: Take 2.5-3.75 mg by mouth See admin instructions. Take 1 tablet daily except 1 1/2 tablets on Tuesdays, Thursdays and Saturdays)   No facility-administered encounter medications on file as of 05/14/2018.       Objective: Blood pressure 109/64, pulse (!) 111, temperature 98 F (36.7 C), temperature source Oral, resp. rate 18, height _0  (1.676 m), weight 202 lb 3.2 oz (91.7 kg). Patient is alert and in no acute distress. He appears pale.  So is his conjunctivae.  Sclera is nonicteric Oropharyngeal mucosa is normal. No neck masses or thyromegaly noted. Cardiac exam with iregular rhythm normal S1 and S2. Lungs are clear to auscultation. Abdomen is full.  Bowel sounds are normal.  On palpation is soft and nontender with organomegaly or masses.  Liver edge is indistinct below RCM. Rectal examination reveals normal sphincter tone.  He has dark brown stool in the vault.  Stool is guaiac positive. No LE edema or clubbing noted.  Labs/studies Results:  CBC Latest Ref Rng & Units 05/13/2018 05/11/2018 05/05/2018  WBC 3.4 - 10.8 x10E3/uL - 18.0(H) 18.6(H)  Hemoglobin 13.0 - 17.7 g/dL 8.2(L) 8.5(L) 13.4  Hematocrit 37.5 - 51.0 % 24.6(L) 25.0(L) 38.9  Platelets 150 - 450 x10E3/uL - 223 305    CMP Latest Ref Rng & Units 05/11/2018 05/05/2018 04/26/2018  Glucose 65 - 139 mg/dL - 100 154(H)  BUN 7 - 25 mg/dL - 39(H) 71(H)  Creatinine 0.70 - 1.18 mg/dL - 1.39(H) 3.68(H)  Sodium 135 - 146 mmol/L - 138 135  Potassium 3.5 - 5.3 mmol/L - 4.1 3.2(L)  Chloride 98 - 110 mmol/L - 104 97(L)  CO2 20 - 32 mmol/L - 27 25  Calcium 8.6 - 10.3 mg/dL - 7.7(L) 7.8(L)  Total Protein 6.0 - 8.5 g/dL 5.2(L) 6.3 -  Total Bilirubin 0.0 - 1.2 mg/dL 0.8 0.9 -  Alkaline Phos 39 - 117 IU/L 157(H) - -  AST 0 - 40 IU/L 34 46(H) -  ALT 0 - 44 IU/L 69(H) 105(H) -    Hepatic Function Latest Ref Rng & Units 05/11/2018 05/05/2018 04/26/2018  Total Protein 6.0 - 8.5 g/dL 5.2(L) 6.3 -  Albumin 3.7 - 4.7 g/dL 2.6(L) - 1.7(L)  AST 0 - 40 IU/L 34 46(H) -  ALT 0 - 44 IU/L 69(H) 105(H) -  Alk Phosphatase 39 - 117 IU/L 157(H) - -  Total Bilirubin 0.0 - 1.2 mg/dL 0.8 0.9 -  Bilirubin, Direct 0.00 - 0.40 mg/dL 0.33 - -   Assessment:  #1.   Melena and anemia.  It appears he experienced overt GI bleed in the setting of anticoagulation.  His INR in fact was subtherapeutic.  Last colonoscopy was in April 2018 revealing colonic diverticulosis which may well be the source of bleeding but he could have peptic ulcer disease in the setting of recent severe illness with AKI.  At this point he is not sure that he wants to proceed with EGD.  If he does not want to undergo EGD small bowel given capsule study would be reasonable next step.  #2.  Elevated transaminases.  While he has been on prednisone his transaminases have come down which suggest autoimmune process.  It is interesting though that he had autoimmune process resulting in acute renal injury.  He  has not had any alcohol in over 3 months.  Furthermore AST/ALT ratio pattern is not consistent with alcoholic hepatitis.   Plan:  Patient will go to the lab for H&H tomorrow morning. In the meantime if he has another episode of melena or rectal bleeding he should immediately report to the emergency room. He should also consider going to emergency room if he develops shortness of breath or postural lightheadedness. He will discontinue pantoprazole and begin famotidine 20 mg p.o. twice daily. He will continue to hold warfarin for now. Further recommendations to follow.

## 2018-05-15 ENCOUNTER — Other Ambulatory Visit: Payer: Self-pay

## 2018-05-15 ENCOUNTER — Other Ambulatory Visit (HOSPITAL_COMMUNITY)
Admission: RE | Admit: 2018-05-15 | Discharge: 2018-05-15 | Disposition: A | Discharge status: Home / Self Care | Payer: Medicare HMO | Source: Ambulatory Visit | Attending: Internal Medicine | Admitting: Internal Medicine

## 2018-05-15 DIAGNOSIS — K921 Melena: Secondary | ICD-10-CM | POA: Diagnosis not present

## 2018-05-15 DIAGNOSIS — D62 Acute posthemorrhagic anemia: Secondary | ICD-10-CM | POA: Diagnosis not present

## 2018-05-15 LAB — HEMOGLOBIN AND HEMATOCRIT, BLOOD
HCT: 25.6 % — ABNORMAL LOW (ref 39.0–52.0)
Hemoglobin: 8.2 g/dL — ABNORMAL LOW (ref 13.0–17.0)

## 2018-05-18 ENCOUNTER — Telehealth: Payer: Self-pay

## 2018-05-18 ENCOUNTER — Other Ambulatory Visit (INDEPENDENT_AMBULATORY_CARE_PROVIDER_SITE_OTHER): Payer: Self-pay | Admitting: *Deleted

## 2018-05-18 DIAGNOSIS — D649 Anemia, unspecified: Secondary | ICD-10-CM

## 2018-05-18 NOTE — Telephone Encounter (Signed)
We having pt taking 50 mg from last visit.What dose shall we refill for patient

## 2018-05-19 ENCOUNTER — Telehealth: Payer: Self-pay | Admitting: Cardiology

## 2018-05-19 ENCOUNTER — Ambulatory Visit: Payer: Medicare HMO | Admitting: Cardiology

## 2018-05-19 MED ORDER — METOPROLOL TARTRATE 75 MG PO TABS: 75.0000 mg | ORAL_TABLET | Freq: Two times a day (BID) | ORAL | 0 refills | Status: DC

## 2018-05-19 NOTE — Telephone Encounter (Signed)
Wife notified that dose at discharged was 75 mg two times daily

## 2018-05-19 NOTE — Telephone Encounter (Signed)
Pharmacy wanted refill for metoprolol tartrate 75 mg, written by a different doctor.We had him on 50 mg BID. Looks like some one else refilled it already today though

## 2018-05-19 NOTE — Telephone Encounter (Signed)
Needs refill for Metoprolol sent to to [facility] / tg

## 2018-05-19 NOTE — Telephone Encounter (Signed)
I don't understand your message?   Zandra Abts MD

## 2018-05-19 NOTE — Telephone Encounter (Signed)
Please call patient to confirm dose of metoprolol / tg

## 2018-05-20 ENCOUNTER — Telehealth: Payer: Self-pay | Admitting: *Deleted

## 2018-05-20 ENCOUNTER — Encounter (HOSPITAL_COMMUNITY)
Admission: RE | Disposition: A | Discharge status: Home / Self Care | Payer: Self-pay | Source: Home / Self Care | Attending: Internal Medicine

## 2018-05-20 ENCOUNTER — Ambulatory Visit (HOSPITAL_COMMUNITY)
Admission: RE | Admit: 2018-05-20 | Discharge: 2018-05-20 | Disposition: A | Discharge status: Home / Self Care | Payer: Medicare HMO | Attending: Internal Medicine | Admitting: Internal Medicine

## 2018-05-20 ENCOUNTER — Other Ambulatory Visit: Payer: Self-pay

## 2018-05-20 DIAGNOSIS — D649 Anemia, unspecified: Secondary | ICD-10-CM

## 2018-05-20 DIAGNOSIS — K2210 Ulcer of esophagus without bleeding: Secondary | ICD-10-CM | POA: Diagnosis not present

## 2018-05-20 DIAGNOSIS — K921 Melena: Secondary | ICD-10-CM | POA: Diagnosis not present

## 2018-05-20 HISTORY — PX: GIVENS CAPSULE STUDY: SHX5432

## 2018-05-20 SURGERY — IMAGING PROCEDURE, GI TRACT, INTRALUMINAL, VIA CAPSULE
Anesthesia: Moderate Sedation

## 2018-05-20 NOTE — Telephone Encounter (Signed)
Spoke with wife who states that the pt was told at discharge to continue Diltiazem 60 mg. Informed wife that discharge summary states to take Diltiazem 360 mg daily. Wife states that when the pt takes Diltiazem 360 mg he becomes dizzy. Please advise.

## 2018-05-21 MED ORDER — DILTIAZEM HCL 60 MG PO TABS: 60.0000 mg | ORAL_TABLET | Freq: Two times a day (BID) | ORAL | 3 refills | Status: AC

## 2018-05-21 NOTE — Telephone Encounter (Signed)
If dizzy on dilt 360 can stop, go back to his short acting diltiazem $RemoveBeforeDE'60mg'[id number]$  bid   Zandra Abts MD

## 2018-05-21 NOTE — Telephone Encounter (Signed)
Spoke with pt. Informed to stop Diltiazem 360 and start Diltiazem 60 mg two times daily. Pt voiced understanding.

## 2018-05-22 ENCOUNTER — Other Ambulatory Visit (INDEPENDENT_AMBULATORY_CARE_PROVIDER_SITE_OTHER): Payer: Self-pay | Admitting: *Deleted

## 2018-05-22 ENCOUNTER — Encounter (HOSPITAL_COMMUNITY): Payer: Self-pay | Admitting: Internal Medicine

## 2018-05-22 ENCOUNTER — Other Ambulatory Visit (HOSPITAL_COMMUNITY)
Admission: RE | Admit: 2018-05-22 | Discharge: 2018-05-22 | Disposition: A | Discharge status: Home / Self Care | Payer: Medicare HMO | Source: Ambulatory Visit | Attending: Internal Medicine | Admitting: Internal Medicine

## 2018-05-22 ENCOUNTER — Other Ambulatory Visit: Payer: Self-pay

## 2018-05-22 DIAGNOSIS — D649 Anemia, unspecified: Secondary | ICD-10-CM | POA: Insufficient documentation

## 2018-05-22 LAB — HEMOGLOBIN AND HEMATOCRIT, BLOOD
HCT: 31.3 % — ABNORMAL LOW (ref 39.0–52.0)
Hemoglobin: 9.8 g/dL — ABNORMAL LOW (ref 13.0–17.0)

## 2018-05-22 NOTE — Op Note (Signed)
Small Bowel Givens Capsule Study Procedure date: 05/20/2018.  Referring Provider: Dr. Hilma Favors. PCP:  Dr. Sharilyn Sites, MD  Indication for procedure:    Patient is 78 year old Caucasian male who experienced melena in the setting of anticoagulation.  Warfarin is on hold.  His hemoglobin dropped by almost 5 g to 8.5.  Have EGD but agreed to undergo a small bowel given capsule study.   Findings:   Patient was able to swallow given capsule without any difficulty. Study duration 8 hours. Small distal esophageal ulcer with clean base. Swollen duodenal fold with central erosion.  No stigmata of bleed. No small bowel mucosa.  First Gastric image: 43 seconds First Duodenal image: 1 minute First Ileo-Cecal Valve image: 5 hours 7 minutes and 9 seconds First Cecal image: 5 hours 7 minutes and 14 seconds Gastric Passage time: 17 seconds Small Bowel Passage time: 5 hours 6 minutes and 9 seconds.  Summary & Recommendations:  Distal esophageal ulcer with clean base. Prominent expanded duodenal mucosal fold with central depression suggestive of erosion but without stigmata of bleeding. If patient experiences melena again would recommend esophagogastroduodenoscopy. In the meantime we will continue to monitor H&H until it returns to normal.

## 2018-05-25 ENCOUNTER — Other Ambulatory Visit (INDEPENDENT_AMBULATORY_CARE_PROVIDER_SITE_OTHER): Payer: Self-pay | Admitting: *Deleted

## 2018-05-25 ENCOUNTER — Encounter (INDEPENDENT_AMBULATORY_CARE_PROVIDER_SITE_OTHER): Payer: Self-pay | Admitting: *Deleted

## 2018-05-25 DIAGNOSIS — D649 Anemia, unspecified: Secondary | ICD-10-CM

## 2018-05-25 DIAGNOSIS — N184 Chronic kidney disease, stage 4 (severe): Secondary | ICD-10-CM | POA: Diagnosis not present

## 2018-06-08 DIAGNOSIS — D649 Anemia, unspecified: Secondary | ICD-10-CM | POA: Diagnosis not present

## 2018-06-08 LAB — HEMOGLOBIN AND HEMATOCRIT, BLOOD
HCT: 37.9 % — ABNORMAL LOW (ref 38.5–50.0)
Hemoglobin: 12.6 g/dL — ABNORMAL LOW (ref 13.2–17.1)

## 2018-06-09 ENCOUNTER — Other Ambulatory Visit (INDEPENDENT_AMBULATORY_CARE_PROVIDER_SITE_OTHER): Payer: Self-pay | Admitting: *Deleted

## 2018-06-09 DIAGNOSIS — D649 Anemia, unspecified: Secondary | ICD-10-CM

## 2018-06-16 ENCOUNTER — Encounter (HOSPITAL_COMMUNITY): Payer: Self-pay | Admitting: Emergency Medicine

## 2018-06-16 ENCOUNTER — Emergency Department (HOSPITAL_COMMUNITY)
Admission: EM | Admit: 2018-06-16 | Discharge: 2018-06-16 | Disposition: A | Discharge status: Home / Self Care | Payer: Medicare HMO | Attending: Emergency Medicine | Admitting: Emergency Medicine

## 2018-06-16 ENCOUNTER — Emergency Department (HOSPITAL_BASED_OUTPATIENT_CLINIC_OR_DEPARTMENT_OTHER): Payer: Medicare HMO

## 2018-06-16 ENCOUNTER — Other Ambulatory Visit: Payer: Self-pay

## 2018-06-16 DIAGNOSIS — I10 Essential (primary) hypertension: Secondary | ICD-10-CM | POA: Diagnosis not present

## 2018-06-16 DIAGNOSIS — I739 Peripheral vascular disease, unspecified: Secondary | ICD-10-CM | POA: Diagnosis not present

## 2018-06-16 DIAGNOSIS — I82409 Acute embolism and thrombosis of unspecified deep veins of unspecified lower extremity: Secondary | ICD-10-CM

## 2018-06-16 DIAGNOSIS — Z79899 Other long term (current) drug therapy: Secondary | ICD-10-CM | POA: Insufficient documentation

## 2018-06-16 DIAGNOSIS — R0689 Other abnormalities of breathing: Secondary | ICD-10-CM | POA: Diagnosis not present

## 2018-06-16 DIAGNOSIS — Z87891 Personal history of nicotine dependence: Secondary | ICD-10-CM | POA: Diagnosis not present

## 2018-06-16 DIAGNOSIS — R52 Pain, unspecified: Secondary | ICD-10-CM

## 2018-06-16 DIAGNOSIS — I82441 Acute embolism and thrombosis of right tibial vein: Secondary | ICD-10-CM | POA: Insufficient documentation

## 2018-06-16 DIAGNOSIS — Z7901 Long term (current) use of anticoagulants: Secondary | ICD-10-CM | POA: Insufficient documentation

## 2018-06-16 DIAGNOSIS — M79661 Pain in right lower leg: Secondary | ICD-10-CM | POA: Diagnosis not present

## 2018-06-16 DIAGNOSIS — I824Z1 Acute embolism and thrombosis of unspecified deep veins of right distal lower extremity: Secondary | ICD-10-CM | POA: Diagnosis not present

## 2018-06-16 DIAGNOSIS — M79604 Pain in right leg: Secondary | ICD-10-CM

## 2018-06-16 DIAGNOSIS — I4891 Unspecified atrial fibrillation: Secondary | ICD-10-CM | POA: Diagnosis not present

## 2018-06-16 HISTORY — DX: Unspecified visual loss: H54.7

## 2018-06-16 LAB — BASIC METABOLIC PANEL
Anion gap: 7 (ref 5–15)
BUN: 24 mg/dL — ABNORMAL HIGH (ref 8–23)
CO2: 23 mmol/L (ref 22–32)
Calcium: 8.2 mg/dL — ABNORMAL LOW (ref 8.9–10.3)
Chloride: 106 mmol/L (ref 98–111)
Creatinine, Ser: 0.97 mg/dL (ref 0.61–1.24)
GFR calc Af Amer: >60 mL/min (ref >60)
GFR calc non Af Amer: >60 mL/min (ref >60)
Glucose, Bld: 108 mg/dL — ABNORMAL HIGH (ref 70–99)
Potassium: 4.3 mmol/L (ref 3.5–5.1)
Sodium: 136 mmol/L (ref 135–145)

## 2018-06-16 LAB — PROTIME-INR
INR: 1 (ref 0.8–1.2)
Prothrombin Time: 13.3 seconds (ref 11.4–15.2)

## 2018-06-16 LAB — CBC WITH DIFFERENTIAL/PLATELET
Abs Immature Granulocytes: 0.14 10*3/uL — ABNORMAL HIGH (ref 0.00–0.07)
Basophils Absolute: 0 10*3/uL (ref 0.0–0.1)
Basophils Relative: 0 %
Eosinophils Absolute: 0 10*3/uL (ref 0.0–0.5)
Eosinophils Relative: 0 %
HCT: 35.8 % — ABNORMAL LOW (ref 39.0–52.0)
Hemoglobin: 11.2 g/dL — ABNORMAL LOW (ref 13.0–17.0)
Immature Granulocytes: 1 %
Lymphocytes Relative: 15 %
Lymphs Abs: 1.6 10*3/uL (ref 0.7–4.0)
MCH: 32.6 pg (ref 26.0–34.0)
MCHC: 31.3 g/dL (ref 30.0–36.0)
MCV: 104.1 fL — ABNORMAL HIGH (ref 80.0–100.0)
Monocytes Absolute: 0.3 10*3/uL (ref 0.1–1.0)
Monocytes Relative: 3 %
Neutro Abs: 8.6 10*3/uL — ABNORMAL HIGH (ref 1.7–7.7)
Neutrophils Relative %: 81 %
Platelets: 164 10*3/uL (ref 150–400)
RBC: 3.44 MIL/uL — ABNORMAL LOW (ref 4.22–5.81)
RDW: 16.9 % — ABNORMAL HIGH (ref 11.5–15.5)
WBC: 10.6 10*3/uL — ABNORMAL HIGH (ref 4.0–10.5)
nRBC: 0 % (ref 0.0–0.2)

## 2018-06-16 LAB — APTT: aPTT: 27 seconds (ref 24–36)

## 2018-06-16 NOTE — ED Provider Notes (Signed)
[facility] EMERGENCY DEPARTMENT Provider Note   CSN: [id number] Arrival date & time: 06/16/18  1255    History   Chief Complaint Chief Complaint  Patient presents with  . Leg Pain    HPI Daniel Morse is a 78 y.o. male.     HPI Patient presents with acute onset right leg pain that started 6 days ago.  States the pain started after cutting his grass on a riding lawnmower.  He denies any known trauma.  States the pain radiates from his foot up to his hip.  It is worse with movement.  States he is also had swelling to the right leg compared to the left.  Patient's currently not on anticoagulation due to recent gastrointestinal bleeding.  He denies any numbness or tingling to the foot.  Denies low back pain. Past Medical History:  Diagnosis Date  . Atrial flutter ([place]) 06/2010  . Blind   . Cerebrovascular disease 2008   Left CEA  . Chest pain    negative stress nuclear in 11/2002  . Chronic anticoagulation   . Dysrhythmia   . GERD (gastroesophageal reflux disease)    h/o esophagitis  . History of colonic polyps    Colonoscopy and polypectomy in 03/2011  . History of kidney stones   . Hyperlipidemia    Metabolic syndrome with increased triglycerides  . Hypertension   . Macular degeneration    legally blind  . Nephrolithiasis   . Peripheral vascular disease ([place])   . Tobacco abuse    50 pack years; 1/2 pack per day    Patient Active Problem List   Diagnosis Date Noted  . DVT (deep venous thrombosis) ([place]) 06/16/2018  . Low hemoglobin 05/18/2018  . Malnutrition of moderate degree 04/15/2018  . Warfarin toxicity 04/14/2018  . Acute kidney failure ([place]) 04/14/2018  . 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 Hepatic steatosis 04/14/2018  . Perirectal abscess   . Family hx of colon cancer 04/19/2016  . History of colonic polyps 04/19/2016  . Encounter for therapeutic drug monitoring 04/14/2013  . Hypokalemia 12/03/2012  . Peripheral vascular disease ([place])  11/05/2012  . Tobacco abuse   . Cerebrovascular disease   . Nephrolithiasis   . Hyperlipidemia   . Hypertension   . GERD (gastroesophageal reflux disease)   . Atrial fibrillation     Past Surgical History:  Procedure Laterality Date  . CAROTID ENDARTERECTOMY  2008   Left  . COLONOSCOPY  04/04/2011   Rogene Houston, MD;  multiple polyps ablated  . COLONOSCOPY N/A 06/12/2016   Procedure: COLONOSCOPY;  Surgeon: Rogene Houston, MD;  Location: [facility] ENDO SUITE;  Service: Endoscopy;  Laterality: N/A;  1030  . CYSTOSCOPY W/ URETERAL STENT PLACEMENT     and stone extraction  . GIVENS CAPSULE STUDY N/A 05/20/2018   Procedure: GIVENS CAPSULE STUDY;  Surgeon: Rogene Houston, MD;  Location: [facility] ENDO SUITE;  Service: Endoscopy;  Laterality: N/A;  7:30        Home Medications    Prior to Admission medications   Medication Sig Start Date End Date Taking? Authorizing Provider  diltiazem (CARDIZEM) 60 MG tablet Take 1 tablet (60 mg total) by mouth 2 (two) times daily. 05/21/18   Arnoldo Lenis, MD  famotidine (PEPCID) 20 MG tablet Take 1 tablet (20 mg total) by mouth 2 (two) times daily. 05/14/18   [name], Mechele Dawley, MD  levothyroxine (SYNTHROID, LEVOTHROID) 50 MCG tablet Take 50 mcg by mouth every morning. 03/19/17  [provider]  Metoprolol Tartrate 75 MG TABS Take 75 mg by mouth 2 (two) times daily for 30 days. 05/19/18 06/18/18  Arnoldo Lenis, MD  predniSONE (DELTASONE) 10 MG tablet Take 10 mg by mouth daily with breakfast.     [provider]  Specialty Vitamins Products (ICAPS LUTEIN-ZEAXANTHIN PO) Take 1 tablet by mouth 2 (two) times daily.     [provider]  warfarin (COUMADIN) 2.5 MG tablet Take 1 tablet daily except 1 1/2 tablets on Tuesdays, Thursdays and Saturdays Patient taking differently: Take 2.5-3.75 mg by mouth See admin instructions. Take 1 tablet daily except 1 1/2 tablets on Tuesdays, Thursdays and Saturdays 02/04/17   Arnoldo Lenis, MD     Family History Family History  Problem Relation Age of Onset  . Heart attack Father   . Hypertension Sister   . Heart attack Brother   . Colon cancer Paternal Uncle   . Heart attack Brother     Social History Social History   Tobacco Use  . Smoking status: Former Smoker    Packs/day: 0.50    Years: 50.00    Pack years: 25.00    Types: Cigarettes    Start date: 11/06/1978    Last attempt to quit: 11/05/2009    Years since quitting: 8.6  . Smokeless tobacco: Former Systems developer    Quit date: 06/01/2010  Substance Use Topics  . Alcohol use: Not Currently    Alcohol/week: 20.0 standard drinks    Types: 20 Shots of liquor per week    Comment: 1/2 gallon of vodka (as much as possible) quit 8 weeks ago  . Drug use: No     Allergies   Statins   Review of Systems Review of Systems  Constitutional: Negative for chills and fever.  Respiratory: Negative for cough and shortness of breath.   Cardiovascular: Positive for leg swelling. Negative for chest pain and palpitations.  Gastrointestinal: Negative for abdominal pain, constipation, diarrhea, nausea and vomiting.  Musculoskeletal: Negative for back pain and neck pain.  Skin: Negative for rash and wound.  Neurological: Negative for dizziness, weakness, light-headedness, numbness and headaches.  All other systems reviewed and are negative.    Physical Exam Updated Vital Signs BP 126/86   Pulse 86   Temp 97.6 F (36.4 C) (Oral)   Resp 16   Ht $R'5\' 6"'fG$  (1.676 m)   Wt 95.3 kg   SpO2 98%   BMI 33.89 kg/m   Physical Exam Vitals signs and nursing note reviewed.  Constitutional:      General: He is not in acute distress.    Appearance: Normal appearance. He is well-developed.  HENT:     Head: Normocephalic and atraumatic.     Nose: Nose normal.     Mouth/Throat:     Mouth: Mucous membranes are moist.  Eyes:     Extraocular Movements: Extraocular movements intact.     Pupils: Pupils are equal, round, and reactive to light.   Neck:     Musculoskeletal: Normal range of motion and neck supple.  Cardiovascular:     Rate and Rhythm: Normal rate and regular rhythm.  Pulmonary:     Effort: Pulmonary effort is normal. No respiratory distress.     Breath sounds: Normal breath sounds. No wheezing or rales.  Abdominal:     General: Bowel sounds are normal. There is no distension.     Palpations: Abdomen is soft. There is no mass.     Tenderness: There is  no abdominal tenderness. There is no right CVA tenderness, left CVA tenderness, guarding or rebound.     Hernia: No hernia is present.  Musculoskeletal: Normal range of motion.        General: Swelling present. No tenderness.     Comments: Patient with 1+ edema to the right lower extremity up to the level of the mid tibia.  The right foot is cool to touch from the midfoot anteriorly.  Dusky toenails noted.  Unable to palpate posterior tibial or dorsalis pedis pulses.  Posterior tibial pulse was obtained with Doppler.  Patient also has scattered petechiae over the right lower extremity.  Full range of motion of all joints without discomfort.  No tenderness to palpation.  No crepitance or deformity.  Skin:    General: Skin is warm and dry.     Findings: No erythema or rash.  Neurological:     General: No focal deficit present.     Mental Status: He is alert and oriented to person, place, and time.     Comments: 5/5 motor in bilateral lower extremities.  Sensation to light touch intact.  Psychiatric:        Behavior: Behavior normal.      ED Treatments / Results  Labs (all labs ordered are listed, but only abnormal results are displayed) Labs Reviewed  CBC WITH DIFFERENTIAL/PLATELET - Abnormal; Notable for the following components:      Result Value   WBC 10.6 (*)    RBC 3.44 (*)    Hemoglobin 11.2 (*)    HCT 35.8 (*)    MCV 104.1 (*)    RDW 16.9 (*)    Neutro Abs 8.6 (*)    Abs Immature Granulocytes 0.14 (*)    All other components within normal limits   BASIC METABOLIC PANEL - Abnormal; Notable for the following components:   Glucose, Bld 108 (*)    BUN 24 (*)    Calcium 8.2 (*)    All other components within normal limits  PROTIME-INR  APTT    EKG EKG Interpretation  Date/Time:  Tuesday June 16 2018 15:05:43 EDT Ventricular Rate:  126 PR Interval:    QRS Duration: 86 QT Interval:  358 QTC Calculation: 519 R Axis:   -16 Text Interpretation:  Atrial fibrillation Borderline left axis deviation Low voltage, extremity and precordial leads Consider anterior infarct Minimal ST depression, lateral leads Prolonged QT interval Confirmed by Dory Horn) on 06/17/2018 9:37:05 AM   Radiology No results found.  Procedures Procedures (including critical care time)  Medications Ordered in ED Medications - No data to display  CRITICAL CARE Performed by: Julianne Rice Total critical care time: 25 minutes Critical care time was exclusive of separately billable procedures and treating other patients. Critical care was necessary to treat or prevent imminent or life-threatening deterioration. Critical care was time spent personally by me on the following activities: development of treatment plan with patient and/or surrogate as well as nursing, discussions with consultants, evaluation of patient's response to treatment, examination of patient, obtaining history from patient or surrogate, ordering and performing treatments and interventions, ordering and review of laboratory studies, ordering and review of radiographic studies, pulse oximetry and re-evaluation of patient's condition. Initial Impression / Assessment and Plan / ED Course  I have reviewed the triage vital signs and the nursing notes.  Pertinent labs & imaging results that were available during my care of the patient were reviewed by me and considered in my medical decision making (see  chart for details).       Discussed with Dr. Scot Dock.  Recommends transfer to the  [facility] emergency department and he will see the patient there.  Advises against heparin.  Discussed with Dr. Sedonia Small who accepts patient.   Final Clinical Impressions(s) / ED Diagnoses   Final diagnoses:  Right leg pain    ED Discharge Orders    None       Julianne Rice, MD 06/19/18 [phone]

## 2018-06-16 NOTE — H&P (Addendum)
REASON FOR ADMISSION:    6 day history of right leg pain.  The consult is requested by the [facility] emergency department.  ASSESSMENT & PLAN:   RIGHT CALF DVT: This patient noted the gradual onset of pain in his right leg 6 days ago after mowing his grass with a riding mower.  His venous duplex scan shows a clot in the right posterior tibial vein.  This does not involve the popliteal, femoral, or common femoral vein.  He is not on Coumadin as he refuses to take this.  I discussed other potential options for anticoagulation and he absolutely refuses any anticoagulation.  He is on aspirin.  Given that this is only a calf DVT I think all things considered the best option is a follow-up duplex in approximately 2 weeks.  We have also discussed the importance of leg elevation.   Although he does have a history of atrial fibrillation he has a palpable posterior tibial pulse on the right and a biphasic posterior tibial and dorsalis pedis signal with the Doppler.  Thus I do not think he has any evidence of embolic event.  I think his symptoms are explained by his right calf DVT.  I will arrange follow-up in my office in 2 weeks with a venous duplex at that time.  He knows to call sooner if he has problems.   Deitra Mayo, MD, [place] [phone] Office: [phone]   HPI:   Daniel Morse is a pleasant 78 y.o. male, with a somewhat complicated medical history.  He has a history of atrial fibrillation but was taken off of Coumadin in January of this year because he had repeated issues with markedly elevated protimes.  Most recently he was admitted from 04/14/18 until 04/26/18 with acute kidney injury, and hepatic steatosis with elevated LFTs.  According to this discharge summary the patient was restarted on Coumadin but tells me that he has not taken Coumadin since January 2020.  Patient does have a history of alcohol abuse and apparently was a heavy alcohol user until approximately 2 months  prior to this last admission.  The patient tells me that 6 days ago he cut his grass using a riding mower.  After that he noted the gradual onset of right leg pain from his toes all the way up to his hip.  The pain is relieved by lying supine.  Pain is aggravated by minimal activity and walking.  The entire right leg hurts when he ambulates.  Prior to this event he denied any history of claudication, rest pain, or nonhealing ulcers.  It does not sound like the event 6 days ago occurred acutely he noticed it somewhat gradually after cutting his grass.  However his symptoms have persisted and gradually progressed which is why he presented to the [facility] emergency department.  He did have a good Doppler signal in the posterior tibial position on the right but given his history he was sent for vascular consultation.  The patient also notes that his right leg is been swollen for the last several days.  He denies any previous history of DVT or phlebitis.  Past Medical History:  Diagnosis Date   Atrial flutter ([place]) 06/2010   Blind    Cerebrovascular disease 2008   Left CEA   Chest pain    negative stress nuclear in 11/2002   Chronic anticoagulation    Dysrhythmia    GERD (gastroesophageal reflux disease)    h/o esophagitis   History  of colonic polyps    Colonoscopy and polypectomy in 03/2011   History of kidney stones    Hyperlipidemia    Metabolic syndrome with increased triglycerides   Hypertension    Macular degeneration    legally blind   Nephrolithiasis    Peripheral vascular disease ([facility])    Tobacco abuse    50 pack years; 1/2 pack per day    Family History  Problem Relation Age of Onset   Heart attack Father    Hypertension Sister    Heart attack Brother    Colon cancer Paternal Uncle    Heart attack Brother     SOCIAL HISTORY: Social History   Socioeconomic History   Marital status: Married    Spouse name: Not on file   Number of children: Not  on file   Years of education: Not on file   Highest education level: Not on file  Occupational History   Not on file  Social Needs   Financial resource strain: Not on file   Food insecurity:    Worry: Not on file    Inability: Not on file   Transportation needs:    Medical: Not on file    Non-medical: Not on file  Tobacco Use   Smoking status: Former Smoker    Packs/day: 0.50    Years: 50.00    Pack years: 25.00    Types: Cigarettes    Start date: 11/06/1978    Last attempt to quit: 11/05/2009    Years since quitting: 8.6   Smokeless tobacco: Former Systems developer    Quit date: 06/01/2010  Substance and Sexual Activity   Alcohol use: Not Currently    Alcohol/week: 20.0 standard drinks    Types: 20 Shots of liquor per week    Comment: 1/2 gallon of vodka (as much as possible) quit 8 weeks ago   Drug use: No   Sexual activity: Not on file  Lifestyle   Physical activity:    Days per week: Not on file    Minutes per session: Not on file   Stress: Not on file  Relationships   Social connections:    Talks on phone: Not on file    Gets together: Not on file    Attends religious service: Not on file    Active member of club or organization: Not on file    Attends meetings of clubs or organizations: Not on file    Relationship status: Not on file   Intimate partner violence:    Fear of current or ex partner: Not on file    Emotionally abused: Not on file    Physically abused: Not on file    Forced sexual activity: Not on file  Other Topics Concern   Not on file  Social History Narrative   Not on file    Allergies  Allergen Reactions   Statins Swelling    Legs hurt    No current facility-administered medications for this encounter.    Current Outpatient Medications  Medication Sig Dispense Refill   diltiazem (CARDIZEM) 60 MG tablet Take 1 tablet (60 mg total) by mouth 2 (two) times daily. 180 tablet 3   famotidine (PEPCID) 20 MG tablet Take 1 tablet (20 mg  total) by mouth 2 (two) times daily. 60 tablet 5   levothyroxine (SYNTHROID, LEVOTHROID) 50 MCG tablet Take 50 mcg by mouth every morning.     Metoprolol Tartrate 75 MG TABS Take 75 mg by mouth 2 (two) times daily  for 30 days. 60 tablet 0   predniSONE (DELTASONE) 10 MG tablet Take 10 mg by mouth daily with breakfast.      Specialty Vitamins Products (ICAPS LUTEIN-ZEAXANTHIN PO) Take 1 tablet by mouth 2 (two) times daily.      warfarin (COUMADIN) 2.5 MG tablet Take 1 tablet daily except 1 1/2 tablets on Tuesdays, Thursdays and Saturdays (Patient taking differently: Take 2.5-3.75 mg by mouth See admin instructions. Take 1 tablet daily except 1 1/2 tablets on Tuesdays, Thursdays and Saturdays) 114 tablet 4    REVIEW OF SYSTEMS:  $RemoveB'[X]'iomxNGxl$  denotes positive finding, $RemoveBeforeDEI'[ ]'WkdhvwsyCWKToVEK$  denotes negative finding Cardiac  Comments:  Chest pain or chest pressure:    Shortness of breath upon exertion:    Short of breath when lying flat:    Irregular heart rhythm: x  atrial fibrillation      Vascular    Pain in calf, thigh, or hip brought on by ambulation: x   Pain in feet at night that wakes you up from your sleep:     Blood clot in your veins:    Leg swelling:  x  right leg      Pulmonary    Oxygen at home:    Productive cough:     Wheezing:         Neurologic    Sudden weakness in arms or legs:     Sudden numbness in arms or legs:     Sudden onset of difficulty speaking or slurred speech:    Temporary loss of vision in one eye:     Problems with dizziness:         Gastrointestinal    Blood in stool:     Vomited blood:         Genitourinary    Burning when urinating:     Blood in urine:        Psychiatric    Major depression:         Hematologic    Bleeding problems:    Problems with blood clotting too easily:        Skin    Rashes or ulcers:        Constitutional    Fever or chills:     PHYSICAL EXAM:   Vitals:   06/16/18 1507 06/16/18 1513 06/16/18 1524 06/16/18 1602  BP:   (!)  133/94 139/89  Pulse: (!) 132 (!) 117 (!) 114 79  Resp: (!) 21 (!) $Remo'26 20 14  'fWgVf$ Temp:   97.9 F (36.6 C) 97.6 F (36.4 C)  TempSrc:   Oral Oral  SpO2: 99% 98% 95% 98%  Weight:      Height:       GENERAL: The patient is a well-nourished male, in no acute distress. The vital signs are documented above. CARDIAC: There is a regular rate and rhythm.  VASCULAR: I do not detect carotid bruits. On the right side, which is the symptomatic side, he has a slightly diminished femoral pulse.  He also has a diminished but palpable posterior tibial pulse on my exam.  He has a biphasic posterior tibial and dorsalis pedis signal with the Doppler on the right.  Motor and sensory function is intact. On the left side he has a diminished but palpable femoral pulse.  He has a biphasic dorsalis pedis and posterior tibial signal. Both feet are slightly cool and there is not appear to be a big discrepancy in temperature between the 2 feet. He has good capillary refill  bilaterally and both feet appear pink and well-perfused. PULMONARY: There is good air exchange bilaterally without wheezing or rales. ABDOMEN: Soft and non-tender with normal pitched bowel sounds.  I do not palpate an abdominal aortic aneurysm. MUSCULOSKELETAL: There are no major deformities or cyanosis. NEUROLOGIC: No focal weakness or paresthesias are detected. SKIN: There are no ulcers or rashes noted. PSYCHIATRIC: The patient has a normal affect.  DATA:    LABS:   His creatinine is 0.97.  GFR is greater than 60.  Hemoglobin 11.2 hematocrit 35.8.  Platelets 164,000.  INR is 1.0.  PTT is 27.  VENOUS DUPLEX RIGHT LEG: I have independently interpreted the duplex of the right lower extremity.  There is a DVT in the right posterior tibial vein only.

## 2018-06-16 NOTE — Progress Notes (Signed)
RLE venous duplex       has been completed. Preliminary results can be found under CV proc through chart review. June Leap, BS, RDMS, RVT  Positive results discussed with [facility]

## 2018-06-16 NOTE — ED Notes (Signed)
Vascular Tech at bedside.

## 2018-06-16 NOTE — ED Notes (Signed)
Vascular MD at bedside.

## 2018-06-16 NOTE — Discharge Instructions (Addendum)
Get help right away if: You have: New or increased pain, swelling, or redness in an arm or leg. Numbness or tingling in an arm or leg. Shortness of breath. Chest pain. A rapid or irregular heartbeat. A severe headache or confusion. A cut that will not stop bleeding. There is blood in your vomit, stool, or urine. You have a serious fall or accident, or you hit your head. You feel light-headed or dizzy. You cough up blood.

## 2018-06-16 NOTE — ED Provider Notes (Signed)
Patient transferred from [facility] for concern of arterial emboli. Found to have an avcute DVT here in the ER. Patient seen by Dr. Scot Dock of vascular surgery who has consulted on the patient, initially planned to admit, but now states he is safe for discharge. He will follow closely with Dr. Scot Dock.   Margarita Mail, PA-C 06/16/18 1739    Charlesetta Shanks, MD 06/17/18 [phone]

## 2018-06-16 NOTE — ED Triage Notes (Signed)
Patient complaining of right leg pain x 1 week after cutting grass on riding lawn mower. Denies injury.

## 2018-06-18 ENCOUNTER — Other Ambulatory Visit: Payer: Self-pay

## 2018-06-18 DIAGNOSIS — I824Y9 Acute embolism and thrombosis of unspecified deep veins of unspecified proximal lower extremity: Secondary | ICD-10-CM

## 2018-06-18 DIAGNOSIS — I739 Peripheral vascular disease, unspecified: Secondary | ICD-10-CM

## 2018-06-18 NOTE — Addendum Note (Signed)
Addended by: York Cerise C on: 06/18/2018 12:58 PM   Modules accepted: Orders

## 2018-06-18 NOTE — Addendum Note (Signed)
Addended by: York Cerise C on: 06/18/2018 12:57 PM   Modules accepted: Orders

## 2018-06-18 NOTE — Addendum Note (Signed)
Addended by: York Cerise C on: 06/18/2018 01:04 PM   Modules accepted: Orders

## 2018-06-19 ENCOUNTER — Telehealth: Payer: Self-pay | Admitting: Vascular Surgery

## 2018-06-19 NOTE — Telephone Encounter (Signed)
sch appt lvm mld ltr 07/01/2018 8am LE DVT 41am f/u MD

## 2018-06-19 NOTE — Telephone Encounter (Signed)
-----   Message from Mena Goes, RN sent at 06/17/2018  7:56 AM EDT ----- Regarding: appt in 2 weeks with CSD and venous duplex  ----- Message ----- From: Angelia Mould, MD Sent: 06/16/2018   5:39 PM EDT To: Vvs Charge Pool Subject: charge and f/u                                 Level 4 consult.  He has a right calf DVT.  He needs a follow-up visit in 2 weeks with a venous duplex scan at that time to be sure there is no propagation.  CD

## 2018-06-22 ENCOUNTER — Other Ambulatory Visit: Payer: Self-pay | Admitting: Cardiology

## 2018-06-24 ENCOUNTER — Other Ambulatory Visit (INDEPENDENT_AMBULATORY_CARE_PROVIDER_SITE_OTHER): Payer: Self-pay | Admitting: *Deleted

## 2018-06-24 ENCOUNTER — Encounter (INDEPENDENT_AMBULATORY_CARE_PROVIDER_SITE_OTHER): Payer: Self-pay | Admitting: *Deleted

## 2018-06-24 DIAGNOSIS — D649 Anemia, unspecified: Secondary | ICD-10-CM

## 2018-06-29 ENCOUNTER — Other Ambulatory Visit (INDEPENDENT_AMBULATORY_CARE_PROVIDER_SITE_OTHER): Payer: Self-pay | Admitting: *Deleted

## 2018-06-29 DIAGNOSIS — D649 Anemia, unspecified: Secondary | ICD-10-CM

## 2018-07-01 ENCOUNTER — Ambulatory Visit (HOSPITAL_COMMUNITY)
Admission: RE | Admit: 2018-07-01 | Discharge: 2018-07-01 | Disposition: A | Discharge status: Home / Self Care | Payer: Medicare HMO | Source: Ambulatory Visit | Attending: Vascular Surgery | Admitting: Vascular Surgery

## 2018-07-01 ENCOUNTER — Other Ambulatory Visit: Payer: Self-pay

## 2018-07-01 ENCOUNTER — Ambulatory Visit: Payer: Medicare HMO | Admitting: Vascular Surgery

## 2018-07-01 ENCOUNTER — Encounter: Payer: Self-pay | Admitting: Vascular Surgery

## 2018-07-01 VITALS — BP 122/71 | HR 95 | Temp 97.4°F | Resp 20 | Ht 66.0 in | Wt 203.0 lb

## 2018-07-01 DIAGNOSIS — I824Y9 Acute embolism and thrombosis of unspecified deep veins of unspecified proximal lower extremity: Secondary | ICD-10-CM

## 2018-07-01 NOTE — Progress Notes (Signed)
Patient name: Daniel Morse MRN: [id number] DOB: [birth year] Sex: male  REASON FOR VISIT:   Follow-up of right calf DVT  HPI:   Daniel Morse is a pleasant 78 y.o. male Seen in consultation on 06/16/2018.  The patient had presented with right leg pain for 6 days.  Venous duplex scan showed a clot in the right posterior tibial vein.  Patient was not on Coumadin and refused to take this.  I discussed other potential options for anticoagulation and he absolutely refused any anticoagulation.  He is on aspirin.  Therefore I recommended a follow-up visit in 2 weeks with a duplex to rule out any propagation of the clot.  Of note at that time he had a history of atrial fibrillation and has a palpable posterior tibial pulse on the right with a biphasic posterior tibial and dorsalis pedis signal.  I felt that his right calf DVT explained his right calf pain.  Since I saw him last he denies any calf pain.  He has been working in the yard without any problems.  He has had some mild leg swelling on the right which has improved.  He does elevate his leg in his recliner.  Current Outpatient Medications  Medication Sig Dispense Refill  . aspirin EC 81 MG tablet Take 81 mg by mouth daily.    Marland Kitchen diltiazem (CARDIZEM) 60 MG tablet Take 1 tablet (60 mg total) by mouth 2 (two) times daily. 180 tablet 3  . famotidine (PEPCID) 20 MG tablet Take 1 tablet (20 mg total) by mouth 2 (two) times daily. 60 tablet 5  . levothyroxine (SYNTHROID, LEVOTHROID) 50 MCG tablet Take 50 mcg by mouth every morning.    . Metoprolol Tartrate 75 MG TABS Take 1 tablet by mouth twice daily 60 tablet 6  . predniSONE (DELTASONE) 10 MG tablet Take 10 mg by mouth daily with breakfast.     . Specialty Vitamins Products (ICAPS LUTEIN-ZEAXANTHIN PO) Take 1 tablet by mouth 2 (two) times daily.     Marland Kitchen warfarin (COUMADIN) 2.5 MG tablet Take 1 tablet daily except 1 1/2 tablets on Tuesdays, Thursdays and Saturdays (Patient not taking: Reported  on 07/01/2018) 114 tablet 4   No current facility-administered medications for this visit.     REVIEW OF SYSTEMS:  [X]  denotes positive finding, [ ]  denotes negative finding Vascular    Leg swelling    Cardiac    Chest pain or chest pressure:    Shortness of breath upon exertion:    Short of breath when lying flat:    Irregular heart rhythm:    Constitutional    Fever or chills:     PHYSICAL EXAM:   Vitals:   07/01/18 0842  BP: 122/71  Pulse: 95  Resp: 20  Temp: (!) 97.4 F (36.3 C)  SpO2: 96%  Weight: 203 lb (92.1 kg)  Height: 5\' 6"  (1.676 m)    GENERAL: The patient is a well-nourished male, in no acute distress. The vital signs are documented above. CARDIOVASCULAR: There is a regular rate and rhythm. PULMONARY: There is good air exchange bilaterally without wheezing or rales. He has biphasic anterior tibial and posterior tibial signal on the right and a biphasic dorsalis pedis and posterior tibial signal on the left.  There is moderate swelling of the right ankle. Both feet are warm and well-perfused.  DATA:   VENOUS DUPLEX: I have independently interpreted his venous duplex scan.  This shows a right calf DVT involving the posterior  tibial vein of indeterminate age.  There has been no propagation of the clot.  MEDICAL ISSUES:   RIGHT CALF DVT: The patient has had no propagation of his clot.  He is on aspirin.  He refused anticoagulation but at this point I think he does not need it regardless.  I have encouraged him to stay as active as possible.  We discussed the importance of leg elevation.  I will see him back as needed.  Deitra Mayo [facility] [phone]

## 2018-07-06 DIAGNOSIS — D649 Anemia, unspecified: Secondary | ICD-10-CM | POA: Diagnosis not present

## 2018-07-06 DIAGNOSIS — N184 Chronic kidney disease, stage 4 (severe): Secondary | ICD-10-CM | POA: Diagnosis not present

## 2018-07-07 LAB — COMP. METABOLIC PANEL (12)
AST: 38 IU/L (ref 0–40)
Albumin/Globulin Ratio: 1.3 (ref 1.2–2.2)
Albumin: 3.4 g/dL — ABNORMAL LOW (ref 3.7–4.7)
Alkaline Phosphatase: 114 IU/L (ref 39–117)
BUN/Creatinine Ratio: 21 (ref 10–24)
BUN: 22 mg/dL (ref 8–27)
Bilirubin Total: 0.5 mg/dL (ref 0.0–1.2)
Calcium: 8.9 mg/dL (ref 8.6–10.2)
Chloride: 108 mmol/L — ABNORMAL HIGH (ref 96–106)
Creatinine, Ser: 1.05 mg/dL (ref 0.76–1.27)
GFR calc Af Amer: 79 mL/min/{1.73_m2} (ref >59)
GFR calc non Af Amer: 69 mL/min/{1.73_m2} (ref >59)
Globulin, Total: 2.6 g/dL (ref 1.5–4.5)
Glucose: 110 mg/dL — ABNORMAL HIGH (ref 65–99)
Potassium: 4.2 mmol/L (ref 3.5–5.2)
Sodium: 144 mmol/L (ref 134–144)
Total Protein: 6 g/dL (ref 6.0–8.5)

## 2018-07-07 LAB — CBC
Hematocrit: 37.3 % — ABNORMAL LOW (ref 37.5–51.0)
Hemoglobin: 12.6 g/dL — ABNORMAL LOW (ref 13.0–17.7)
MCH: 32.6 pg (ref 26.6–33.0)
MCHC: 33.8 g/dL (ref 31.5–35.7)
MCV: 97 fL (ref 79–97)
Platelets: 229 10*3/uL (ref 150–450)
RBC: 3.86 x10E6/uL — ABNORMAL LOW (ref 4.14–5.80)
RDW: 13.6 % (ref 11.6–15.4)
WBC: 10 10*3/uL (ref 3.4–10.8)

## 2018-07-13 ENCOUNTER — Other Ambulatory Visit (HOSPITAL_COMMUNITY): Payer: Self-pay | Admitting: Family Medicine

## 2018-07-13 ENCOUNTER — Other Ambulatory Visit: Payer: Self-pay | Admitting: Family Medicine

## 2018-07-13 DIAGNOSIS — Z6831 Body mass index (BMI) 31.0-31.9, adult: Secondary | ICD-10-CM | POA: Diagnosis not present

## 2018-07-13 DIAGNOSIS — E785 Hyperlipidemia, unspecified: Secondary | ICD-10-CM | POA: Diagnosis not present

## 2018-07-13 DIAGNOSIS — R7309 Other abnormal glucose: Secondary | ICD-10-CM | POA: Diagnosis not present

## 2018-07-13 DIAGNOSIS — I739 Peripheral vascular disease, unspecified: Secondary | ICD-10-CM

## 2018-07-13 DIAGNOSIS — E7849 Other hyperlipidemia: Secondary | ICD-10-CM | POA: Diagnosis not present

## 2018-07-16 ENCOUNTER — Ambulatory Visit: Payer: Self-pay | Admitting: *Deleted

## 2018-07-16 ENCOUNTER — Other Ambulatory Visit: Payer: Self-pay | Admitting: *Deleted

## 2018-07-17 ENCOUNTER — Other Ambulatory Visit: Payer: Self-pay

## 2018-07-17 ENCOUNTER — Ambulatory Visit (HOSPITAL_COMMUNITY)
Admission: RE | Admit: 2018-07-17 | Discharge: 2018-07-17 | Disposition: A | Discharge status: Home / Self Care | Payer: Medicare HMO | Source: Ambulatory Visit | Attending: Family Medicine | Admitting: Family Medicine

## 2018-07-17 DIAGNOSIS — I739 Peripheral vascular disease, unspecified: Secondary | ICD-10-CM | POA: Insufficient documentation

## 2018-07-30 DIAGNOSIS — N184 Chronic kidney disease, stage 4 (severe): Secondary | ICD-10-CM | POA: Diagnosis not present

## 2018-08-05 ENCOUNTER — Telehealth: Payer: Self-pay | Admitting: Cardiology

## 2018-08-05 ENCOUNTER — Ambulatory Visit (INDEPENDENT_AMBULATORY_CARE_PROVIDER_SITE_OTHER): Payer: Medicare HMO | Admitting: Internal Medicine

## 2018-08-05 DIAGNOSIS — D649 Anemia, unspecified: Secondary | ICD-10-CM | POA: Diagnosis not present

## 2018-08-05 DIAGNOSIS — I739 Peripheral vascular disease, unspecified: Secondary | ICD-10-CM | POA: Diagnosis not present

## 2018-08-05 DIAGNOSIS — I776 Arteritis, unspecified: Secondary | ICD-10-CM | POA: Diagnosis not present

## 2018-08-05 DIAGNOSIS — I82552 Chronic embolism and thrombosis of left peroneal vein: Secondary | ICD-10-CM | POA: Diagnosis not present

## 2018-08-05 DIAGNOSIS — N179 Acute kidney failure, unspecified: Secondary | ICD-10-CM | POA: Diagnosis not present

## 2018-08-05 DIAGNOSIS — R69 Illness, unspecified: Secondary | ICD-10-CM | POA: Diagnosis not present

## 2018-08-05 DIAGNOSIS — K7210 Chronic hepatic failure without coma: Secondary | ICD-10-CM | POA: Diagnosis not present

## 2018-08-05 DIAGNOSIS — K760 Fatty (change of) liver, not elsewhere classified: Secondary | ICD-10-CM | POA: Diagnosis not present

## 2018-08-05 NOTE — Telephone Encounter (Signed)

## 2018-08-07 ENCOUNTER — Telehealth (INDEPENDENT_AMBULATORY_CARE_PROVIDER_SITE_OTHER): Payer: Medicare HMO | Admitting: Cardiology

## 2018-08-07 ENCOUNTER — Encounter: Payer: Self-pay | Admitting: Cardiology

## 2018-08-07 VITALS — BP 105/85 | HR 42 | Ht 72.0 in | Wt 192.0 lb

## 2018-08-07 DIAGNOSIS — I4891 Unspecified atrial fibrillation: Secondary | ICD-10-CM

## 2018-08-07 DIAGNOSIS — R001 Bradycardia, unspecified: Secondary | ICD-10-CM

## 2018-08-07 DIAGNOSIS — R5383 Other fatigue: Secondary | ICD-10-CM

## 2018-08-07 DIAGNOSIS — I10 Essential (primary) hypertension: Secondary | ICD-10-CM

## 2018-08-07 NOTE — Addendum Note (Signed)
Addended by: Debbora Lacrosse R on: 08/07/2018 10:39 AM   Modules accepted: Orders

## 2018-08-07 NOTE — Progress Notes (Signed)
Medication Instructions:  Your physician recommends that you continue on your current medications as directed. Please refer to the Current Medication list given to you today.   Labwork: NONE  Testing/Procedures: Your physician has recommended that you wear an event monitor. Event monitors are medical devices that record the heart's electrical activity. Doctors most often [place] these monitors to diagnose arrhythmias. Arrhythmias are problems with the speed or rhythm of the heartbeat. The monitor is a small, portable device. You can wear one while you do your normal daily activities. This is usually used to diagnose what is causing palpitations/syncope (passing out).    Follow-Up: Your physician recommends that you schedule a follow-up appointment in: 3 MONTHS    Any Other Special Instructions Will Be Listed Below (If Applicable).     If you need a refill on your cardiac medications before your next appointment, please call your pharmacy.

## 2018-08-07 NOTE — Progress Notes (Signed)
Virtual Visit via Telephone Note   This visit type was conducted due to national recommendations for restrictions regarding the COVID-19 Pandemic (e.g. social distancing) in an effort to limit this patient's exposure and mitigate transmission in our community.  Due to his co-morbid illnesses, this patient is at least at moderate risk for complications without adequate follow up.  This format is felt to be most appropriate for this patient at this time.  The patient did not have access to video technology/had technical difficulties with video requiring transitioning to audio format only (telephone).  All issues noted in this document were discussed and addressed.  No physical exam could be performed with this format.  Please refer to the patient's chart for his  consent to telehealth for [facility].   Date:  08/07/2018   ID:  Daniel Morse, DOB [birth year], MRN [id number]  Patient Location: Home Provider Location: Home  PCP:  Sharilyn Sites, MD  Cardiologist:  Dr Carlyle Dolly MD Electrophysiologist:  None   Evaluation Performed:  Follow-Up Visit  Chief Complaint:  Afib  History of Present Illness:    Daniel Morse is a 78 y.o. male seen today for follow up of the following medical problems.   1. Afib - no recent palpitaitons. Documented heart rates labile from ER visits, outpatient visits, and his home reported numbers varying from low 40s to 120s - he has stopped coumadin since recent GI bleed, refuses to restart    2. Fatigue - ongoing 1 year, feeling of being drained with activities. No specific SOB/DOE  3. HTN - compliant with meds   4. Carotid stenosis - left CEA in 05/2006 - carotid US Jan 2017 mild bilateral disease - last test [facility] last year per his report by pcp   5. Hyperlipidemia - intolerant to statins previously - we tried pravastatin $RemoveBeforeDEI'10mg'uuMmmUcuXqjdJwzh$ , unclear what happened as he no longer is taking.   6. AAA screen - normal CT scan  2015  7. Right leg DVT - diagnosed 06/2018, followed by vascular - patient refused anticoagulation  8. Melena - followed by GI, Hgb dropped from 8.5 to 5 - has been off coumadin  - no recent bleeding, has resolved    The patient does not have symptoms concerning for COVID-19 infection (fever, chills, cough, or new shortness of breath).    Past Medical History:  Diagnosis Date  . Atrial flutter ([place]) 06/2010  . Blind   . Cerebrovascular disease 2008   Left CEA  . Chest pain    negative stress nuclear in 11/2002  . Chronic anticoagulation   . Dysrhythmia   . GERD (gastroesophageal reflux disease)    h/o esophagitis  . History of colonic polyps    Colonoscopy and polypectomy in 03/2011  . History of kidney stones   . Hyperlipidemia    Metabolic syndrome with increased triglycerides  . Hypertension   . Macular degeneration    legally blind  . Nephrolithiasis   . Peripheral vascular disease ([place])   . Tobacco abuse    50 pack years; 1/2 pack per day   Past Surgical History:  Procedure Laterality Date  . CAROTID ENDARTERECTOMY  2008   Left  . COLONOSCOPY  04/04/2011   Rogene Houston, MD;  multiple polyps ablated  . COLONOSCOPY N/A 06/12/2016   Procedure: COLONOSCOPY;  Surgeon: Rogene Houston, MD;  [facility];  Service: Endoscopy;  Laterality: N/A;  1030  . [place]  and stone extraction  . GIVENS CAPSULE STUDY N/A 05/20/2018   Procedure: GIVENS CAPSULE STUDY;  Surgeon: Rogene Houston, MD;  Location: [facility];  Service: Endoscopy;  Laterality: N/A;  7:30     No outpatient medications have been marked as taking for the 08/07/18 encounter (Appointment) with Arnoldo Lenis, MD.     Allergies:   Statins   Social History   Tobacco Use  . Smoking status: Former Smoker    Packs/day: 0.50    Years: 50.00    Pack years: 25.00    Types: Cigarettes    Start date: 11/06/1978    Last attempt to quit: 11/05/2009    Years  since quitting: 8.7  . Smokeless tobacco: Former Systems developer    Quit date: 06/01/2010  Substance Use Topics  . Alcohol use: Not Currently    Alcohol/week: 20.0 standard drinks    Types: 20 Shots of liquor per week    Comment: 1/2 gallon of vodka (as much as possible) quit 8 weeks ago  . Drug use: No     Family Hx: The patient's family history includes Colon cancer in his paternal uncle; Heart attack in his brother, brother, and father; Hypertension in his sister.  ROS:   Please see the history of present illness.     All other systems reviewed and are negative.   Prior CV studies:   The following studies were reviewed today:  08/2010 Echo Study Conclusions  - Left ventricle: The cavity size was normal. Mild LVH with  disproportionate thickening of the septum. Systolic function was  mildly to moderately reduced. The estimated ejection fraction was  45%. Mild global hypokinesis; moderate hypokinesis of the mid and  distal septum. - Left atrium: The atrium was mildly to moderately dilated. - Right atrium: The atrium was mildly to moderately dilated. - Atrial septum: No defect or patent foramen ovale was identified. - Pulmonary arteries: PA peak pressure: 58mm Hg (S). Transthoracic echocardiography. M-mode, complete 2D, spectral   04/2013 Carotid US IMPRESSION:  1. No evidence of residual or recurrent stenosis post left carotid  endarterectomy.  2. Mild right carotid bifurcation plaque resulting in less than 50%  diameter stenosis. The exam does not exclude plaque ulceration or  embolization. Continued surveillance recommended.  Jan 2017 Carotid US IMPRESSION: 1. Mild right common carotid and carotid bifurcation atherosclerotic vascular plaque. No flow limiting stenosis. Degree of stenosis less than 50%. 2. Mild left common carotid artery plaque. Left carotid bifurcation widely patent. Prior left carotid endarterectomy. No flow limiting stenosis. Degree of  stenosis less than 50%. 3. Vertebral arteries are patent antegrade flow .  Labs/Other Tests and Data Reviewed:    EKG:  No ECG reviewed.  Recent Labs: 04/06/2018: TSH 1.47 04/23/2018: Magnesium 2.1 05/11/2018: ALT 69 07/06/2018: BUN 22; Creatinine, Ser 1.05; Hemoglobin 12.6; Platelets 229; Potassium 4.2; Sodium 144   Recent Lipid Panel Lab Results  Component Value Date/Time   CHOL 277 (H) 11/30/2012 08:00 AM   TRIG 319 (H) 11/30/2012 08:00 AM   HDL 34 (L) 11/30/2012 08:00 AM   CHOLHDL 8.1 11/30/2012 08:00 AM   LDLCALC 179 (H) 11/30/2012 08:00 AM    Wt Readings from Last 3 Encounters:  07/01/18 203 lb (92.1 kg)  06/16/18 210 lb (95.3 kg)  05/20/18 202 lb (91.6 kg)     Objective:    Vital Signs:   Today's Vitals   08/07/18 0926  BP: 105/85  Pulse: (!) 42  Weight: 192 lb (87.1 kg)  Height: 6' (1.829 m)   Body mass index is 26.04 kg/m.  Normal affect. Normal speech pattern and tone. Comfortable, no apparent distress. No audible signs of SOB or wheezing.   ASSESSMENT & PLAN:    1. Afib - labile heart rates from available data, ranging from 40s to 120s. Unclear if playing a role in his generalized fatigue - obtain 48 hr holter monitor - off coumadin due to recent GI bleed, he continues to refuse to restart any form of anticoag. [facility] at [facility] show normal Hgb  2. HTN - at goal, continue current meds     COVID-19 Education: The signs and symptoms of COVID-19 were discussed with the patient and how to seek care for testing (follow up with PCP or arrange E-visit).  The importance of social distancing was discussed today.  Time:   Today, I have spent 15 minutes with the patient with telehealth technology discussing the above problems.     Medication Adjustments/Labs and Tests Ordered: Current medicines are reviewed at length with the patient today.  Concerns regarding medicines are outlined above.   Tests Ordered: No orders of the defined types were placed  in this encounter.   Medication Changes: No orders of the defined types were placed in this encounter.   Disposition:  Follow up 3 months  Signed, Carlyle Dolly, MD  08/07/2018 8:39 AM    [facility]

## 2018-08-12 ENCOUNTER — Telehealth: Payer: Self-pay | Admitting: Cardiology

## 2018-08-12 DIAGNOSIS — K760 Fatty (change of) liver, not elsewhere classified: Secondary | ICD-10-CM | POA: Diagnosis not present

## 2018-08-12 DIAGNOSIS — R69 Illness, unspecified: Secondary | ICD-10-CM | POA: Diagnosis not present

## 2018-08-12 DIAGNOSIS — T45511D Poisoning by anticoagulants, accidental (unintentional), subsequent encounter: Secondary | ICD-10-CM | POA: Diagnosis not present

## 2018-08-12 DIAGNOSIS — I776 Arteritis, unspecified: Secondary | ICD-10-CM | POA: Diagnosis not present

## 2018-08-12 NOTE — Telephone Encounter (Signed)
Patient called stating that he has not received his heart monitor.

## 2018-08-12 NOTE — Telephone Encounter (Signed)
Pt notified that monitor has been shipped and will arrive 6/11.

## 2018-08-13 ENCOUNTER — Telehealth: Payer: Self-pay | Admitting: Cardiology

## 2018-08-13 NOTE — Telephone Encounter (Signed)
Returned pt call no answer, unable to leave voicemail

## 2018-08-13 NOTE — Telephone Encounter (Signed)
Patient states that he received heart monitor today. States that strips are missing.

## 2018-08-14 NOTE — Telephone Encounter (Signed)
Spoke with pt who states that the strips were not needed after talking to the company.

## 2018-09-01 DIAGNOSIS — D649 Anemia, unspecified: Secondary | ICD-10-CM | POA: Diagnosis not present

## 2018-09-01 DIAGNOSIS — E039 Hypothyroidism, unspecified: Secondary | ICD-10-CM | POA: Diagnosis not present

## 2018-09-01 DIAGNOSIS — H3530 Unspecified macular degeneration: Secondary | ICD-10-CM | POA: Diagnosis not present

## 2018-09-01 DIAGNOSIS — K760 Fatty (change of) liver, not elsewhere classified: Secondary | ICD-10-CM | POA: Diagnosis not present

## 2018-09-01 DIAGNOSIS — R69 Illness, unspecified: Secondary | ICD-10-CM | POA: Diagnosis not present

## 2018-09-01 DIAGNOSIS — D539 Nutritional anemia, unspecified: Secondary | ICD-10-CM | POA: Diagnosis not present

## 2018-09-01 DIAGNOSIS — K7290 Hepatic failure, unspecified without coma: Secondary | ICD-10-CM | POA: Diagnosis not present

## 2018-09-01 DIAGNOSIS — I82552 Chronic embolism and thrombosis of left peroneal vein: Secondary | ICD-10-CM | POA: Diagnosis not present

## 2018-09-01 DIAGNOSIS — I4892 Unspecified atrial flutter: Secondary | ICD-10-CM | POA: Diagnosis not present

## 2018-09-01 DIAGNOSIS — N179 Acute kidney failure, unspecified: Secondary | ICD-10-CM | POA: Diagnosis not present

## 2018-09-01 DIAGNOSIS — I679 Cerebrovascular disease, unspecified: Secondary | ICD-10-CM | POA: Diagnosis not present

## 2018-09-01 DIAGNOSIS — I776 Arteritis, unspecified: Secondary | ICD-10-CM | POA: Diagnosis not present

## 2018-09-01 DIAGNOSIS — Z452 Encounter for adjustment and management of vascular access device: Secondary | ICD-10-CM | POA: Diagnosis not present

## 2018-09-01 DIAGNOSIS — I739 Peripheral vascular disease, unspecified: Secondary | ICD-10-CM | POA: Diagnosis not present

## 2018-09-01 DIAGNOSIS — I10 Essential (primary) hypertension: Secondary | ICD-10-CM | POA: Diagnosis not present

## 2018-09-10 ENCOUNTER — Emergency Department (HOSPITAL_COMMUNITY): Payer: Medicare HMO

## 2018-09-10 ENCOUNTER — Emergency Department (HOSPITAL_COMMUNITY)
Admission: EM | Admit: 2018-09-10 | Discharge: 2018-09-10 | Disposition: A | Discharge status: Home / Self Care | Payer: Medicare HMO | Attending: Emergency Medicine | Admitting: Emergency Medicine

## 2018-09-10 ENCOUNTER — Other Ambulatory Visit: Payer: Self-pay

## 2018-09-10 ENCOUNTER — Encounter (HOSPITAL_COMMUNITY): Payer: Self-pay | Admitting: Emergency Medicine

## 2018-09-10 DIAGNOSIS — R600 Localized edema: Secondary | ICD-10-CM | POA: Diagnosis not present

## 2018-09-10 DIAGNOSIS — I4891 Unspecified atrial fibrillation: Secondary | ICD-10-CM | POA: Diagnosis not present

## 2018-09-10 DIAGNOSIS — Z5321 Procedure and treatment not carried out due to patient leaving prior to being seen by health care provider: Secondary | ICD-10-CM | POA: Diagnosis not present

## 2018-09-10 DIAGNOSIS — M2540 Effusion, unspecified joint: Secondary | ICD-10-CM

## 2018-09-10 DIAGNOSIS — R609 Edema, unspecified: Secondary | ICD-10-CM

## 2018-09-10 DIAGNOSIS — Z209 Contact with and (suspected) exposure to unspecified communicable disease: Secondary | ICD-10-CM | POA: Diagnosis not present

## 2018-09-10 DIAGNOSIS — R531 Weakness: Secondary | ICD-10-CM | POA: Diagnosis not present

## 2018-09-10 DIAGNOSIS — R5381 Other malaise: Secondary | ICD-10-CM | POA: Diagnosis not present

## 2018-09-10 DIAGNOSIS — Z9114 Patient's other noncompliance with medication regimen: Secondary | ICD-10-CM

## 2018-09-10 LAB — COMPREHENSIVE METABOLIC PANEL
ALT: 28 U/L (ref 0–44)
AST: 27 U/L (ref 15–41)
Albumin: 2.2 g/dL — ABNORMAL LOW (ref 3.5–5.0)
Alkaline Phosphatase: 111 U/L (ref 38–126)
Anion gap: 9 (ref 5–15)
BUN: 18 mg/dL (ref 8–23)
CO2: 22 mmol/L (ref 22–32)
Calcium: 7.7 mg/dL — ABNORMAL LOW (ref 8.9–10.3)
Chloride: 108 mmol/L (ref 98–111)
Creatinine, Ser: 1.17 mg/dL (ref 0.61–1.24)
GFR calc Af Amer: >60 mL/min (ref >60)
GFR calc non Af Amer: >60 mL/min (ref >60)
Glucose, Bld: 76 mg/dL (ref 70–99)
Potassium: 4.1 mmol/L (ref 3.5–5.1)
Sodium: 139 mmol/L (ref 135–145)
Total Bilirubin: 2.9 mg/dL — ABNORMAL HIGH (ref 0.3–1.2)
Total Protein: 5 g/dL — ABNORMAL LOW (ref 6.5–8.1)

## 2018-09-10 LAB — TROPONIN I (HIGH SENSITIVITY): Troponin I (High Sensitivity): 5 ng/L (ref <18)

## 2018-09-10 LAB — CBC WITH DIFFERENTIAL/PLATELET
Abs Immature Granulocytes: 0.03 10*3/uL (ref 0.00–0.07)
Basophils Absolute: 0 10*3/uL (ref 0.0–0.1)
Basophils Relative: 0 %
Eosinophils Absolute: 0.1 10*3/uL (ref 0.0–0.5)
Eosinophils Relative: 1 %
HCT: 28.3 % — ABNORMAL LOW (ref 39.0–52.0)
Hemoglobin: 9 g/dL — ABNORMAL LOW (ref 13.0–17.0)
Immature Granulocytes: 0 %
Lymphocytes Relative: 30 %
Lymphs Abs: 2.4 10*3/uL (ref 0.7–4.0)
MCH: 31.4 pg (ref 26.0–34.0)
MCHC: 31.8 g/dL (ref 30.0–36.0)
MCV: 98.6 fL (ref 80.0–100.0)
Monocytes Absolute: 0.5 10*3/uL (ref 0.1–1.0)
Monocytes Relative: 7 %
Neutro Abs: 5 10*3/uL (ref 1.7–7.7)
Neutrophils Relative %: 62 %
Platelets: 113 10*3/uL — ABNORMAL LOW (ref 150–400)
RBC: 2.87 MIL/uL — ABNORMAL LOW (ref 4.22–5.81)
RDW: 19.4 % — ABNORMAL HIGH (ref 11.5–15.5)
WBC: 8 10*3/uL (ref 4.0–10.5)
nRBC: 0 % (ref 0.0–0.2)

## 2018-09-10 LAB — ETHANOL: Alcohol, Ethyl (B): <10 mg/dL (ref <10)

## 2018-09-10 LAB — BRAIN NATRIURETIC PEPTIDE: B Natriuretic Peptide: 255 pg/mL — ABNORMAL HIGH (ref 0.0–100.0)

## 2018-09-10 MED ORDER — DILTIAZEM HCL 100 MG IV SOLR
5.0000 mg/h | INTRAVENOUS | Status: DC
  Administered 2018-09-10: 5 mg/h via INTRAVENOUS
  Filled 2018-09-10: qty 100

## 2018-09-10 MED ORDER — DILTIAZEM LOAD VIA INFUSION
10.0000 mg | Freq: Once | INTRAVENOUS | Status: AC
  Administered 2018-09-10: 10 mg via INTRAVENOUS
  Filled 2018-09-10: qty 10

## 2018-09-10 NOTE — ED Notes (Signed)
Pt refused to stay over night in hospital. Pt leaving [place]. MD made aware

## 2018-09-10 NOTE — ED Notes (Signed)
ED Provider at bedside. 

## 2018-09-10 NOTE — ED Triage Notes (Signed)
Pt from home. C/o of generalized weakness with extremity swelling. Catheter in left arm for possible infusion, pt is worried about infection.

## 2018-09-10 NOTE — ED Provider Notes (Signed)
[facility] EMERGENCY DEPARTMENT Provider Note   CSN: [id number] Arrival date & time: 09/10/18  1210     History   Chief Complaint Chief Complaint  Patient presents with  . Weakness    HPI Daniel Morse is a 78 y.o. male.     HPI  Pt was seen at 1225. Per pt, c/o gradual onset and worsening of persistent generalized weakness for the past several weeks. Pt also c/o bilat R>L lower legs "swelling" for the past 2 weeks, as well as his LUE "started to swell" today. Pt states hs has a PICC line placed LUE "for medicine that I'm not going to let them give me" for unknown medical condition. Pt states he "always has a high heart rate" and self discontinued his coumadin 6 to 8 months ago "because that stuff will make you bleed to death." Pt states he did not inform his PMD or Cards MD regarding stopping his coumadin. Pt states he "just came here to get this thing out of my arm." Denies fevers, no injury, no rash, no CP/palpitations, no SOB/cough, no abd pain, no N/V/D, no back pain, no focal motor weakness, no tingling/numbness in extremities.     Past Medical History:  Diagnosis Date  . Atrial flutter ([place]) 06/2010  . Blind   . Cerebrovascular disease 2008   Left CEA  . Chest pain    negative stress nuclear in 11/2002  . Chronic anticoagulation   . Dysrhythmia   . GERD (gastroesophageal reflux disease)    h/o esophagitis  . History of colonic polyps    Colonoscopy and polypectomy in 03/2011  . History of kidney stones   . Hyperlipidemia    Metabolic syndrome with increased triglycerides  . Hypertension   . Macular degeneration    legally blind  . Nephrolithiasis   . Peripheral vascular disease ([place])   . Tobacco abuse    50 pack years; 1/2 pack per day    Patient Active Problem List   Diagnosis Date Noted  . DVT (deep venous thrombosis) ([place]) 06/16/2018  . Low hemoglobin 05/18/2018  . Malnutrition of moderate degree 04/15/2018  . Warfarin toxicity 04/14/2018  . Acute  kidney failure ([place]) 04/14/2018  . 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 Hepatic steatosis 04/14/2018  . Perirectal abscess   . Family hx of colon cancer 04/19/2016  . History of colonic polyps 04/19/2016  . Hypokalemia 12/03/2012  . Peripheral vascular disease ([place]) 11/05/2012  . Tobacco abuse   . Nephrolithiasis   . Hyperlipidemia   . Hypertension   . GERD (gastroesophageal reflux disease)     Past Surgical History:  Procedure Laterality Date  . CAROTID ENDARTERECTOMY  2008   Left  . COLONOSCOPY  04/04/2011   Rogene Houston, MD;  multiple polyps ablated  . COLONOSCOPY N/A 06/12/2016   Procedure: COLONOSCOPY;  Surgeon: Rogene Houston, MD;  Location: [facility];  Service: Endoscopy;  Laterality: N/A;  1030  . CYSTOSCOPY W/ URETERAL STENT PLACEMENT     and stone extraction  . GIVENS CAPSULE STUDY N/A 05/20/2018   Procedure: GIVENS CAPSULE STUDY;  Surgeon: Rogene Houston, MD;  Location: [facility];  Service: Endoscopy;  Laterality: N/A;  7:30        Home Medications    Prior to Admission medications   Medication Sig Start Date End Date Taking? Authorizing Provider  aspirin EC 81 MG tablet Take 81 mg by mouth daily.    [provider]  diltiazem (CARDIZEM) 60 MG tablet Take 1 tablet (60 mg total) by mouth 2 (two) times daily. 05/21/18   Arnoldo Lenis, MD  famotidine (PEPCID) 20 MG tablet Take 1 tablet (20 mg total) by mouth 2 (two) times daily. 05/14/18   [name], Mechele Dawley, MD  levothyroxine (SYNTHROID, LEVOTHROID) 50 MCG tablet Take 50 mcg by mouth every morning. 03/19/17   [provider]  Metoprolol Tartrate 75 MG TABS Take 1 tablet by mouth twice daily 06/23/18   Arnoldo Lenis, MD  Specialty Vitamins Products (ICAPS LUTEIN-ZEAXANTHIN PO) Take 1 tablet by mouth 2 (two) times daily.     [provider]    Family History Family History  Problem Relation Age of Onset  . Heart attack Father   . Hypertension Sister   . Heart  attack Brother   . Colon cancer Paternal Uncle   . Heart attack Brother     Social History Social History   Tobacco Use  . Smoking status: Former Smoker    Packs/day: 0.50    Years: 50.00    Pack years: 25.00    Types: Cigarettes    Start date: 11/06/1978    Quit date: 11/05/2009    Years since quitting: 8.8  . Smokeless tobacco: Former Systems developer    Quit date: 06/01/2010  Substance Use Topics  . Alcohol use: Not Currently    Alcohol/week: 20.0 standard drinks    Types: 20 Shots of liquor per week    Comment: 1/2 gallon of vodka (as much as possible) quit 8 weeks ago  . Drug use: No     Allergies   Statins   Review of Systems Review of Systems ROS: Statement: All systems negative except as marked or noted in the HPI; Constitutional: Negative for fever and chills. ; ; Eyes: Negative for eye pain, redness and discharge. ; ; ENMT: Negative for ear pain, hoarseness, nasal congestion, sinus pressure and sore throat. ; ; Cardiovascular: Negative for chest pain, palpitations, diaphoresis, dyspnea and +peripheral edema. ; ; Respiratory: Negative for cough, wheezing and stridor. ; ; Gastrointestinal: Negative for nausea, vomiting, diarrhea, abdominal pain, blood in stool, hematemesis, jaundice and rectal bleeding. . ; ; Genitourinary: Negative for dysuria, flank pain and hematuria. ; ; Musculoskeletal: Negative for back pain and neck pain. Negative for swelling and trauma.; ; Skin: Negative for pruritus, rash, abrasions, blisters, bruising and skin lesion.; ; Neuro: +generalized weakness. Negative for headache, lightheadedness and neck stiffness. Negative for altered level of consciousness, altered mental status, extremity weakness, paresthesias, involuntary movement, seizure and syncope.       Physical Exam Updated Vital Signs BP 105/71 (BP Location: Right Arm)   Pulse (!) 120   Temp 98.1 F (36.7 C) (Oral)   Resp 19   Ht $R'5\' 6"'pZ$  (1.676 m)   Wt 86.8 kg   SpO2 100%   BMI 30.89 kg/m    Physical Exam 1230: Physical examination:  Nursing notes reviewed; Vital signs and O2 SAT reviewed;  Constitutional: Well developed, Well nourished, Well hydrated, In no acute distress; Head:  Normocephalic, atraumatic; Eyes: EOMI, PERRL, No scleral icterus; ENMT: Mouth and pharynx normal, Mucous membranes moist; Neck: Supple, Full range of motion, No lymphadenopathy; Cardiovascular: Irregular tachycardic rate and rhythm, No gallop; Respiratory: Breath sounds clear & equal bilaterally, No wheezes.  Speaking full sentences with ease, Normal respiratory effort/excursion; Chest: Nontender, Movement normal; Abdomen: Soft, Nontender, Nondistended, Normal bowel sounds; Genitourinary: No CVA tenderness; Extremities: Peripheral pulses normal, No tenderness, +3 RLE edema  foot to knee, +2 LLE edema foot to knee. No calf tenderness, +R>L calf asymmetry.  +PICC line LUE with ACE wrapped distal upper arm/proximal AC area, with edema left forearm and hand. PICC site without erythema, drainage. No soft tissue crepitus, muscles compartments soft.; Neuro: AA&Ox3, Major CN grossly intact. No facial droop. Speech clear. No gross focal motor deficits in extremities.; Skin: Color normal, Warm, Dry.; Psych:  Argumentative with ED staff.     ED Treatments / Results  Labs (all labs ordered are listed, but only abnormal results are displayed)   EKG EKG Interpretation  Date/Time:  Thursday September 10 2018 12:17:50 EDT Ventricular Rate:  128 PR Interval:    QRS Duration: 93 QT Interval:  285 QTC Calculation: 416 R Axis:   -21 Text Interpretation:  Atrial fibrillation Borderline left axis deviation Low voltage, precordial leads Anteroseptal infarct, old Repol abnrm suggests ischemia, lateral leads When compared with ECG of 06/16/2018 No significant change was found Confirmed by Francine Graven [phone]) on 09/10/2018 12:39:47 PM   Radiology   Procedures Procedures (including critical care time)  Medications Ordered in  ED Medications  diltiazem (CARDIZEM) 1 mg/mL load via infusion 10 mg (has no administration in time range)    And  diltiazem (CARDIZEM) 100 mg in dextrose 5 % 100 mL (1 mg/mL) infusion (5 mg/hr Intravenous New Bag/Given 09/10/18 1336)     Initial Impression / Assessment and Plan / ED Course  I have reviewed the triage vital signs and the nursing notes.  Pertinent labs & imaging results that were available during my care of the patient were reviewed by me and considered in my medical decision making (see chart for details).     MDM Reviewed: previous chart, nursing note and vitals Reviewed previous: ECG Interpretation: ECG    1345:  Pt argumentative with all ED staff. Stating he is "just here to get this line out." Pt informed that the ED is not the appropriate venue for this, and he will need to contact whomever ordered the PICC for the order to discontinue it. Pt arguing with ED staff regarding his HR (ie: "it's always like that!") and "I told those doctors I wasn't taking that coumadin." Pt now yelling at ED staff again that "he's leaving!"  Pt informed re: dx testing has not been resulted, including afib/RVR that was currently being controlled on IV medication (cardizem), and that I recommend waiting for his ED testing to be completed and then admission for further evaluation.  Pt refuses to wait for or receive any further ED testing or treatment, as well as admission.  I encouraged pt to stay, continues to refuse.  Pt makes his own medical decisions.  Risks of AMA explained to pt, including, but not limited to:  "Blood clots," stroke, heart attack, cardiac arrythmia ("irregular heart rate/beat"), "passing out," temporary and/or permanent disability, death.  Pt verb understanding and continues to refuse any further ED testing as well as admission, understanding the consequences of his decision.  I encouraged pt to follow up with his PMD and Cards MD tomorrow and return to the ED immediately if  he changes his mind, or for any other concerns.  Pt verb understanding, agreeable.       Final Clinical Impressions(s) / ED Diagnoses   Final diagnoses:  Painful swelling of joint    ED Discharge Orders    None       Francine Graven, DO 09/15/18 1553

## 2018-09-15 DIAGNOSIS — I776 Arteritis, unspecified: Secondary | ICD-10-CM | POA: Diagnosis not present

## 2018-09-15 DIAGNOSIS — Z5329 Procedure and treatment not carried out because of patient's decision for other reasons: Secondary | ICD-10-CM | POA: Diagnosis not present

## 2018-09-15 DIAGNOSIS — I10 Essential (primary) hypertension: Secondary | ICD-10-CM | POA: Diagnosis not present

## 2018-09-15 DIAGNOSIS — I82552 Chronic embolism and thrombosis of left peroneal vein: Secondary | ICD-10-CM | POA: Diagnosis not present

## 2018-09-15 DIAGNOSIS — R69 Illness, unspecified: Secondary | ICD-10-CM | POA: Diagnosis not present

## 2018-09-18 DIAGNOSIS — I776 Arteritis, unspecified: Secondary | ICD-10-CM | POA: Diagnosis not present

## 2018-09-18 DIAGNOSIS — Z452 Encounter for adjustment and management of vascular access device: Secondary | ICD-10-CM | POA: Diagnosis not present

## 2018-09-22 ENCOUNTER — Telehealth: Payer: Self-pay | Admitting: Cardiology

## 2018-09-22 DIAGNOSIS — R52 Pain, unspecified: Secondary | ICD-10-CM | POA: Diagnosis not present

## 2018-09-22 DIAGNOSIS — R404 Transient alteration of awareness: Secondary | ICD-10-CM | POA: Diagnosis not present

## 2018-09-22 DIAGNOSIS — R4020 Unspecified coma: Secondary | ICD-10-CM | POA: Diagnosis not present

## 2018-09-22 DIAGNOSIS — R000 Tachycardia, unspecified: Secondary | ICD-10-CM | POA: Diagnosis not present

## 2018-09-22 DIAGNOSIS — I499 Cardiac arrhythmia, unspecified: Secondary | ICD-10-CM | POA: Diagnosis not present

## 2018-09-22 NOTE — Telephone Encounter (Signed)
Patient's wife called wanting appointment with JB ASAP. States that patient is having problems and thinks the they are cardiac related. Please call patient's wife back for symptoms and concerns. / tg

## 2018-09-23 ENCOUNTER — Encounter: Payer: Self-pay | Admitting: *Deleted

## 2018-09-23 NOTE — Telephone Encounter (Signed)
Spoke with daughter who states that father had heart attack last night. Daughter states that father did c/o racing heart but denied wanted to be seen in the ED at that time. Daughter reports that he was using the restroom and was being helped back to his seat by Social services and family. Pt became weak and collapsed on walker. 911 was called.

## 2018-09-23 NOTE — Telephone Encounter (Signed)
Update   Per daughter Mr Llanas had a massive heart attack 7/21,20 ,, states that no one from the office called her back yesterday.

## 2018-10-03 DEATH — deceased

## 2018-11-18 ENCOUNTER — Ambulatory Visit: Payer: Medicare HMO | Admitting: Cardiology

## 2020-01-07 IMAGING — US ULTRASOUND SCROTUM DOPPLER COMPLETE
1 series · 13 of 25 positions shown · non-contrast
Comparison: None.

CLINICAL DATA: Left testicular swelling.

EXAM:
SCROTAL ULTRASOUND
DOPPLER ULTRASOUND OF THE TESTICLES
TECHNIQUE: Complete ultrasound examination of the testicles, epididymis, and
other scrotal structures was performed. Color and spectral Doppler
ultrasound were also utilized to evaluate blood flow to the
testicles.

[Series 1: ultrasound scrotum doppler complete · 0.06mm/px · 13 of 74 slices shown]
[im 1/74]
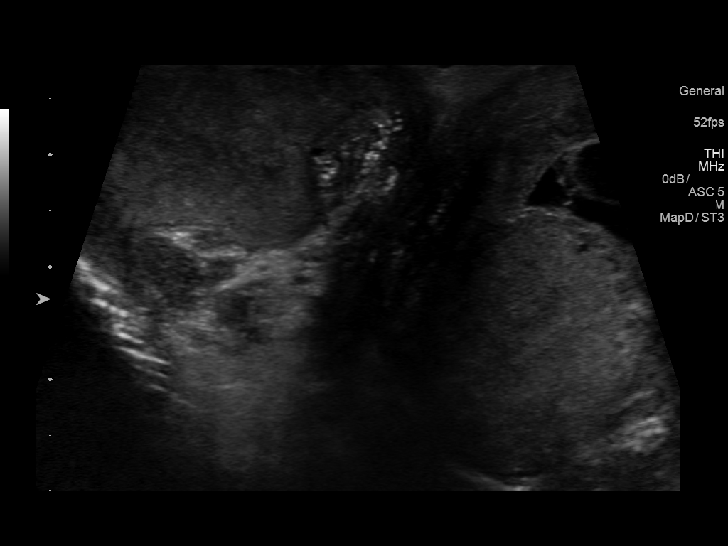
[im 7/74]
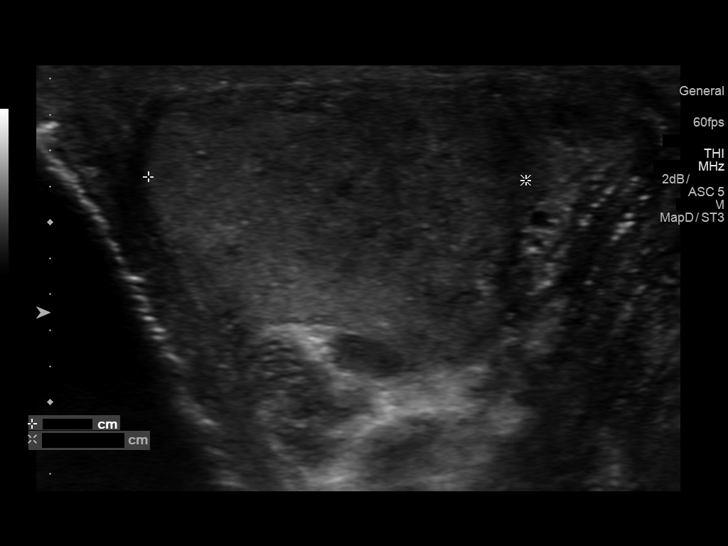
[im 13/74]
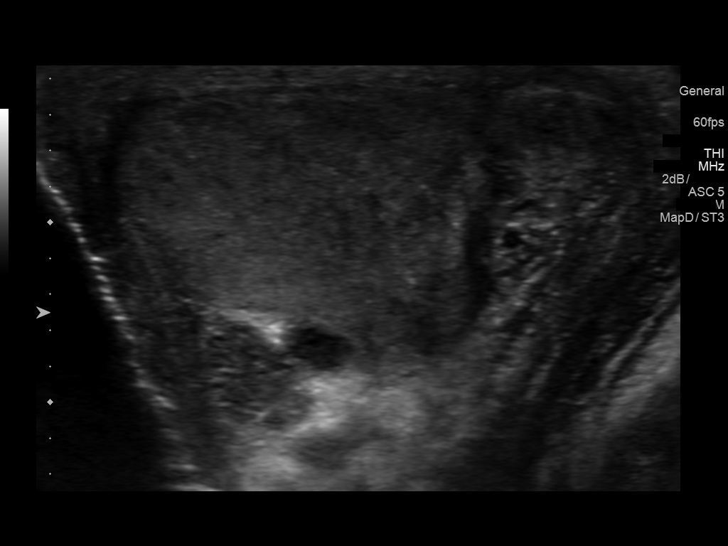
[im 19/74]
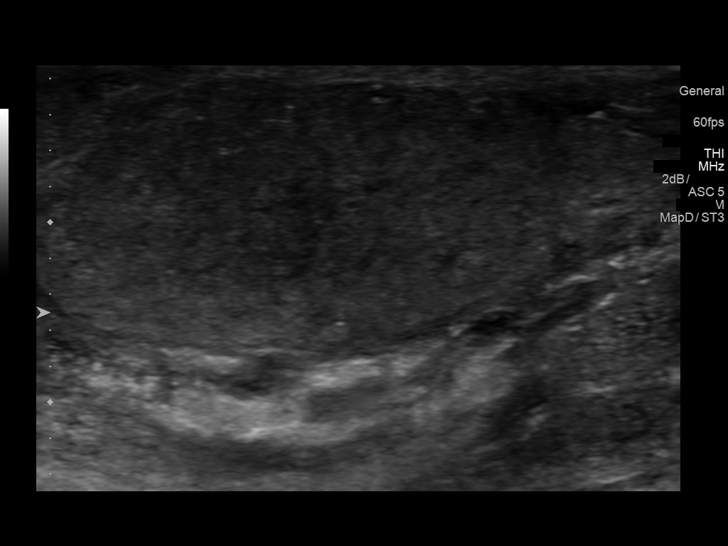
[im 25/74]
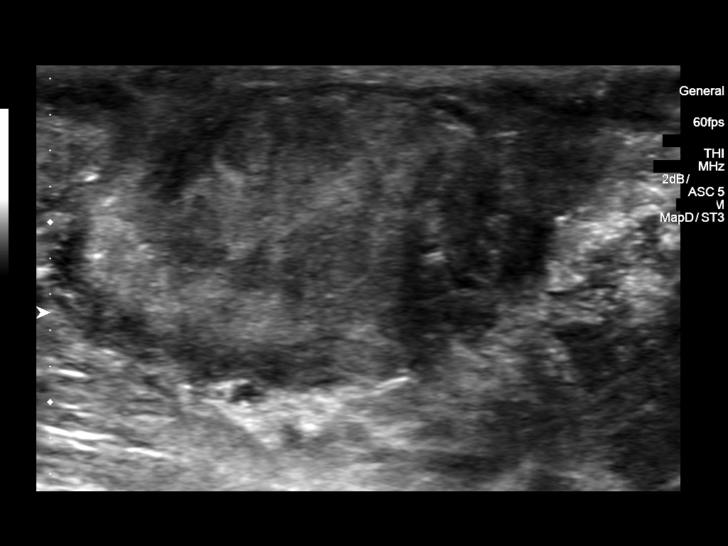
[im 31/74]
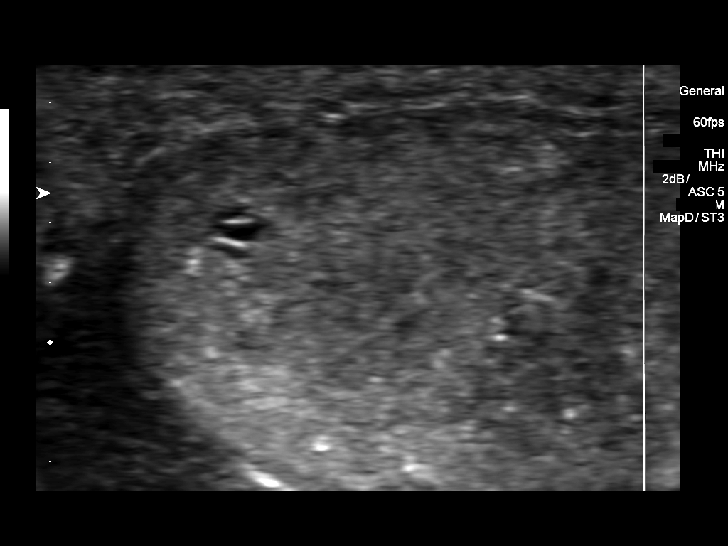
[im 37/74]
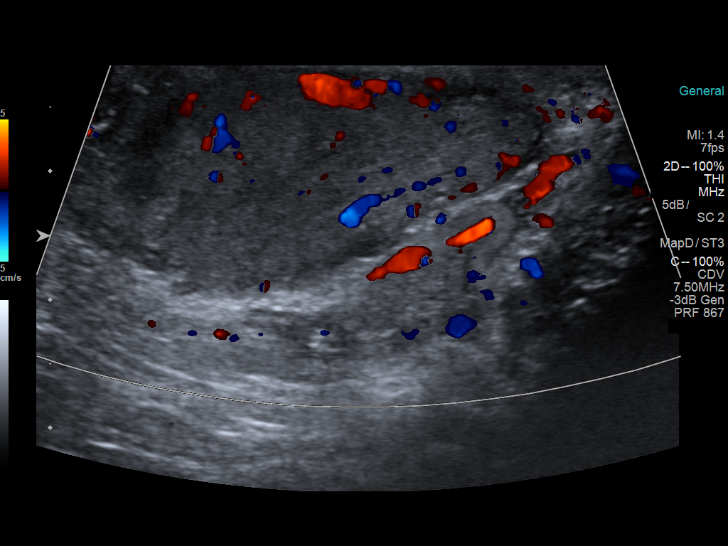
[im 43/74]
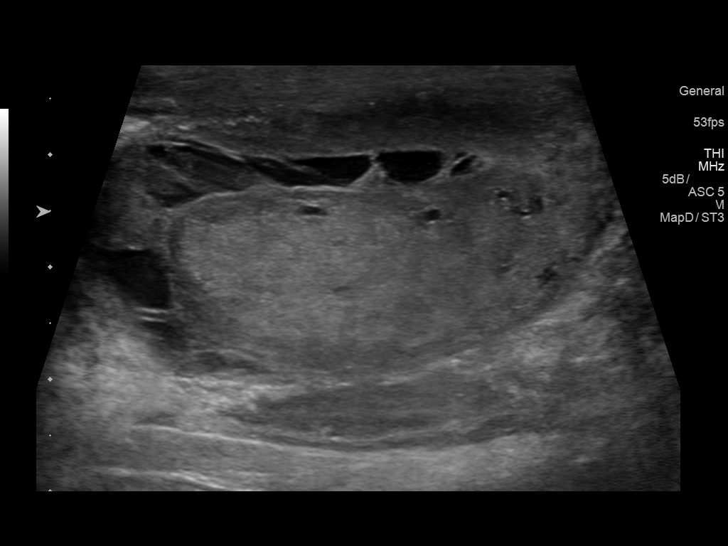
[im 49/74]
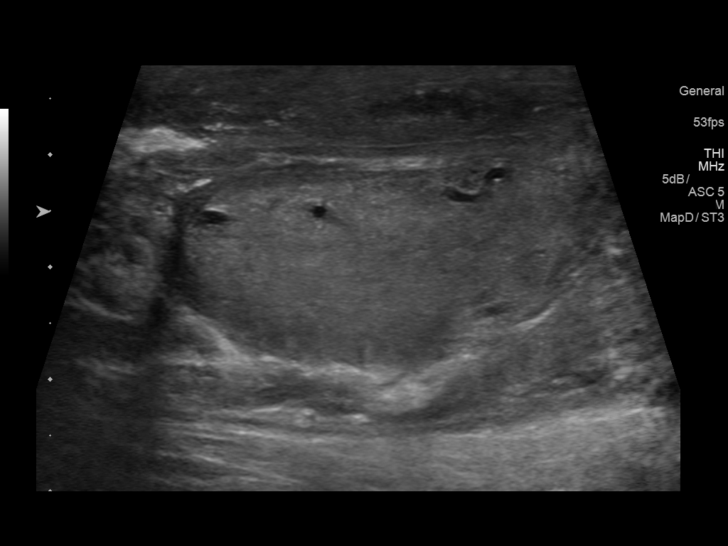
[im 55/74]
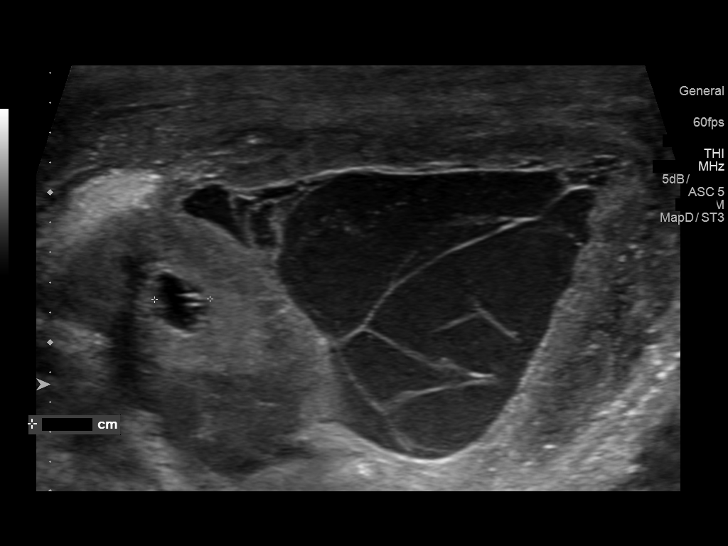
[im 61/74]
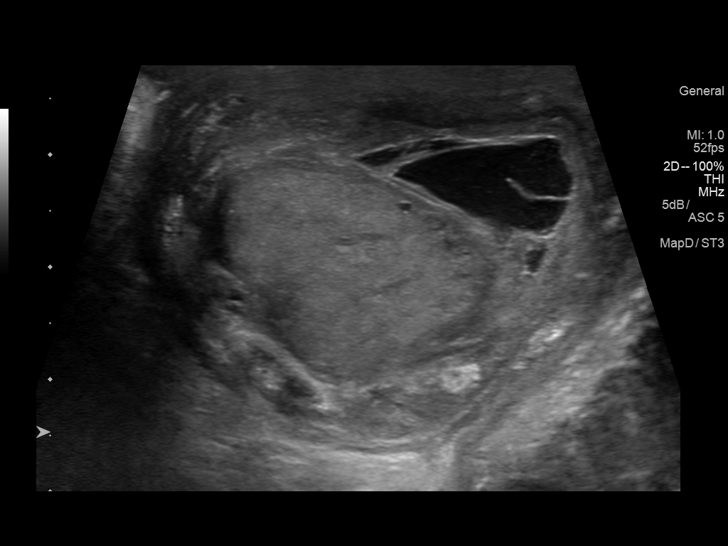
[im 67/74]
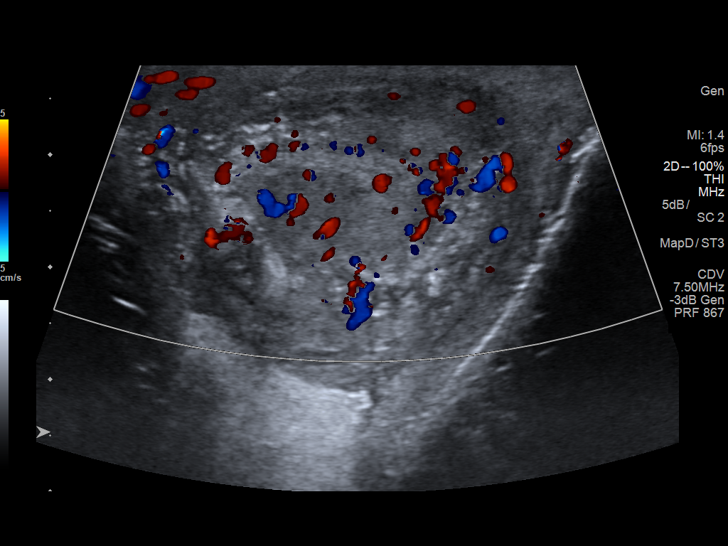
[im 74/74]
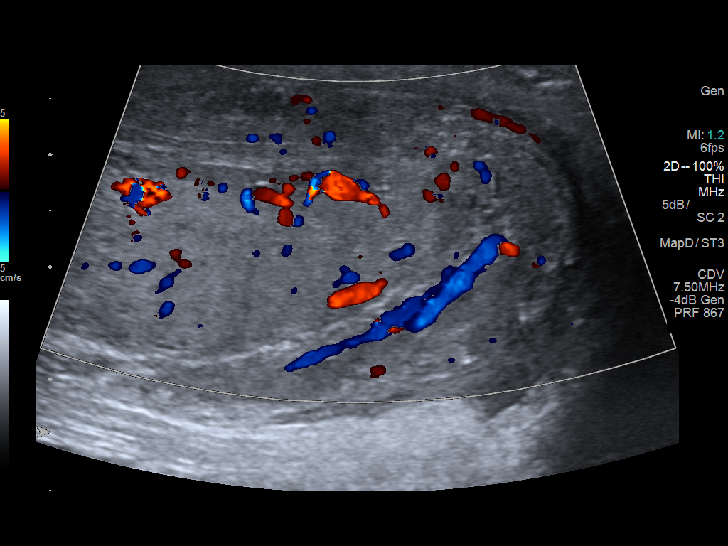

[13 of 25 positions shown; findings below may reference images not displayed]

FINDINGS: Right testicle

Measurements: 3.6 x 2.2 x 1.4 cm. No mass or microlithiasis
visualized. 3 mm cyst is noted.

Left testicle

Measurements: 4.0 x 2.1 x 1.8 cm 4 mm septated cyst is noted.. No
mass or microlithiasis visualized.

Right epididymis:  Normal in size and appearance.

Left epididymis: Increased vascularity of left epididymis is noted
suggesting epididymitis.

Hydrocele:  Large multi-septated complex left hydrocele is noted.

Varicocele:  None visualized.

Pulsed Doppler interrogation of both testes demonstrates normal low
resistance arterial and venous waveforms bilaterally.
IMPRESSION: No definite evidence of testicular torsion. Small complex cysts are
noted in both testicles.

Left epididymis is enlarged and hypervascular suggesting
epididymitis. Large multi-septated complex left hydrocele is noted
which may be inflammatory in etiology.

## 2020-04-28 IMAGING — DX DG CHEST 2V
2 series · 2 of 2 positions shown · non-contrast
Comparison: Chest x-ray

CLINICAL DATA: Dry cough 6 weeks.

EXAM:
CHEST - 2 VIEW

[chest pa]
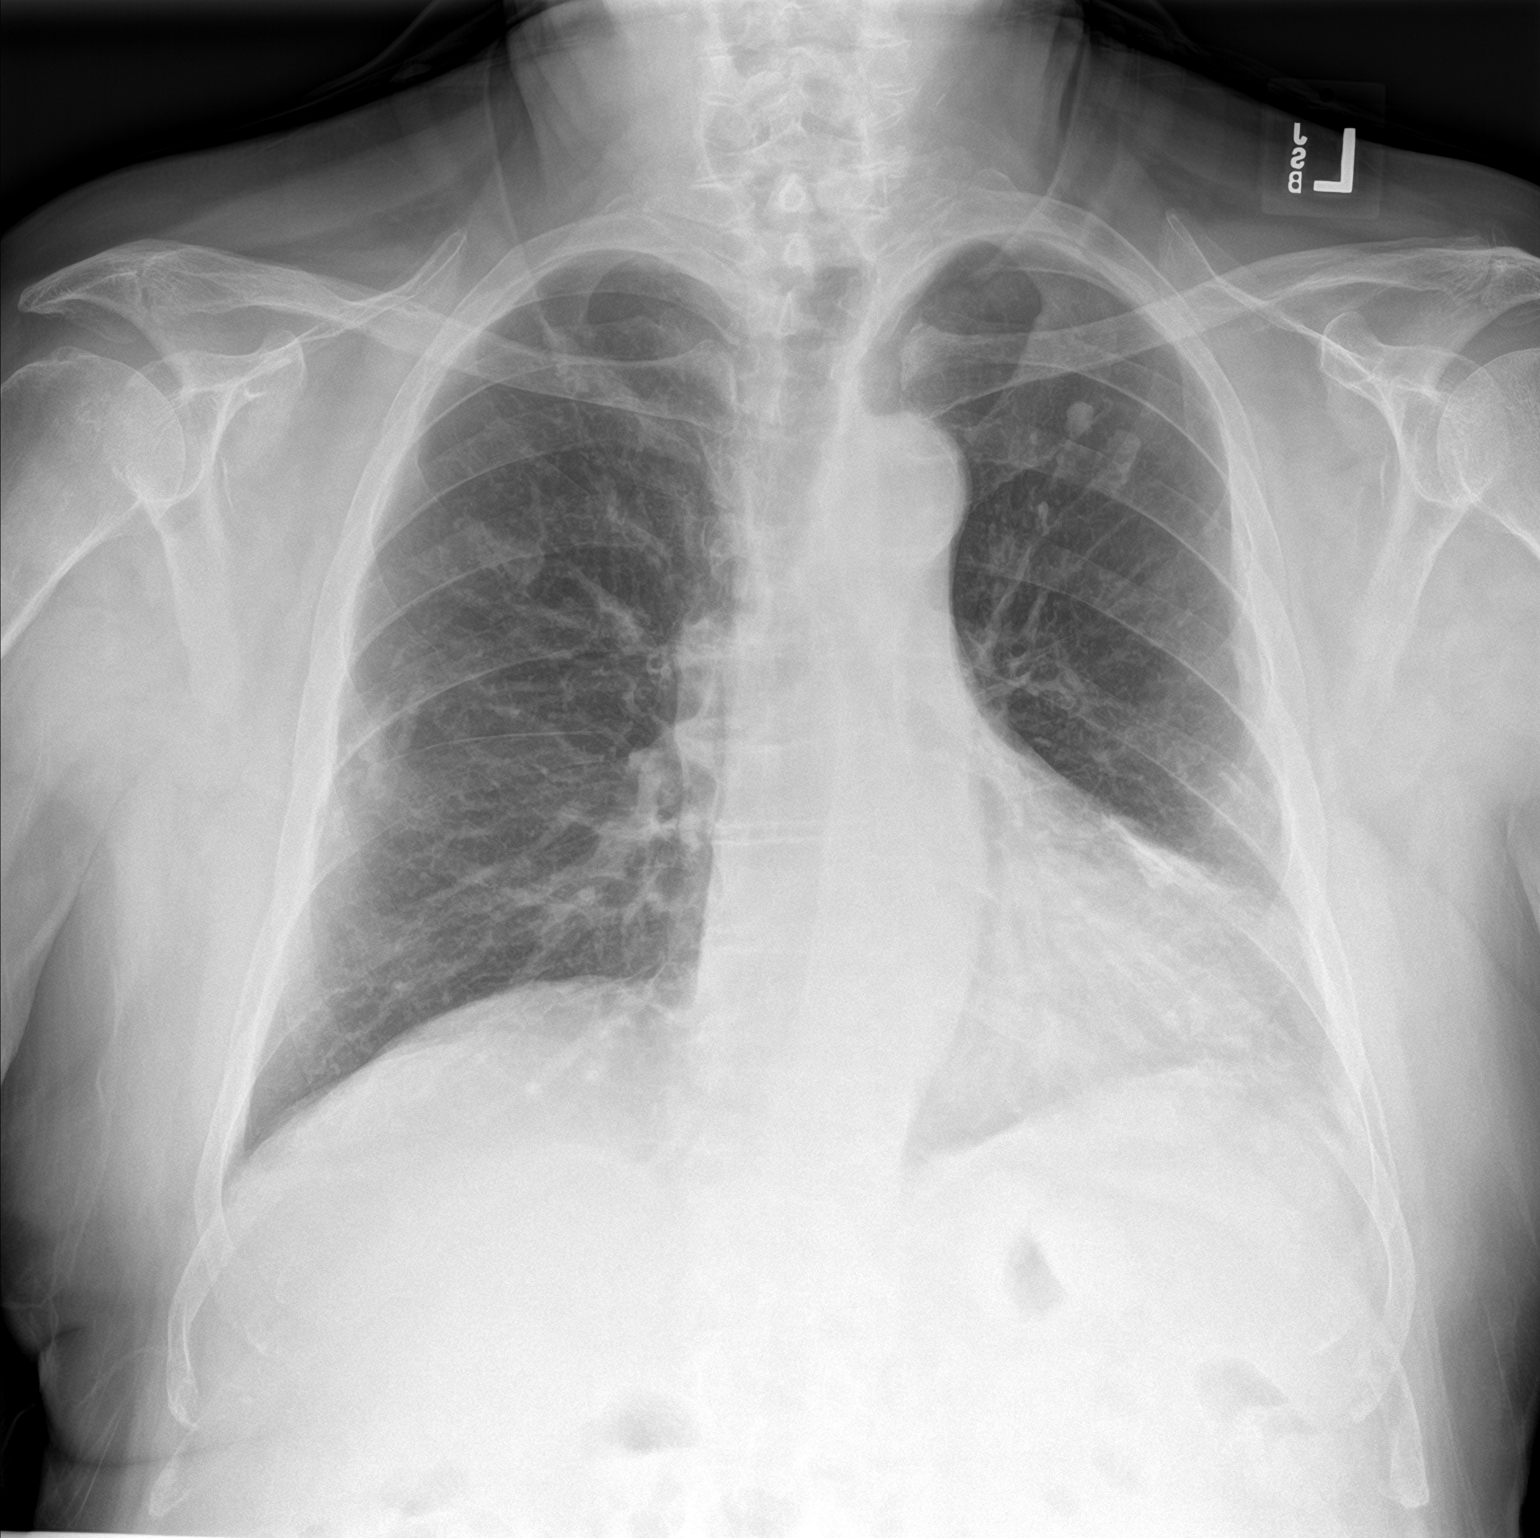

[chest lat]
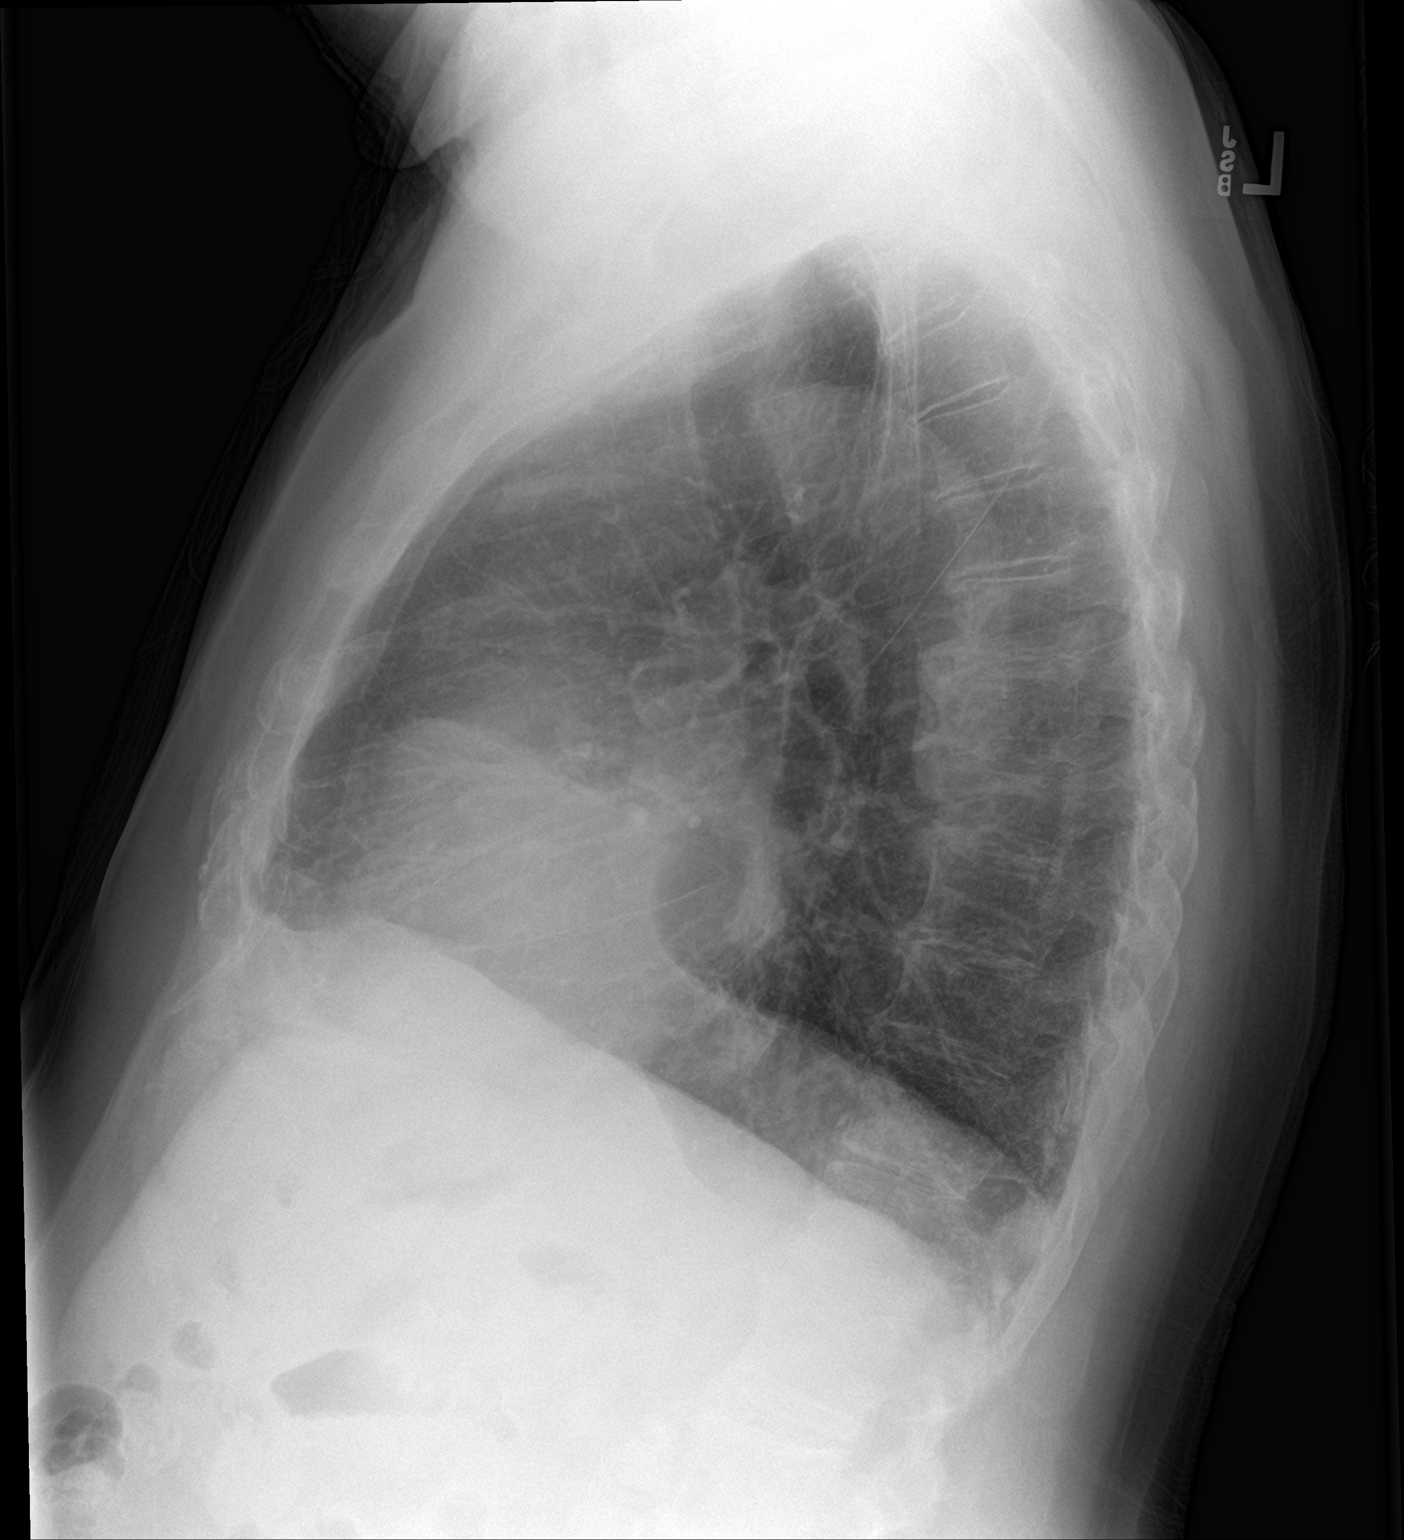

[2 of 2 positions shown; findings below may reference images not displayed]

FINDINGS: Bilateral densities are stable, presumably pleural. Nodular
densities are present upper lobes stable. Superimposed airspace
disease is present.

Heart is mildly enlarged.  There is no edema or effusion.
IMPRESSION: No active cardiopulmonary disease.

## 2020-05-06 IMAGING — CT CT RENAL STONE PROTOCOL
2 of 4 series · 16 of 46 positions shown, 18 images · non-contrast
Comparison: 02/23/2014

CLINICAL DATA: Acute renal failure

EXAM:
CT ABDOMEN AND PELVIS WITHOUT CONTRAST
TECHNIQUE: Multidetector CT imaging of the abdomen and pelvis was performed
following the standard protocol without IV contrast.

[Series 3: stone study 5.0 i30f 2 · axial · 0.90mm/px · z∈[-848,-398]mm · 13 of 98 slices shown, 15 images]
[im 4/98  soft-tissue]
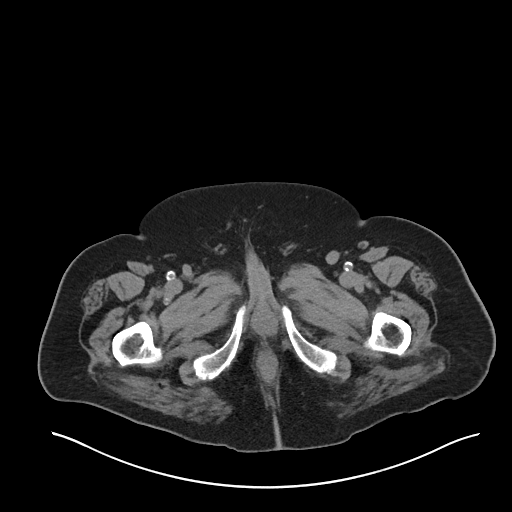
[im 4/98  bone]
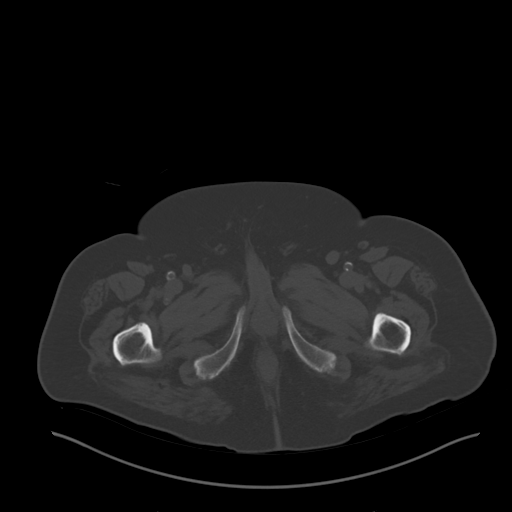
[im 12/98  soft-tissue]
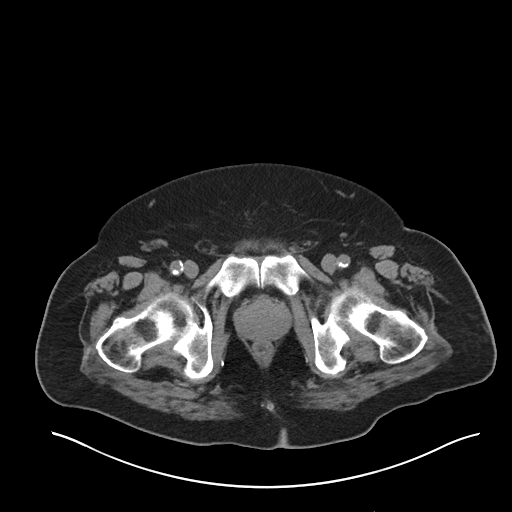
[im 20/98  soft-tissue]
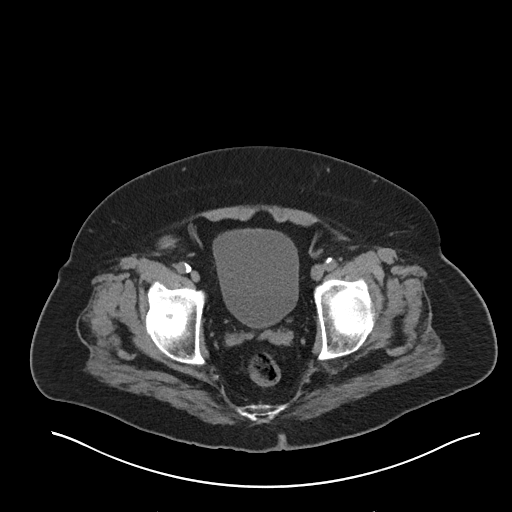
[im 28/98  soft-tissue]
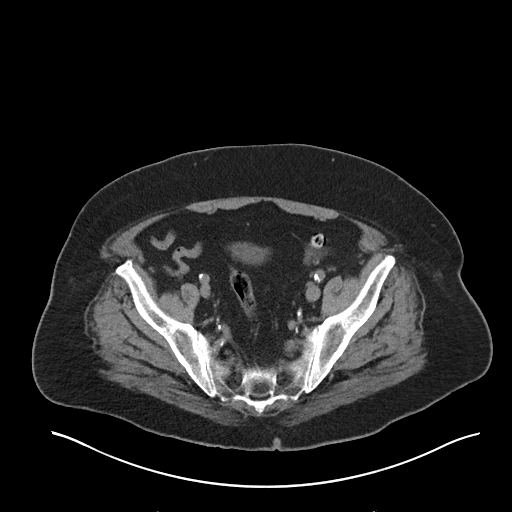
[im 35/98  soft-tissue]
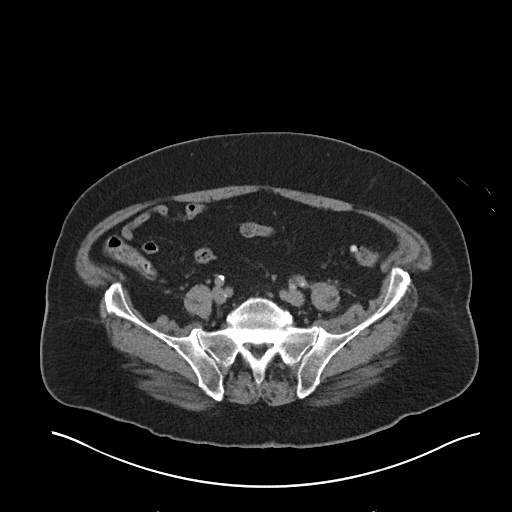
[im 43/98  soft-tissue]
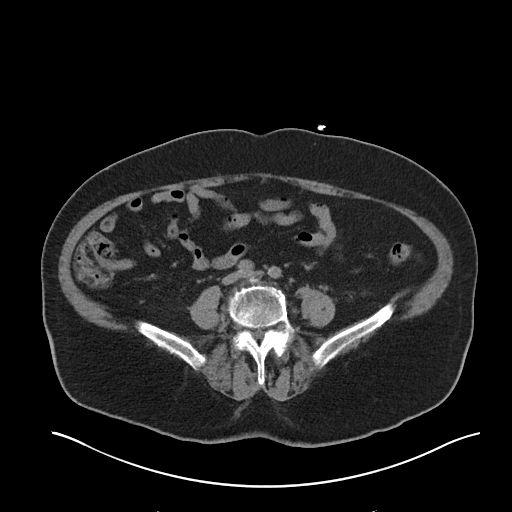
[im 51/98  soft-tissue]
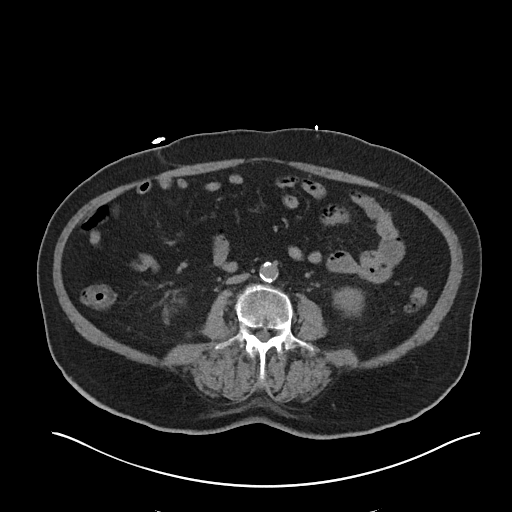
[im 55/98  soft-tissue]
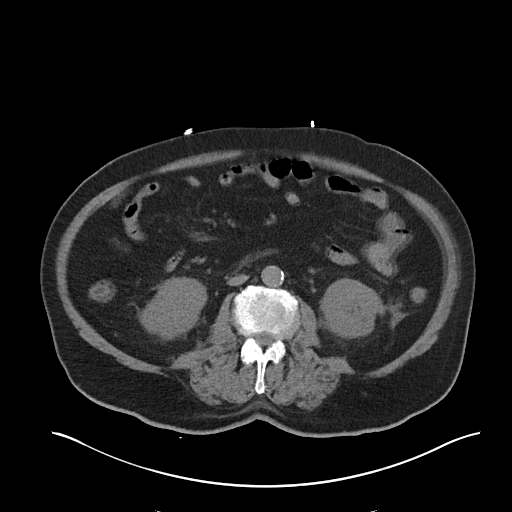
[im 63/98  soft-tissue]
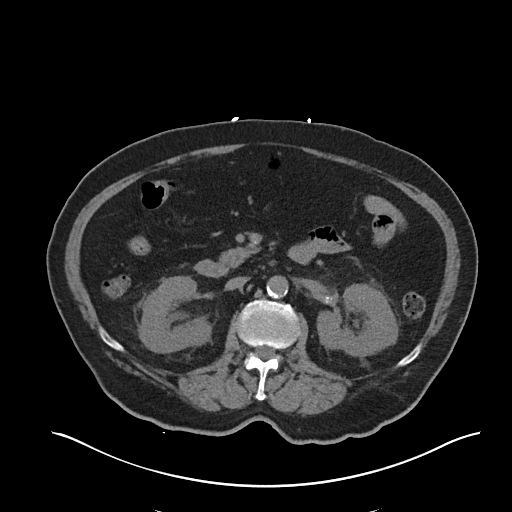
[im 63/98  bone]
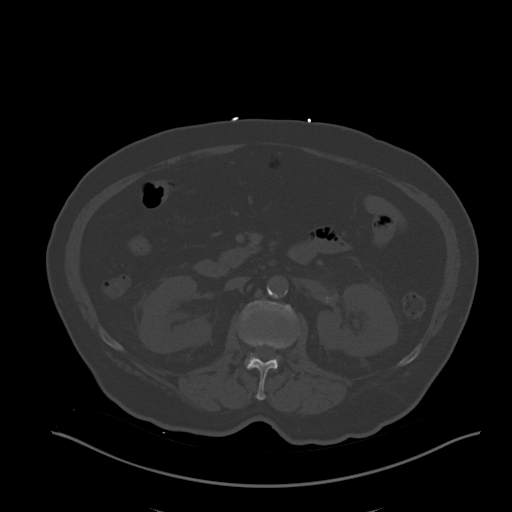
[im 70/98  soft-tissue]
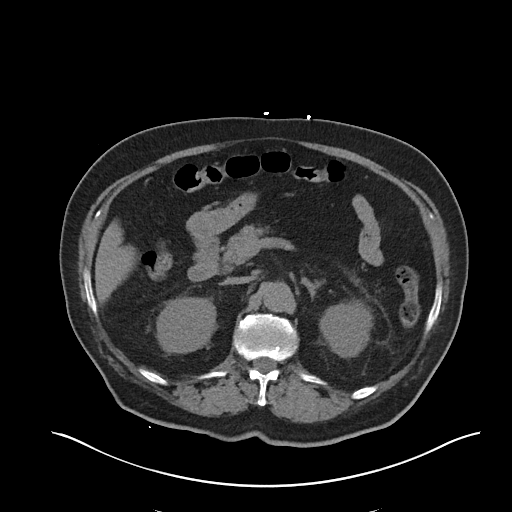
[im 78/98  soft-tissue]
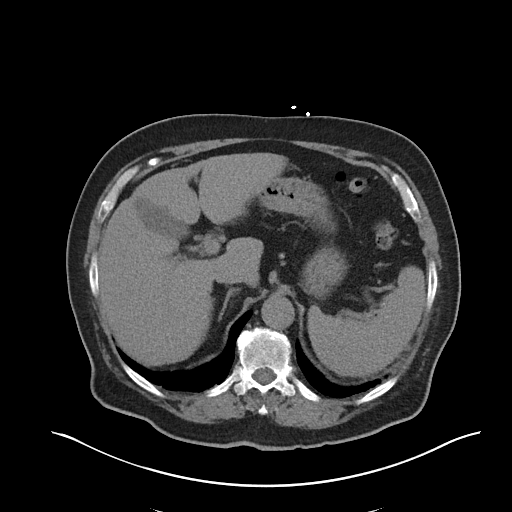
[im 86/98  soft-tissue]
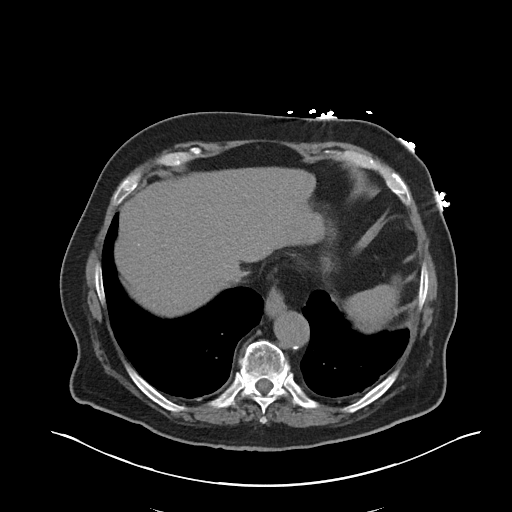
[im 94/98  soft-tissue]
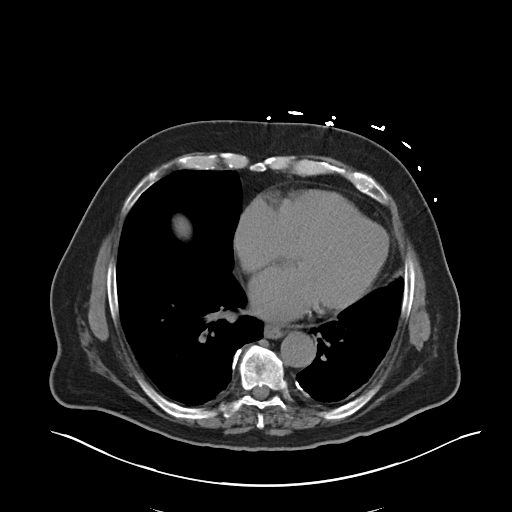

[Series 6: coronal soft tissue · coronal · 0.85mm/px · 3 of 99 slices shown]
[im 33/99  soft-tissue]
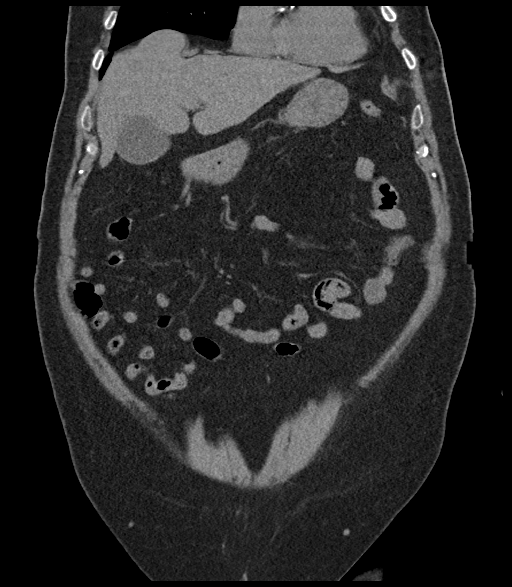
[im 44/99  soft-tissue]
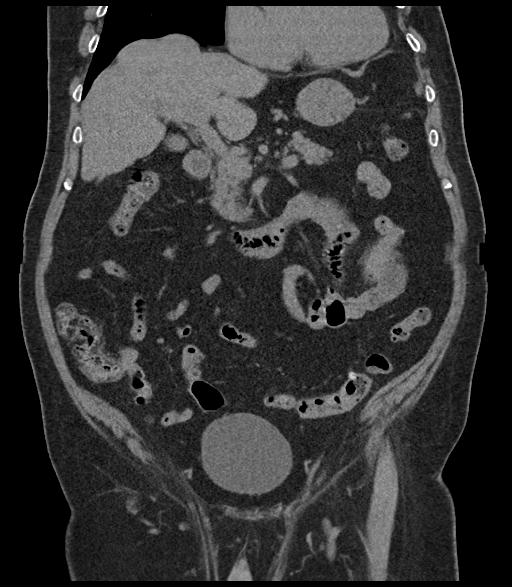
[im 55/99  soft-tissue]
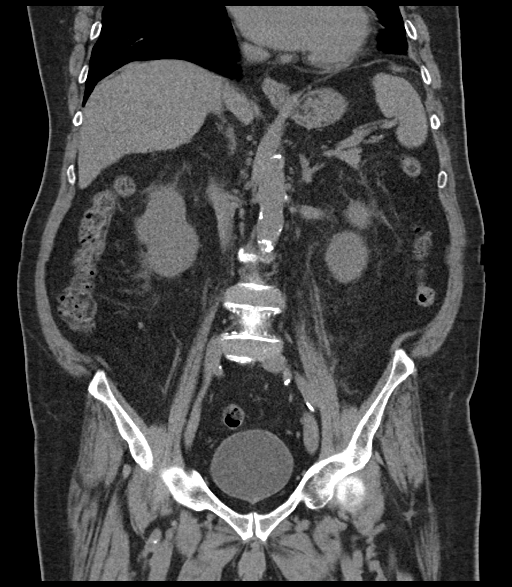

[16 of 46 positions shown; findings below may reference images not displayed]

FINDINGS: Lower chest: Calcified pleural plaque is identified at each lung
base consistent prior asbestos exposure. Coronary arteriosclerosis
of the included heart is noted without pericardial effusion or
thickening.

Hepatobiliary: Calcified granuloma in the right hepatic lobe. The
unenhanced liver is unremarkable given limitations of a noncontrast
study. No biliary dilatation is noted. The gallbladder is normal in
appearance.

Pancreas: Normal

Spleen: Normal

Adrenals/Urinary Tract: Normal bilateral adrenal glands. Nonspecific
mild perinephric fat stranding with renovascular calcifications
bilaterally. No obstructive uropathy, renal mass or nephrolithiasis.
Stable 14 mm cystic focus off the lower pole the left kidney with
associated mild renal cortical scarring.

Stomach/Bowel: Stomach is within normal limits. Small hiatal hernia.
Appendix appears normal. No evidence of bowel wall thickening,
distention, or inflammatory changes. Scattered colonic
diverticulosis without acute diverticulitis is identified.

Vascular/Lymphatic: Aortoiliac atherosclerosis. No adenopathy by CT
size criteria.

Reproductive: Unremarkable prostate and seminal vesicles.

Other: No free air or free fluid.

Musculoskeletal: No acute or significant osseous findings.
IMPRESSION: 1. Calcified pleural plaque at each lung base consistent with prior
asbestos exposure.
2. Stable 14 mm cystic focus off the lower pole the left kidney with
associated renal cortical scarring. No nephrolithiasis nor
obstructive uropathy. No CT findings to explain the patient's renal
failure.
3. Colonic diverticulosis without acute diverticulitis.

Aortic Atherosclerosis ([id number]).
